# Patient Record
Sex: Female | Born: 1960 | Race: Black or African American | Hispanic: No | Marital: Single | Smoking: Never smoker
Health system: Southern US, Community
[De-identification: ages and names within clinical notes are randomized; demographics above are authoritative.]

## PROBLEM LIST (undated history)

## (undated) DIAGNOSIS — E785 Hyperlipidemia, unspecified: Secondary | ICD-10-CM

## (undated) DIAGNOSIS — G4733 Obstructive sleep apnea (adult) (pediatric): Secondary | ICD-10-CM

## (undated) DIAGNOSIS — I350 Nonrheumatic aortic (valve) stenosis: Secondary | ICD-10-CM

## (undated) DIAGNOSIS — N189 Chronic kidney disease, unspecified: Secondary | ICD-10-CM

## (undated) DIAGNOSIS — I639 Cerebral infarction, unspecified: Secondary | ICD-10-CM

## (undated) DIAGNOSIS — K819 Cholecystitis, unspecified: Secondary | ICD-10-CM

## (undated) DIAGNOSIS — I10 Essential (primary) hypertension: Secondary | ICD-10-CM

## (undated) DIAGNOSIS — E119 Type 2 diabetes mellitus without complications: Secondary | ICD-10-CM

## (undated) DIAGNOSIS — I429 Cardiomyopathy, unspecified: Secondary | ICD-10-CM

## (undated) DIAGNOSIS — M109 Gout, unspecified: Secondary | ICD-10-CM

## (undated) DIAGNOSIS — E669 Obesity, unspecified: Secondary | ICD-10-CM

## (undated) HISTORY — PX: ABDOMINAL HYSTERECTOMY: SHX81

## (undated) HISTORY — PX: TOTAL ABDOMINAL HYSTERECTOMY: SHX209

---

## 2001-05-19 ENCOUNTER — Emergency Department (HOSPITAL_COMMUNITY): Admission: EM | Admit: 2001-05-19 | Discharge: 2001-05-20 | Payer: Self-pay | Admitting: Emergency Medicine

## 2003-03-27 ENCOUNTER — Emergency Department (HOSPITAL_COMMUNITY): Admission: EM | Admit: 2003-03-27 | Discharge: 2003-03-27 | Payer: Self-pay

## 2003-11-02 ENCOUNTER — Emergency Department (HOSPITAL_COMMUNITY): Admission: EM | Admit: 2003-11-02 | Discharge: 2003-11-02 | Payer: Self-pay | Admitting: Emergency Medicine

## 2004-03-14 ENCOUNTER — Emergency Department (HOSPITAL_COMMUNITY): Admission: EM | Admit: 2004-03-14 | Discharge: 2004-03-15 | Payer: Self-pay | Admitting: *Deleted

## 2004-12-07 ENCOUNTER — Emergency Department (HOSPITAL_COMMUNITY): Admission: EM | Admit: 2004-12-07 | Discharge: 2004-12-07 | Payer: Self-pay | Admitting: Emergency Medicine

## 2005-01-03 ENCOUNTER — Ambulatory Visit: Payer: Self-pay | Admitting: Family Medicine

## 2005-02-15 ENCOUNTER — Ambulatory Visit: Payer: Self-pay | Admitting: Family Medicine

## 2005-05-08 ENCOUNTER — Ambulatory Visit: Payer: Self-pay | Admitting: Family Medicine

## 2005-05-13 ENCOUNTER — Ambulatory Visit (HOSPITAL_COMMUNITY): Admission: RE | Admit: 2005-05-13 | Discharge: 2005-05-13 | Payer: Self-pay | Admitting: Obstetrics & Gynecology

## 2005-06-12 ENCOUNTER — Ambulatory Visit (HOSPITAL_COMMUNITY): Admission: RE | Admit: 2005-06-12 | Discharge: 2005-06-12 | Payer: Self-pay | Admitting: Family Medicine

## 2005-07-11 ENCOUNTER — Emergency Department (HOSPITAL_COMMUNITY): Admission: EM | Admit: 2005-07-11 | Discharge: 2005-07-12 | Payer: Self-pay | Admitting: *Deleted

## 2005-07-12 ENCOUNTER — Inpatient Hospital Stay (HOSPITAL_COMMUNITY): Admission: EM | Admit: 2005-07-12 | Discharge: 2005-07-14 | Payer: Self-pay | Admitting: Emergency Medicine

## 2005-07-12 ENCOUNTER — Ambulatory Visit (HOSPITAL_COMMUNITY): Admission: RE | Admit: 2005-07-12 | Discharge: 2005-07-12 | Payer: Self-pay | Admitting: *Deleted

## 2005-07-12 ENCOUNTER — Encounter (INDEPENDENT_AMBULATORY_CARE_PROVIDER_SITE_OTHER): Payer: Self-pay | Admitting: General Surgery

## 2005-09-09 ENCOUNTER — Ambulatory Visit: Payer: Self-pay | Admitting: Family Medicine

## 2005-10-28 ENCOUNTER — Emergency Department (HOSPITAL_COMMUNITY): Admission: EM | Admit: 2005-10-28 | Discharge: 2005-10-28 | Payer: Self-pay | Admitting: Emergency Medicine

## 2005-12-19 ENCOUNTER — Ambulatory Visit: Payer: Self-pay | Admitting: Family Medicine

## 2006-01-16 ENCOUNTER — Ambulatory Visit: Payer: Self-pay | Admitting: Family Medicine

## 2006-03-31 ENCOUNTER — Ambulatory Visit: Payer: Self-pay | Admitting: Family Medicine

## 2006-04-10 ENCOUNTER — Emergency Department (HOSPITAL_COMMUNITY): Admission: EM | Admit: 2006-04-10 | Discharge: 2006-04-10 | Payer: Self-pay | Admitting: Emergency Medicine

## 2006-06-09 ENCOUNTER — Ambulatory Visit: Payer: Self-pay | Admitting: Family Medicine

## 2006-06-13 ENCOUNTER — Ambulatory Visit (HOSPITAL_COMMUNITY): Admission: RE | Admit: 2006-06-13 | Discharge: 2006-06-13 | Payer: Self-pay | Admitting: Family Medicine

## 2006-07-01 ENCOUNTER — Emergency Department (HOSPITAL_COMMUNITY): Admission: EM | Admit: 2006-07-01 | Discharge: 2006-07-01 | Payer: Self-pay | Admitting: Emergency Medicine

## 2006-07-31 ENCOUNTER — Ambulatory Visit: Payer: Self-pay | Admitting: Family Medicine

## 2006-07-31 ENCOUNTER — Other Ambulatory Visit: Admission: RE | Admit: 2006-07-31 | Discharge: 2006-07-31 | Payer: Self-pay | Admitting: Family Medicine

## 2006-07-31 ENCOUNTER — Encounter: Payer: Self-pay | Admitting: Family Medicine

## 2006-07-31 LAB — CONVERTED CEMR LAB: Pap Smear: NORMAL

## 2006-10-01 ENCOUNTER — Ambulatory Visit: Payer: Self-pay | Admitting: Family Medicine

## 2007-07-04 ENCOUNTER — Emergency Department (HOSPITAL_COMMUNITY): Admission: EM | Admit: 2007-07-04 | Discharge: 2007-07-04 | Payer: Self-pay | Admitting: Emergency Medicine

## 2007-08-20 ENCOUNTER — Encounter: Payer: Self-pay | Admitting: Family Medicine

## 2007-11-25 ENCOUNTER — Ambulatory Visit: Payer: Self-pay | Admitting: Family Medicine

## 2007-11-30 ENCOUNTER — Encounter: Payer: Self-pay | Admitting: Family Medicine

## 2007-11-30 DIAGNOSIS — E119 Type 2 diabetes mellitus without complications: Secondary | ICD-10-CM | POA: Insufficient documentation

## 2007-11-30 DIAGNOSIS — E785 Hyperlipidemia, unspecified: Secondary | ICD-10-CM | POA: Insufficient documentation

## 2007-11-30 DIAGNOSIS — E669 Obesity, unspecified: Secondary | ICD-10-CM | POA: Insufficient documentation

## 2007-11-30 DIAGNOSIS — I10 Essential (primary) hypertension: Secondary | ICD-10-CM

## 2009-11-16 ENCOUNTER — Telehealth: Payer: Self-pay | Admitting: Family Medicine

## 2010-04-19 ENCOUNTER — Ambulatory Visit: Payer: Self-pay | Admitting: Cardiovascular Disease

## 2010-04-19 ENCOUNTER — Ambulatory Visit (HOSPITAL_COMMUNITY): Admission: RE | Admit: 2010-04-19 | Discharge: 2010-04-19 | Payer: Self-pay | Admitting: Gastroenterology

## 2010-09-08 ENCOUNTER — Encounter: Payer: Self-pay | Admitting: Family Medicine

## 2010-09-09 ENCOUNTER — Encounter: Payer: Self-pay | Admitting: Family Medicine

## 2010-09-18 NOTE — Letter (Signed)
Summary: RPC chart  RPC chart   Imported By: Curtis Sites 03/27/2010 11:31:48  _____________________________________________________________________  External Attachment:    Type:   Image     Comment:   External Document

## 2010-09-18 NOTE — Progress Notes (Signed)
Summary: refill  Phone Note Call from Patient   Summary of Call: would like to get refills on all her meds. [facility] Initial call taken by: Rudene Anda,  November 16, 2009 9:16 AM  Follow-up for Phone Call        patient's number has been disconnected, cannot refill any meds until patient is seen in office, it has been over a year since she was seen last Follow-up by: Adella Hare LPN,  November 16, 2009 10:02 AM

## 2011-01-04 NOTE — Discharge Summary (Signed)
NAME:  Crystal Snow, RIVIERE NO.:  [id number]   MEDICAL RECORD NO.:  [id number]          PATIENT TYPE:  INP   LOCATION:  A327                          FACILITY:  [facility]   PHYSICIAN:  Annia Friendly. Loleta Chance, MD     DATE OF BIRTH:  1961/05/26   DATE OF ADMISSION:  07/12/2005  DATE OF DISCHARGE:  11/26/2006LH                                 DISCHARGE SUMMARY   HISTORY OF PRESENT ILLNESS:  The patient was a 50 year old single, gravida  0, employee of [employer] black female from [place], [place].  She  is complaining of mid stomach pain.  History was positive for stomach pain  x24 hours confined to upper stomach.  The patient was described as sharp,  and it did not radiate through back or to shoulders.  The patient also  denied radiation of pain to chest.  The pain started around 0800 while in  bed on July 11, 2005.  The pain increased in severity around 1400 on  July 11, 2005.  The patient incurred vomiting at 1400 (vomiting was  described as orange:  The patient had eaten cheese puffs earlier).  History  was also positive for diaphoresis with vomiting and general malaise.  The  patient came to the emergency room for evaluation around 8 p.m. on July 11, 2005.  The patient was treated in the emergency room with IM medication  and IV fluids.  She was scheduled for ultrasound of her abdomen on the  morning of July 12, 2005.  Ultrasound was read as abnormal gallbladder  wall thickness, 5.5 mm stone and a Murphy's sign consistent with acute  cholecystitis, fatty liver and no biliary dilatation.   PAST MEDICAL HISTORY:  Hypertension.   ALLERGIES:  No known drug allergies.   HABITS:  Negative for tobacco, ethanol and street drugs.   PAST SURGICAL HISTORY:  Hysterectomy approximately 10 years ago secondary to  fibroids.   FAMILY HISTORY:  Mother living at age 27 with history of diabetes mellitus  and hypertension.  Father deceased at age 33 secondary to  complications of  brain disorder.  Three sisters living at age 36 good health, age 24 history  of hypertension, age 54 with history of hypertension.  Four brothers living  at age 75 good healthy, 22 good health, age 3 good health, age 42 good  health.   PROBLEMS:  Acute abdominal pain secondary to acute cholecystitis and  cholelithiasis.   PHYSICAL EXAMINATION:  GENERAL APPEARANCE:  A middle aged, overweight,  medium height, black female in no apparent respiratory distress.  VITAL SIGNS:  Temperature 98.1, pulse 78, respirations 20, blood pressure  126/73.  HEENT:  Sclerae were white.  LUNGS:  Clear.  HEART:  Audible S1, S2 without murmur.  Rhythm was regular in rate within  normal limits.  ABDOMEN:  Obese with hypoactive bowel sounds.  Old healed mid hypogastric  surgical scar.  Soft with positive mid epigastric tenderness and right upper  quadrant tenderness on palpation.  No palpable mass or organomegaly.   LABORATORY DATA:  On admission, white count 12,400, hemoglobin 12.9,  hematocrit 37.5, platelets 372,000.  Sodium 136, potassium 2.9, chloride  102, CO2 27, glucose 112, BUN 6, creatinine 1.0, calcium 8.8, serum HCG  negative.   PHYSICIAN:  The patient was evaluated by Dr. Sherryl Barters on the day of  admission.   PROBLEMS:  #1 - CHOLECYSTITIS AND CHOLELITHIASIS.  The patient had surgery  performed on July 12, 2005, by Dr. Maggie Schwalbe (laparoscopic  cholecystectomy) without complications.  The patient's postoperative course  was benign.  She was tolerating a regular diet at the time of discharge.  Her wound site demonstrated no active drainage.  She was alert and oriented  to person, place and time at time of discharge.  She was discharged to her  home on July 14, 2005.   #2 - HYPOKALEMIA.  Serum potassium on admission was 2.9.  Other electrolytes  on admission were as follows:  Sodium 136, chloride 102, CO2 27.  BUN 6,  creatinine 1.0.  The patient was treated with  IV potassium initially on  admission.  Repeat serum potassium was 3.6 on July 13, 2005.   #3 - HYPERTENSION.  Blood pressure on admission was 126/73.  Heart exam  revealed audible S1, S2 without murmur.  Rhythm was regular and rate within  normal limits.  The patient was treated with low sodium diet.  Antihypertensive medication was not required during this hospitalization.  However, the patient's antihypertensive medication will be restarted at the  discretion of her family doctor.  Last blood pressure reading on July 14, 2005, was 117/77.  Heart exam was within normal limits.  Lungs were  clear.  Extremities demonstrated no edema.  The patient had no complaining  of chest pain or shortness of breath.   #4 - ACUTE URINARY TRACT INFECTION SECONDARY TO E. COLI.  The patient was on  antibiotics (Levaquin) during this hospitalization.  The organisms were  sensitive to Levaquin.  The patient was discharged home on antibiotics based  on sensitivities.   DISCHARGE INSTRUCTIONS:  1.  Diet:  No added salt.  2.  Activity:  No driving x1 week.  No lifting x2 weeks.   MEDICATIONS:  1.  Ampicillin 500 mg one tablet q.i.d.  2.  Metoclopramide 10 mg one tablet 30 minutes before meals and bedtime.  3.  Acetaminophen/codeine #3 two tablets q.4-6 h p.r.n. pain.  4.  Potassium chloride  20 mEq one tablet p.o. daily.   FOLLOWUP:  Follow up in the office in two weeks.   FINAL PRIMARY DIAGNOSES:  1.  Acute abdominal pain secondary to cholecystitis and cholelithiasis.  2.  Hypokalemia.  3.  Acute urinary tract infection.   SECONDARY DIAGNOSIS:  Hypertension.      Annia Friendly. Loleta Chance, MD  Electronically Signed     GKH/MEDQ  D:  07/15/2005  T:  07/15/2005  Job:  [id number]

## 2011-01-04 NOTE — Op Note (Signed)
NAME:  Crystal Snow, PITZER         ACCOUNT NO.:  [id number]   MEDICAL RECORD NO.:  [id number]          PATIENT TYPE:  INP   LOCATION:  A327                          FACILITY:  [facility]   PHYSICIAN:  Dirk Dress. Katrinka Blazing, M.D.   DATE OF BIRTH:  [birth year]   DATE OF PROCEDURE:  DATE OF DISCHARGE:                                 OPERATIVE REPORT   PREOPERATIVE DIAGNOSIS:  Acute cholecystitis/cholelithiasis.   POSTOPERATIVE DIAGNOSIS:  Acute cholecystitis/cholelithiasis.   PROCEDURE:  Laparoscopic cholecystectomy.   SURGEON:  Dirk Dress. Katrinka Blazing, M.D.   DESCRIPTION:  Under general anesthesia, the patient's abdomen was prepped  and draped in a sterile field.  Examination of the abdomen revealed that a  midline incision extended above the umbilicus.  A Varies needle was  inserted, but there was some concern there was viscera beneath the fascia.  The fascia was therefore opened in the midline, and a blunt trocar was  placed.  The abdomen was insufflated with 4 liters of CO2.  Laparoscope was  placed.  An acutely inflamed, thickened gallbladder was noted.  The patient  was placed in reverse Trendelenburg position.  Under videoscopic guidance, a  10 mm port and two 5 mm ports were placed in the right subcostal region.  The gallbladder was grasped in position.  The cystic duct and cystic  arteries were dissected.  There was a small anterior cystic artery that was  dissected, clipped close to the gallbladder and divided.  The cystic duct  was thickened, and it was followed directly to the gallbladder, where in the  infundibulum of the gallbladder, there was a large stone obstructing the  cystic duct.  The dissection was carried up to this point.  The duct was  clipped with five clips and divided.  There was a large cystic artery branch  posteriorly.  It was followed to the posterior wall of the gallbladder.  There was no smaller branch that went to the gallbladder.  It was clipped  close to the  gallbladder with four clips and divided.  The gallbladder was  then placed on the stretch.  The wall of the gallbladder was extremely  edematous.  Using electrocautery, the gallbladder was then separated from  the intrahepatic space with some difficulty.  Because of drainage, it was  felt that the area would need to be drained.  There was some bleeding in the  bed that was cauterized, then clamped.  Irrigation was carried out.  The  gallbladder was placed in an EndoCatch device and retrieved.  Once this was  done, the right upper quadrant medial port was closed with an EndoClose  device using 0  Vicryl.  The supraumbilical port was closed with interrupted 0 Vicryl.  Skin  was closed with staples.  Dressings were placed.  Patient tolerated the  procedure well.  She was awakened from anesthesia and transferred to a bed  and taken to the post anesthesia care unit for further monitoring.      Dirk Dress. Katrinka Blazing, M.D.  Electronically Signed     LCS/MEDQ  D:  07/12/2005  T:  07/12/2005  Job:  [id number]  cc:   Milus Mallick. Lodema Hong, M.D.  Fax: [phone]

## 2011-01-04 NOTE — H&P (Signed)
NAME:  Crystal Snow, Crystal Snow NO.:  [id number]   MEDICAL RECORD NO.:  [id number]          PATIENT TYPE:  INP   LOCATION:  A327                          FACILITY:  [facility]   PHYSICIAN:  Annia Friendly. Loleta Chance, MD     DATE OF BIRTH:  1961/08/18   DATE OF ADMISSION:  07/12/2005  DATE OF DISCHARGE:  LH                                HISTORY & PHYSICAL   IDENTIFYING DATA:  The patient is a 50 year old, single, gravida 0, employee  of [employer], black female from [place], [place].   CHIEF COMPLAINT:  Mid upper stomach pain x24 hours.   HISTORY OF PRESENT ILLNESS:  The pain is confined to the upper stomach.  The  pain is described as sharp and it does not radiate through back to shoulders  or to chest.  The pain started around 0800 while in bed on July 11, 2005.  The pain increased in severity around 1400 on July 11, 2005.  The  patient incurred vomiting also at 1400 (vomitus is described as orange, the  patient ate cheese puff earlier) greater than five.  History is also  positive for diaphoresis with vomiting and general malaise.  The patient  came to the emergency room for evaluation around 8 p.m. on July 11, 2005.  The patient was treated in the emergency room with IM medication and  IV fluids.  She was scheduled for an ultrasound of her abdomen on the  morning of July 12, 2005.  Ultrasound abdomen was read as abnormal  gallbladder wall thickness, a 5.5-mm stones and a Murphy sign consistent  with acute cholecystitis, fatty liver, no biliary dilatation.   MEDICAL HISTORY:  Positive for hypertension.  Medical history is negative  for diabetes, tuberculosis, cancer, sickle cell, asthma, seizure disorder.   PRESCRIBED MEDICATIONS:  On admission, Triamterene/HCTZ 75/50, one tablet  p.o. every day.   The patient is not allergic to any known medications.   HABITS:  Negative for tobacco, ethanol, and street drugs.   PAST MEDICAL HISTORY:  Positive  hospitalization for hysterectomy  approximately ten years ago secondary to fibroids.   FAMILY HISTORY:  Revealed mother living, age 80, with a history of diabetes  mellitus and hypertension.  Father deceased, age 7, secondary to  complications of brain disorder.  Three sisters living, age 61 - good  health, age 70 with a history of hypertension, and age 71 with a history of  hypertension.  Four brothers living, age 63 - good health, age 85 - good  health, age 30 - good health, and age 48 - good health.   REVIEW OF SYSTEMS:  Positive for chronic left ankle pain and increased  frequency of urination less than 24 hours without pain.  Review of systems  is negative for headache, epistaxis, chest pain, shortness of breath,  dysphagia, chronic cough, hemoptysis, edema of the legs, melena,  hematochezia, weight loss, dysphagia, palpitations, vaginal discharge,  vaginal itching, and vaginal bleeding.   PHYSICAL EXAMINATION:  GENERAL APPEARANCE:  Revealed a middle-aged,  overweight, medium height, black female in no apparent respiratory distress.  VITAL SIGNS:  Temperature is  98.1, pulse 78, respirations 20, blood pressure  126/73.  HEENT:  Head:  Normocephalic.  Ears:  Normal auricles.  External canals  patent.  Tympanic membranes pearly gray.  Eyes:  Lids negative for ptosis.  Sclerae are white.  Pupils round, equal, and reactive to light.  Extraocular  movement intact.  Nose:  Negative for discharge.  Mouth:  Positive missing  teeth.  Remaining dentition fair with cavity.  No bleeding gums.  No oral  lesions.  Posterior pharynx benign.  NECK:  Negative for lymphadenopathy or thyromegaly.  Supraclavicular space:  No palpable nodes.  LUNGS:  Clear.  HEART:  Audible S1 S2 without murmur.  Regular rate and rhythm.  BREASTS:  Large.  No skin changes.  No nodules on palpation.  Nipple erect  without discharge.  Inferior aspect of breast positive for mild maceration  bilaterally without  erythema.  ABDOMEN:  Obese, hypoactive bowel sounds.  Positive old healed mid  hypogastric surgical scar.  Soft.  Positive for mid epigastric and right  upper quadrant tenderness.  No palpable mass or organomegaly.  PELVIC:  Deferred.  RECTAL:  Deferred.  EXTREMITIES:  No edema.  No joint swelling.  No joint redness.  No joint  hotness.  No tibial edema.  Palpable dorsalis pedis bilaterally.  NEUROLOGIC:  Alert and oriented  to person, place and time.  Cranial nerves  II-XII appeared intact.   LABORATORY:  White count 12,400, hemoglobin 12.9, hematocrit 37.5, platelets  372,000.  Sodium 136, potassium 2.9, chloride 102, CO2 27, glucose 112, BUN  6, creatinine 1.0, calcium 8.8.  Serum HCG negative.   IMPRESSION:  1.  Primary abdominal pain secondary to cholelithiasis and cholecystitis.  2.  Hypokalemia.   SECONDARY DIAGNOSES:  1.  Hypertension.  2.  Surgical menopause.   PLAN:  1.  IV fluids.  2.  NPO.  3.  Potassium supplement IV.  4.  Analgesia for pain.  5.  Removal of gallbladder by Dr. Maggie Schwalbe, after evaluation by him.  6.  Urinalysis.  7.  Other supportive measures.  8.  Antihypertensive medication as needed.      Annia Friendly. Loleta Chance, MD  Electronically Signed    GKH/MEDQ  D:  07/12/2005  T:  07/12/2005  Job:  [id number]

## 2012-04-01 ENCOUNTER — Other Ambulatory Visit (HOSPITAL_COMMUNITY): Payer: Self-pay | Admitting: Internal Medicine

## 2012-04-01 DIAGNOSIS — Z139 Encounter for screening, unspecified: Secondary | ICD-10-CM

## 2012-04-06 ENCOUNTER — Ambulatory Visit (HOSPITAL_COMMUNITY): Payer: Self-pay

## 2012-07-06 ENCOUNTER — Ambulatory Visit (HOSPITAL_COMMUNITY)
Admission: RE | Admit: 2012-07-06 | Discharge: 2012-07-06 | Disposition: A | Discharge status: Home / Self Care | Payer: BC Managed Care – PPO | Source: Ambulatory Visit | Attending: Internal Medicine | Admitting: Internal Medicine

## 2012-07-06 DIAGNOSIS — Z1231 Encounter for screening mammogram for malignant neoplasm of breast: Secondary | ICD-10-CM | POA: Insufficient documentation

## 2012-07-06 DIAGNOSIS — Z139 Encounter for screening, unspecified: Secondary | ICD-10-CM

## 2013-09-21 ENCOUNTER — Encounter: Payer: Self-pay | Admitting: *Deleted

## 2013-09-21 ENCOUNTER — Other Ambulatory Visit: Payer: Self-pay | Admitting: Obstetrics & Gynecology

## 2014-03-18 ENCOUNTER — Emergency Department (HOSPITAL_COMMUNITY)
Admission: EM | Admit: 2014-03-18 | Discharge: 2014-03-18 | Disposition: A | Discharge status: Home / Self Care | Payer: BC Managed Care – PPO | Attending: Emergency Medicine | Admitting: Emergency Medicine

## 2014-03-18 ENCOUNTER — Encounter (HOSPITAL_COMMUNITY): Payer: Self-pay | Admitting: Emergency Medicine

## 2014-03-18 ENCOUNTER — Emergency Department (HOSPITAL_COMMUNITY): Payer: BC Managed Care – PPO

## 2014-03-18 DIAGNOSIS — E669 Obesity, unspecified: Secondary | ICD-10-CM | POA: Insufficient documentation

## 2014-03-18 DIAGNOSIS — R209 Unspecified disturbances of skin sensation: Secondary | ICD-10-CM | POA: Insufficient documentation

## 2014-03-18 DIAGNOSIS — E876 Hypokalemia: Secondary | ICD-10-CM | POA: Insufficient documentation

## 2014-03-18 DIAGNOSIS — Z8673 Personal history of transient ischemic attack (TIA), and cerebral infarction without residual deficits: Secondary | ICD-10-CM | POA: Insufficient documentation

## 2014-03-18 DIAGNOSIS — E119 Type 2 diabetes mellitus without complications: Secondary | ICD-10-CM | POA: Insufficient documentation

## 2014-03-18 DIAGNOSIS — I10 Essential (primary) hypertension: Secondary | ICD-10-CM | POA: Insufficient documentation

## 2014-03-18 HISTORY — DX: Type 2 diabetes mellitus without complications: E11.9

## 2014-03-18 HISTORY — DX: Essential (primary) hypertension: I10

## 2014-03-18 LAB — CBC WITH DIFFERENTIAL/PLATELET
BASOS PCT: 1 % (ref 0–1)
Basophils Absolute: 0 10*3/uL (ref 0.0–0.1)
Eosinophils Absolute: 0.1 10*3/uL (ref 0.0–0.7)
Eosinophils Relative: 2 % (ref 0–5)
HCT: 37.9 % (ref 36.0–46.0)
Hemoglobin: 12.6 g/dL (ref 12.0–15.0)
Lymphocytes Relative: 48 % — ABNORMAL HIGH (ref 12–46)
Lymphs Abs: 3.5 10*3/uL (ref 0.7–4.0)
MCH: 26.8 pg (ref 26.0–34.0)
MCHC: 33.2 g/dL (ref 30.0–36.0)
MCV: 80.6 fL (ref 78.0–100.0)
MONO ABS: 0.3 10*3/uL (ref 0.1–1.0)
Monocytes Relative: 5 % (ref 3–12)
NEUTROS PCT: 44 % (ref 43–77)
Neutro Abs: 3.2 10*3/uL (ref 1.7–7.7)
PLATELETS: 292 10*3/uL (ref 150–400)
RBC: 4.7 MIL/uL (ref 3.87–5.11)
RDW: 14.3 % (ref 11.5–15.5)
WBC: 7.2 10*3/uL (ref 4.0–10.5)

## 2014-03-18 LAB — COMPREHENSIVE METABOLIC PANEL
ALBUMIN: 3.6 g/dL (ref 3.5–5.2)
ALK PHOS: 66 U/L (ref 39–117)
ALT: 16 U/L (ref 0–35)
AST: 17 U/L (ref 0–37)
Anion gap: 15 (ref 5–15)
BUN: 10 mg/dL (ref 6–23)
CALCIUM: 9 mg/dL (ref 8.4–10.5)
CO2: 27 mEq/L (ref 19–32)
Chloride: 101 mEq/L (ref 96–112)
Creatinine, Ser: 0.73 mg/dL (ref 0.50–1.10)
GFR calc Af Amer: >90 mL/min (ref >90)
GFR calc non Af Amer: >90 mL/min (ref >90)
GLUCOSE: 141 mg/dL — AB (ref 70–99)
POTASSIUM: 2.9 meq/L — AB (ref 3.7–5.3)
SODIUM: 143 meq/L (ref 137–147)
TOTAL PROTEIN: 7.5 g/dL (ref 6.0–8.3)
Total Bilirubin: 0.2 mg/dL — ABNORMAL LOW (ref 0.3–1.2)

## 2014-03-18 MED ORDER — POTASSIUM CHLORIDE CRYS ER 20 MEQ PO TBCR
40.0000 meq | EXTENDED_RELEASE_TABLET | Freq: Once | ORAL | Status: AC
  Administered 2014-03-18: 40 meq via ORAL
  Filled 2014-03-18: qty 2

## 2014-03-18 MED ORDER — POTASSIUM CHLORIDE CRYS ER 20 MEQ PO TBCR: 20.0000 meq | EXTENDED_RELEASE_TABLET | Freq: Every day | ORAL | Status: DC

## 2014-03-18 NOTE — ED Notes (Signed)
CRITICAL VALUE ALERT  Critical value received:  Potassium  Date of notification: 03/18/14  Time of notification: 1447 Critical value read back:Yes.    Nurse who received alert: Delila Pereyra MD notified (1st page):  Zammitt        Responding MD: Zammitt

## 2014-03-18 NOTE — ED Provider Notes (Signed)
CSN: [id number]     Arrival date & time 03/18/14  1233 History  This chart was scribed for Benny LennertJoseph L Irven Ingalsbe, MD by Milly JakobJohn Lee Graves, ED Scribe. The patient was seen in room APA08/APA08. Patient's care was started at 1:28 PM.     Chief Complaint  Patient presents with  . Numbness  . Hypertension   Patient is a 53 y.o. female presenting with hypertension. The history is provided by the patient. No language interpreter was used.  Hypertension This is a recurrent (Numbness: 5 days 1x per day) problem. The current episode started 3 to 5 hours ago (Numbness:). The problem occurs daily (Numbness:). The problem has not changed (Numbness:) since onset.Pertinent negatives include no chest pain, no abdominal pain, no headaches and no shortness of breath. Nothing relieves the symptoms. She has tried nothing for the symptoms.   HPI Comments: Crystal Snow is a 53 y.o. female with a history of HTN who presents to the Emergency Department complaining of numbness down the left side of her body from her head to her feet that lasted about 15 minutes, onset earlier today. She has been having these symptoms intermittently, in the morning, at work for 5 days. She reports associated weakness in her left arm and leg intermittently for 5 days.   Past Medical History  Diagnosis Date  . Hypertension   . Diabetes mellitus without complication    Past Surgical History  Procedure Laterality Date  . Abdominal hysterectomy     History reviewed. No pertinent family history. History  Substance Use Topics  . Smoking status: Never Smoker   . Smokeless tobacco: Not on file  . Alcohol Use: No   OB History   Grav Para Term Preterm Abortions TAB SAB Ect Mult Living                 Review of Systems  Constitutional: Negative for appetite change and fatigue.  HENT: Negative for congestion, ear discharge and sinus pressure.   Eyes: Negative for discharge.  Respiratory: Negative for cough and shortness of breath.    Cardiovascular: Negative for chest pain.  Gastrointestinal: Negative for abdominal pain and diarrhea.  Genitourinary: Negative for frequency and hematuria.  Musculoskeletal: Negative for back pain.  Skin: Negative for rash.  Neurological: Positive for numbness. Negative for seizures and headaches.  Psychiatric/Behavioral: Negative for hallucinations.      Allergies  Review of patient's allergies indicates no known allergies.  Home Medications   Prior to Admission medications   Not on File   Triage Vitals: BP 188/97  Pulse 106  Temp(Src) 98.9 F (37.2 C) (Oral)  Resp 18  Ht 5\' 2"  (1.575 m)  Wt 210 lb (95.255 kg)  BMI 38.40 kg/m2  SpO2 100% Physical Exam  Constitutional: She is oriented to person, place, and time. She appears well-developed.  Obesity.  HENT:  Head: Normocephalic.  Eyes: Conjunctivae and EOM are normal. No scleral icterus.  Neck: Neck supple. No thyromegaly present.  Cardiovascular: Normal rate and regular rhythm.  Exam reveals no gallop and no friction rub.   No murmur heard. Pulmonary/Chest: No stridor. She has no wheezes. She has no rales. She exhibits no tenderness.  Abdominal: She exhibits no distension. There is no tenderness. There is no rebound.  Musculoskeletal: Normal range of motion. She exhibits no edema.  Lymphadenopathy:    She has no cervical adenopathy.  Neurological: She is oriented to person, place, and time. She exhibits normal muscle tone. Coordination normal.  Skin: No  rash noted. No erythema.  Psychiatric: She has a normal mood and affect. Her behavior is normal.    ED Course  Procedures (including critical care time) DIAGNOSTIC STUDIES: Oxygen Saturation is 100% on room air, normal by my interpretation.    COORDINATION OF CARE: 1:33 PM-Discussed treatment plan with pt at bedside and pt agreed to plan.   Labs Review Labs Reviewed - No data to display  Imaging Review No results found.   EKG  Interpretation   Date/Time:  Friday March 18 2014 12:48:47 EDT Ventricular Rate:  93 PR Interval:  199 QRS Duration: 103 QT Interval:  389 QTC Calculation: 484 R Axis:   52 Text Interpretation:  Sinus rhythm Borderline prolonged PR interval  Probable left atrial enlargement Borderline repolarization abnormality No  old tracing to compare Confirmed by Ethelda Chick  MD, SAM [phone]) on  03/18/2014 1:06:14 PM      MDM   Final diagnoses:  None    Hypokalemia,  Old stroke,   tx with potassium.  Asa and follow up The chart was scribed for me under my direct supervision.  I personally performed the history, physical, and medical decision making and all procedures in the evaluation of this patient.Benny Lennert, MD 03/18/14 [phone]

## 2014-03-18 NOTE — ED Notes (Signed)
Pt reports she has been having numbness/tingling on the left side "it comes and goes" x1 week. Last episode was at work today and lasted approximately 5 min per pt.

## 2014-03-18 NOTE — Discharge Instructions (Signed)
Take one aspirin a day and follow up with your md next week

## 2014-03-24 ENCOUNTER — Inpatient Hospital Stay (HOSPITAL_COMMUNITY): Payer: BC Managed Care – PPO

## 2014-03-24 ENCOUNTER — Encounter (HOSPITAL_COMMUNITY): Payer: Self-pay | Admitting: Emergency Medicine

## 2014-03-24 ENCOUNTER — Observation Stay (HOSPITAL_COMMUNITY): Payer: BC Managed Care – PPO

## 2014-03-24 ENCOUNTER — Ambulatory Visit (HOSPITAL_COMMUNITY)
Admission: RE | Admit: 2014-03-24 | Discharge: 2014-03-24 | Disposition: A | Discharge status: Home / Self Care | Payer: BC Managed Care – PPO | Source: Ambulatory Visit | Attending: Family Medicine | Admitting: Family Medicine

## 2014-03-24 ENCOUNTER — Other Ambulatory Visit: Payer: Self-pay

## 2014-03-24 ENCOUNTER — Encounter (HOSPITAL_COMMUNITY): Payer: Self-pay

## 2014-03-24 ENCOUNTER — Other Ambulatory Visit (HOSPITAL_COMMUNITY): Payer: Self-pay | Admitting: Family Medicine

## 2014-03-24 ENCOUNTER — Observation Stay (HOSPITAL_COMMUNITY)
Admission: EM | Admit: 2014-03-24 | Discharge: 2014-03-25 | Disposition: A | Discharge status: Home / Self Care | Payer: BC Managed Care – PPO | Attending: Family Medicine | Admitting: Family Medicine

## 2014-03-24 DIAGNOSIS — R29898 Other symptoms and signs involving the musculoskeletal system: Secondary | ICD-10-CM

## 2014-03-24 DIAGNOSIS — Z79899 Other long term (current) drug therapy: Secondary | ICD-10-CM | POA: Insufficient documentation

## 2014-03-24 DIAGNOSIS — I6350 Cerebral infarction due to unspecified occlusion or stenosis of unspecified cerebral artery: Secondary | ICD-10-CM | POA: Diagnosis not present

## 2014-03-24 DIAGNOSIS — Z8673 Personal history of transient ischemic attack (TIA), and cerebral infarction without residual deficits: Secondary | ICD-10-CM | POA: Diagnosis present

## 2014-03-24 DIAGNOSIS — E669 Obesity, unspecified: Secondary | ICD-10-CM | POA: Diagnosis present

## 2014-03-24 DIAGNOSIS — R209 Unspecified disturbances of skin sensation: Secondary | ICD-10-CM | POA: Insufficient documentation

## 2014-03-24 DIAGNOSIS — R200 Anesthesia of skin: Secondary | ICD-10-CM

## 2014-03-24 DIAGNOSIS — E876 Hypokalemia: Secondary | ICD-10-CM | POA: Diagnosis present

## 2014-03-24 DIAGNOSIS — E785 Hyperlipidemia, unspecified: Secondary | ICD-10-CM | POA: Diagnosis not present

## 2014-03-24 DIAGNOSIS — E66813 Obesity, class 3: Secondary | ICD-10-CM | POA: Diagnosis present

## 2014-03-24 DIAGNOSIS — E119 Type 2 diabetes mellitus without complications: Secondary | ICD-10-CM | POA: Diagnosis present

## 2014-03-24 DIAGNOSIS — R197 Diarrhea, unspecified: Secondary | ICD-10-CM | POA: Insufficient documentation

## 2014-03-24 DIAGNOSIS — I639 Cerebral infarction, unspecified: Secondary | ICD-10-CM | POA: Diagnosis present

## 2014-03-24 DIAGNOSIS — I10 Essential (primary) hypertension: Secondary | ICD-10-CM | POA: Diagnosis present

## 2014-03-24 HISTORY — DX: Cerebral infarction, unspecified: I63.9

## 2014-03-24 HISTORY — DX: Cholecystitis, unspecified: K81.9

## 2014-03-24 LAB — DIFFERENTIAL
BASOS ABS: 0 10*3/uL (ref 0.0–0.1)
Basophils Relative: 1 % (ref 0–1)
EOS ABS: 0.1 10*3/uL (ref 0.0–0.7)
Eosinophils Relative: 2 % (ref 0–5)
Lymphocytes Relative: 51 % — ABNORMAL HIGH (ref 12–46)
Lymphs Abs: 3.6 10*3/uL (ref 0.7–4.0)
Monocytes Absolute: 0.4 10*3/uL (ref 0.1–1.0)
Monocytes Relative: 5 % (ref 3–12)
NEUTROS PCT: 41 % — AB (ref 43–77)
Neutro Abs: 2.8 10*3/uL (ref 1.7–7.7)

## 2014-03-24 LAB — GLUCOSE, CAPILLARY
Glucose-Capillary: 109 mg/dL — ABNORMAL HIGH (ref 70–99)
Glucose-Capillary: 129 mg/dL — ABNORMAL HIGH (ref 70–99)

## 2014-03-24 LAB — COMPREHENSIVE METABOLIC PANEL
ALBUMIN: 3.5 g/dL (ref 3.5–5.2)
ALT: 17 U/L (ref 0–35)
AST: 20 U/L (ref 0–37)
Alkaline Phosphatase: 62 U/L (ref 39–117)
Anion gap: 12 (ref 5–15)
BUN: 12 mg/dL (ref 6–23)
CO2: 29 mEq/L (ref 19–32)
Calcium: 9.3 mg/dL (ref 8.4–10.5)
Chloride: 99 mEq/L (ref 96–112)
Creatinine, Ser: 0.75 mg/dL (ref 0.50–1.10)
GFR calc Af Amer: >90 mL/min (ref >90)
GFR calc non Af Amer: >90 mL/min (ref >90)
Glucose, Bld: 170 mg/dL — ABNORMAL HIGH (ref 70–99)
POTASSIUM: 2.9 meq/L — AB (ref 3.7–5.3)
SODIUM: 140 meq/L (ref 137–147)
TOTAL PROTEIN: 7.5 g/dL (ref 6.0–8.3)
Total Bilirubin: 0.2 mg/dL — ABNORMAL LOW (ref 0.3–1.2)

## 2014-03-24 LAB — LIPID PANEL
CHOL/HDL RATIO: 3.4 ratio
CHOLESTEROL: 196 mg/dL (ref 0–200)
HDL: 58 mg/dL (ref >39)
LDL Cholesterol: 100 mg/dL — ABNORMAL HIGH (ref 0–99)
TRIGLYCERIDES: 189 mg/dL — AB (ref <150)
VLDL: 38 mg/dL (ref 0–40)

## 2014-03-24 LAB — APTT: aPTT: 29 seconds (ref 24–37)

## 2014-03-24 LAB — URINALYSIS, ROUTINE W REFLEX MICROSCOPIC
Bilirubin Urine: NEGATIVE
GLUCOSE, UA: NEGATIVE mg/dL
HGB URINE DIPSTICK: NEGATIVE
Ketones, ur: NEGATIVE mg/dL
Leukocytes, UA: NEGATIVE
Nitrite: NEGATIVE
PH: 5.5 (ref 5.0–8.0)
Protein, ur: 30 mg/dL — AB
Specific Gravity, Urine: 1.025 (ref 1.005–1.030)
UROBILINOGEN UA: 0.2 mg/dL (ref 0.0–1.0)

## 2014-03-24 LAB — URINE MICROSCOPIC-ADD ON

## 2014-03-24 LAB — CBC
HCT: 38.1 % (ref 36.0–46.0)
Hemoglobin: 12.7 g/dL (ref 12.0–15.0)
MCH: 26.7 pg (ref 26.0–34.0)
MCHC: 33.3 g/dL (ref 30.0–36.0)
MCV: 80 fL (ref 78.0–100.0)
PLATELETS: 319 10*3/uL (ref 150–400)
RBC: 4.76 MIL/uL (ref 3.87–5.11)
RDW: 14.3 % (ref 11.5–15.5)
WBC: 6.9 10*3/uL (ref 4.0–10.5)

## 2014-03-24 LAB — PROTIME-INR
INR: 0.97 (ref 0.00–1.49)
Prothrombin Time: 12.9 seconds (ref 11.6–15.2)

## 2014-03-24 LAB — TROPONIN I: Troponin I: <0.3 ng/mL (ref <0.30)

## 2014-03-24 MED ORDER — POTASSIUM CHLORIDE 10 MEQ/100ML IV SOLN
10.0000 meq | INTRAVENOUS | Status: AC
  Administered 2014-03-24 (×2): 10 meq via INTRAVENOUS
  Filled 2014-03-24 (×2): qty 100

## 2014-03-24 MED ORDER — INSULIN ASPART 100 UNIT/ML ~~LOC~~ SOLN: 0.0000 [IU] | Freq: Every day | SUBCUTANEOUS | Status: DC

## 2014-03-24 MED ORDER — INSULIN ASPART 100 UNIT/ML ~~LOC~~ SOLN
0.0000 [IU] | Freq: Three times a day (TID) | SUBCUTANEOUS | Status: DC
  Administered 2014-03-25: 3 [IU] via SUBCUTANEOUS

## 2014-03-24 MED ORDER — ACETAMINOPHEN 325 MG PO TABS: 650.0000 mg | ORAL_TABLET | ORAL | Status: DC | PRN

## 2014-03-24 MED ORDER — ASPIRIN 325 MG PO TABS
325.0000 mg | ORAL_TABLET | Freq: Every day | ORAL | Status: DC
  Administered 2014-03-24 – 2014-03-25 (×2): 325 mg via ORAL
  Filled 2014-03-24 (×2): qty 1

## 2014-03-24 MED ORDER — ASPIRIN 300 MG RE SUPP
300.0000 mg | Freq: Every day | RECTAL | Status: DC
  Filled 2014-03-24 (×5): qty 1

## 2014-03-24 MED ORDER — STROKE: EARLY STAGES OF RECOVERY BOOK
Freq: Once | Status: DC
  Filled 2014-03-24: qty 1

## 2014-03-24 MED ORDER — SIMVASTATIN 10 MG PO TABS
5.0000 mg | ORAL_TABLET | Freq: Every day | ORAL | Status: DC
Start: 2014-03-24 — End: 2014-03-25
  Filled 2014-03-24: qty 1

## 2014-03-24 MED ORDER — POTASSIUM CHLORIDE CRYS ER 20 MEQ PO TBCR
40.0000 meq | EXTENDED_RELEASE_TABLET | Freq: Once | ORAL | Status: AC
  Administered 2014-03-24: 40 meq via ORAL
  Filled 2014-03-24: qty 2

## 2014-03-24 MED ORDER — SENNOSIDES-DOCUSATE SODIUM 8.6-50 MG PO TABS: 1.0000 | ORAL_TABLET | Freq: Every evening | ORAL | Status: DC | PRN

## 2014-03-24 MED ORDER — ACETAMINOPHEN 650 MG RE SUPP: 650.0000 mg | RECTAL | Status: DC | PRN

## 2014-03-24 MED ORDER — ENOXAPARIN SODIUM 40 MG/0.4ML ~~LOC~~ SOLN: 40.0000 mg | SUBCUTANEOUS | Status: DC

## 2014-03-24 NOTE — H&P (Signed)
Patient seen, independently examined and chart reviewed. I agree with exam, assessment and plan discussed with Toya Smothers, NP.  Very pleasant 53 year old woman with history of hypertension and diabetes who was seen in the emergency Department 7/31 for left-sided numbness. She start on aspirin for suspected TIA and discharged home. She did not comply with aspirin. Day before admission at work she had again some recurrent tingling of her head and left side of the body. Some difficulty walking at that time. She has no neurologic deficits today. She did see her primary care physician today who recommended MRI based on history which revealed stroke.  Currently she feels well and has no complaints. No neurologic symptoms.  PMH Hypertension Diabetes mellitus  Objective: afebrile, VSS, no hypoxia  Gen. Appears calm and comfortable.  Psych. Alert. Speech fluent and clear.  Eyes. Pupils equal, round, react to light. Normal lids, irises.  ENT. Lips and tongue appear unremarkable.  Neck. Appears grossly unremarkable  Cardiovascular. Regular rate and rhythm. 2/6 holosystolic murmur. No murmur gallop. No lower extremity edema.  Respiratory. Clear to auscultation bilaterally. No wheezes, rales or rhonchi. Normal respiratory effort.  Skin. Appears grossly unremarkable  Musculoskeletal. Tone and strength all extremities grossly normal. Strength 5/5.  Neurologic. Cranial nerves 2-12 appear grossly intact. Face is symmetric. No pronator drift. No upper extremity dysdiadochokinesis.   Chemistry: Potassium 2.9. Complete metabolic panel there is unremarkable.  Heme: CBC unremarkable.  Other: EKG sinus rhythm, no acute changes  Imaging:  Acute/subacute right PCA infarct. No mass effect. Right PCA occlusion.  Subacute stroke. Plan admission for further evaluation of stroke including MRI of the brain, 2-D echocardiogram and carotid ultrasound. Therapy evaluations. Currently no focal neurologic  deficits. Likely discharge date/7 after workup complete.  Brendia Sacks, MD [facility] [phone]

## 2014-03-24 NOTE — ED Notes (Signed)
i stat troponin changed to lab troponin

## 2014-03-24 NOTE — ED Notes (Signed)
Pt. Reports "tingling" on left side of body since July 27th, seen her on July 31, reports being told she had "mini" stroke and was discharged, reports still feeling "tingling" went to see PCP this morning,  MRI was ordered, and pt. Was brought here from after MRI due to new/acute findings

## 2014-03-24 NOTE — ED Provider Notes (Addendum)
This chart was scribed for Crystal Snow Josephmichael Lisenbee, DO by Jarvis Morgan, ED Scribe. This patient was seen in room APA02/APA02    TIME SEEN: 10:52 AM   CHIEF COMPLAINT:   Chief Complaint  Patient presents with  . Cerebrovascular Accident    HPI:  Crystal Snow is a 53 y.o. female with a h/o HTN, hyperlipidemia, and DM w/o complication who presents to the Emergency Department due to a CVA. Patient states she has experienced "tingling" on left side of body since July 27th. She presented to the ED on July 31st and was told she had an old stroke seen on head CT and was discharged home on aspirin. Patient states she went to see PCP, Dr. Hassan Rowan, this morning and MRI was ordered. Pt was brought here after MRI due to acute findings of right PCA infarct with occlusion of the PCA. Patient reports she is still feeling intermittent, moderate, left sided numbness from her "face to her feet" but none currently. She states she'll also have blurry vision with these episodes. She states that the numbness comes in episodes and lasts for 5-10 minutes at a time. She states she also has had some diarrhea but states it has been occurring for a while and feels its unrelated to her current symptoms. She states she has not been taking aspirin as instructed. She is a non smoker. She denies and chest pain, HA, SOB, fever,chills, dizziness, lightheadedness, focal weakness, blurred vision or slurred speech.   ROS: See HPI Constitutional: no fever or chills. Eyes: no drainage, no blurred vision ENT: no runny nose   Cardiovascular:  no chest pain Resp: no SOB  GI: no vomiting. Positive for diarrhea.  GU: no dysuria Integumentary: no rash  Allergy: no hives  Musculoskeletal: no leg swelling  Neurological: no slurred speech, headache, dizziness, lightheadedness, or weakness. Positive for left sided numbness. ROS otherwise negative  PAST MEDICAL HISTORY/PAST SURGICAL HISTORY:  Past Medical History  Diagnosis Date   . Hypertension   . Diabetes mellitus without complication     MEDICATIONS:  Prior to Admission medications   Medication Sig Start Date End Date Taking? Authorizing Provider  lisinopril-hydrochlorothiazide (PRINZIDE,ZESTORETIC) 20-12.5 MG per tablet Take 1 tablet by mouth daily. 03/04/14   Historical Provider, MD  lovastatin (MEVACOR) 20 MG tablet Take 2 tablets by mouth daily. 03/04/14   Historical Provider, MD  metFORMIN (GLUCOPHAGE) 1000 MG tablet Take 1,000 mg by mouth 2 (two) times daily with a meal.    Historical Provider, MD  potassium chloride SA (K-DUR,KLOR-CON) 20 MEQ tablet Take 1 tablet (20 mEq total) by mouth daily. 03/18/14   Benny Lennert, MD    ALLERGIES:  No Known Allergies  SOCIAL HISTORY:  History  Substance Use Topics  . Smoking status: Never Smoker   . Smokeless tobacco: Not on file  . Alcohol Use: No    FAMILY HISTORY: History reviewed. No pertinent family history.  EXAM: BP 144/97  Pulse 85  Temp(Src) 97.8 F (36.6 C) (Oral)  Resp 22  Ht 5\' 2"  (1.575 m)  Wt 205 lb (92.987 kg)  BMI 37.49 kg/m2  SpO2 99% CONSTITUTIONAL: Alert and oriented and responds appropriately to questions. Well-appearing; well-nourished; GCS 15 HEAD: Normocephalic; atraumatic EYES: Conjunctivae clear, PERRL, EOMI ENT: normal nose; no rhinorrhea; moist mucous membranes; pharynx without lesions noted; no dental injury; no hemotypanum; no septal hematoma NECK: Supple, no meningismus, no LAD; no midline spinal tenderness, step-off or deformity CARD: RRR; S1 and S2 appreciated; no murmurs, no  clicks, no rubs, no gallops RESP: Normal chest excursion without splinting or tachypnea; breath sounds clear and equal bilaterally; no wheezes, no rhonchi, no rales; chest wall stable, nontender to palpation ABD/GI: Normal bowel sounds; non-distended; soft, non-tender, no rebound, no guarding PELVIS:  stable, nontender to palpation BACK:  The back appears normal and is non-tender to palpation,  there is no CVA tenderness; no midline spinal tenderness, step-off or deformity EXT: Normal ROM in all joints; non-tender to palpation; no edema; normal capillary refill; no cyanosis    SKIN: Normal color for age and race; warm NEURO: Moves all extremities equally, sensation to light touch intact diffusely, cranial nerves II through XII intact, strength 5/5 in all 4 extremity, normal visual fields, no dysmetria to finger to nose testing PSYCH: The patient's mood and manner are appropriate. Grooming and personal hygiene are appropriate.  MEDICAL DECISION MAKING: Patient here with acute/subacute stroke seen on MRI today. Discussed with Dr. Amada JupiterKirkpatrick given the occlusion seen of her right PCA. He states that there be no intervention done at this time he recommended admission to medicine for stroke workup. He does not feel [facility] and [facility].  We'll obtain labs, urine and EKG.  ED PROGRESS: Patient's labs are unremarkable. EKG shows no arrhythmia. She is still hemodynamically stable and neurologically intact. Discussed with Dr. Irene LimboGoodrich with hospitalist service for admission to inpt, tele bed.  I personally performed the services described in this documentation, which was scribed in my presence. The recorded information has been reviewed and is accurate.     Crystal MawKristen N Alieyah Spader, DO 03/24/14 1409    EKG Interpretation  Date/Time:  Thursday March 24 2014 11:31:35 EDT Ventricular Rate:  81 PR Interval:  203 QRS Duration: 104 QT Interval:  375 QTC Calculation: 435 R Axis:   50 Text Interpretation:  Sinus rhythm Borderline prolonged PR interval Borderline T abnormalities, diffuse leads Confirmed by Joscelynn Brutus,  DO, Aliveah Gallant [phone]) on 03/24/2014 2:18:56 PM        Crystal MawKristen N Charell Faulk, DO 03/24/14 1419

## 2014-03-24 NOTE — ED Notes (Signed)
Pt. C/o burning at right wrist due to potassium drip, potassium slowed down.

## 2014-03-24 NOTE — H&P (Signed)
[facility] History and Physical  Crystal Snow WJX:[id number] DOB: 11/29/1960 DOA: 03/24/2014  Referring physician:  PCP: Lake Bells, PA-C   Chief Complaint: left sided tingling  HPI: Crystal Snow is a very pleasant 53 y.o. female with a past medical history of diabetes, hypertension presents to the emergency department with the chief complaint of left sided tingling. Initial workup in the emergency department reveals acute/subacute stroke as well as hypokalemia.  Patient reports that 6 days ago she was seen in the emergency department do to "tingling" on the left side of her body. Work up included CT of the head without contrast revealing changes consistent with a right occipital infarct and no acute changes. Her symptoms had subsided she was instructed to take aspirin daily and discharged. He states he was doing fine until yesterday while at work she developed recurrent tingling that started on the left side of her head and face and spread down her entire left side of her body. Associated symptoms include mild headache blurred vision and unsteady gait. She denies any weakness in her extremities difficulty swallowing difficulty speaking. She reports the episode lasted about 10 minutes and then completely resolved. She decided to go to her primary care provider today and share her experience. She was advised to come to the emergency department for evaluation. Of note she reports that she has not been taking the aspirin that was recommended earlier in the week.   In the emergency department she has an MRI revealing acute/subacute right PCA infarct. Mild petechial hemorrhage and  edema. No significant mass effect. Evidence of right PCA occlusion, congruent with the above. Complete metabolic panel significant for a potassium level of 2.9 otherwise unremarkable. Complete blood count is unremarkable, initial troponin is negative, EKG shows sinus rhythm with a borderline PR  interval. Her vital signs are stable she is afebrile and not hypoxic.  Review of Systems:  10 point review of systems completed and all systems are negative except as indicated in the history of present illness   Past Medical History  Diagnosis Date  . Hypertension   . Diabetes mellitus without complication   . CVA (cerebral infarction)     03/2014  . Cholecystitis     lap chole 2006   Past Surgical History  Procedure Laterality Date  . Abdominal hysterectomy     Social History:  reports that she has never smoked. She does not have any smokeless tobacco history on file. She reports that she does not drink alcohol or use illicit drugs. Lives alone she does not smoke she does not drink denies drug use. 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 He is independent with ADLs No Known Allergies  History reviewed. No pertinent family history. her mother is alive medical history includes diabetes hypertension stroke, father deceased of unknown causes patient has 9 siblings their collective medical history is positive for diabetes, strokes, brain tumor, cancer specifically unknown  Prior to Admission medications   Medication Sig Start Date End Date Taking? Authorizing Provider  lisinopril-hydrochlorothiazide (PRINZIDE,ZESTORETIC) 20-12.5 MG per tablet Take 1 tablet by mouth daily. 03/04/14  Yes Historical Provider, MD  lovastatin (MEVACOR) 20 MG tablet Take 2 tablets by mouth daily. 03/04/14  Yes Historical Provider, MD  metFORMIN (GLUCOPHAGE) 1000 MG tablet Take 1,000 mg by mouth 2 (two) times daily with a meal.   Yes Historical Provider, MD   Physical Exam: Filed Vitals:   03/24/14 1045 03/24/14 1349  BP: 133/97 144/97  Pulse: 84 85  Temp: 97.8 F (36.6 C)   TempSrc: Oral   Resp:  22  Height: 5\' 2"  (1.575 m)   Weight: 92.987 kg (205 lb)   SpO2: 99% 99%    Wt Readings from Last 3 Encounters:  03/24/14 92.987 kg (205 lb)  03/18/14 95.255 kg (210 lb)    General:   Appears calm and comfortable Eyes: PERRL, normal lids, irises & conjunctiva ENT: grossly normal hearing, lips & tongue Neck: no LAD, masses or thyromegaly Cardiovascular: RRR, no m/r/g. Trace LE edema Telemetry: SR, no arrhythmias  Respiratory: CTA bilaterally, no w/r/r. Normal respiratory effort. Abdomen: soft, obese, non-distended non tender to palpation. +BS Skin: no rash or induration seen on limited exam Musculoskeletal: grossly normal tone BUE/BLE Psychiatric: grossly normal mood and affect, speech fluent and appropriate Neurologic: Speech clear facial symmetry cranial nerves II through XII grossly intact bilateral grip 5 over 5 bilateral lower extremity strength 5 over 5 sensation intact bilaterally           Labs on Admission:  Basic Metabolic Panel:  Recent Labs Lab 03/18/14 1344 03/24/14 1118  NA 143 140  K 2.9* 2.9*  CL 101 99  CO2 27 29  GLUCOSE 141* 170*  BUN 10 12  CREATININE 0.73 0.75  CALCIUM 9.0 9.3   Liver Function Tests:  Recent Labs Lab 03/18/14 1344 03/24/14 1118  AST 17 20  ALT 16 17  ALKPHOS 66 62  BILITOT 0.2* 0.2*  PROT 7.5 7.5  ALBUMIN 3.6 3.5   No results found for this basename: LIPASE, AMYLASE,  in the last 168 hours No results found for this basename: AMMONIA,  in the last 168 hours CBC:  Recent Labs Lab 03/18/14 1344 03/24/14 1118  WBC 7.2 6.9  NEUTROABS 3.2 2.8  HGB 12.6 12.7  HCT 37.9 38.1  MCV 80.6 80.0  PLT 292 319   Cardiac Enzymes: No results found for this basename: CKTOTAL, CKMB, CKMBINDEX, TROPONINI,  in the last 168 hours  BNP (last 3 results) No results found for this basename: PROBNP,  in the last 8760 hours CBG: No results found for this basename: GLUCAP,  in the last 168 hours  Radiological Exams on Admission: Mr Brain Wo Contrast  03/24/2014   CLINICAL DATA:  53 year old female with left side weakness and numbness since 03/14/2014. No known injury. Initial encounter. History of hypertension, diabetes,  hyperlipidemia.  EXAM: MRI HEAD WITHOUT CONTRAST  TECHNIQUE: Multiplanar, multiecho pulse sequences of the brain and surrounding structures were obtained without intravenous contrast.  COMPARISON:  Head CT without contrast 03/18/2014.  FINDINGS: Confluent restricted diffusion in the right occipital pole, corresponding to the hypodense area on the recent CT. Patchy cortical and white matter involvement (series 100, image 12). There is also a tiny medial cortical focus of restricted diffusion in the inferior right parietal lobe (series 100, image 20).  No contralateral or posterior fossa restricted diffusion.  The above diffusion changes are associated with heterogeneous T2 and FLAIR hyperintensity, mild regional mass effect, and also intrinsic T1 signal and subtle T2 * heterogeneity (series 8, image 46).  Major intracranial vascular flow voids are preserved, except for the right PCA.  No ventriculomegaly. No other intracranial hemorrhage. No midline shift. Basilar cisterns are patent. Negative pituitary, cervicomedullary junction and visualized cervical spine. Scattered supratentorial nonspecific T2 and FLAIR hyperintensity (series 7, image 19), moderate for age. No cortical encephalomalacia identified.  Visualized orbit soft tissues are within normal limits. Visualized paranasal sinuses and mastoids are  clear. Visible internal auditory structures appear normal. Visualized scalp soft tissues are within normal limits. Visualized bone marrow signal is within normal limits.  IMPRESSION: 1. Acute/subacute right PCA infarct. Mild petechial hemorrhage and edema. No significant mass effect. 2. Evidence of right PCA occlusion, congruent with the above. 3. Moderate for age nonspecific cerebral white matter signal changes, may reflect chronic small vessel disease in this setting. Study discussed by telephone with Dr. Theodis Shove on 03/24/2014 at 10:28 .   Electronically Signed   By: Augusto Gamble M.D.   On: 03/24/2014 10:31     EKG: Independently reviewed. EKG with sinus rhythm borderline prolonged PR interval  Assessment/Plan Principal Problem:   CVA (cerebral infarction); per MRI as above. Risk factors include hypertension diabetes family history. Will admit to telemetry for observation. Will obtain MRA of the brain, 2-D echo, carotid Dopplers. Will check hemoglobin A1c and a lipid panel. Will provide aspirin and statin. We'll request PT OT evaluation. She passed bedside swallow eval in the emergency will provide heart healthy car modified diet. Will request neurology consult  Active Problems:  Hypokalemia: Likely related to HCTZ that she takes at home. She received 10 mEq intravenously in the emergency department. I will give her one dose of K-Dur by mouth. Will hold her HCTZ.. will recheck in the more  HYPERTENSION; controlled in the emergency department. I medications include lisinopril, HCTZ. I will hold these for now to allow for permissive hypertension. Will monitor close. Resume when indicated    DIABETES MELLITUS, CONTROLLED: Patient does not check her blood sugar or follow her car modified diet. Serum glucose 170 on admission. Home medications include metformin. I will hold this for now. Will use sliding scale insulin for optimal glycemic control. Will request diabetes coordinator for educational purposes. Will check hemoglobin A1c. She will need close followup with her primary care    HYPERLIPIDEMIA: Home medications include lovastatin. I will continue this. Obtain a lipid panel    OBESITY: Nutritional consult     Neurology Code Status: full DVT Prophylaxis: Family Communication: none present Disposition Plan: home hopefully tomorrow  Time spent: 65 minutes  [facility]   **Disclaimer: This note may have been dictated with voice recognition software. Similar sounding words can inadvertently be transcribed and this note may contain transcription errors which may not have  been corrected upon publication of note.**

## 2014-03-24 NOTE — ED Notes (Signed)
Pt sent from MRI for CVA. Symptoms started Monday per pt. Pt ambulated without difficulty from radiology.

## 2014-03-24 NOTE — ED Notes (Signed)
CRITICAL VALUE ALERT  Critical value received:  Potassium 2.9 Date of notification:  03/24/2014  Time of notification: 11:50  Critical value read back:Yes.    Nurse who received alert: Clint Bolder  MD notified (1st page):  Hope Neese  Time of first page:  11:50  MD notified (2nd page):  Time of second page:  Responding MD:  Kerrie Buffalo  Time MD responded:

## 2014-03-25 DIAGNOSIS — I359 Nonrheumatic aortic valve disorder, unspecified: Secondary | ICD-10-CM

## 2014-03-25 LAB — HEMOGLOBIN A1C
Hgb A1c MFr Bld: 7.4 % — ABNORMAL HIGH (ref <5.7)
Mean Plasma Glucose: 166 mg/dL — ABNORMAL HIGH (ref <117)

## 2014-03-25 LAB — BASIC METABOLIC PANEL
Anion gap: 11 (ref 5–15)
BUN: 12 mg/dL (ref 6–23)
CALCIUM: 8.8 mg/dL (ref 8.4–10.5)
CHLORIDE: 101 meq/L (ref 96–112)
CO2: 29 meq/L (ref 19–32)
Creatinine, Ser: 0.84 mg/dL (ref 0.50–1.10)
GFR calc Af Amer: 89 mL/min — ABNORMAL LOW (ref >90)
GFR calc non Af Amer: 77 mL/min — ABNORMAL LOW (ref >90)
Glucose, Bld: 112 mg/dL — ABNORMAL HIGH (ref 70–99)
Potassium: 3.4 mEq/L — ABNORMAL LOW (ref 3.7–5.3)
SODIUM: 141 meq/L (ref 137–147)

## 2014-03-25 LAB — GLUCOSE, CAPILLARY
GLUCOSE-CAPILLARY: 108 mg/dL — AB (ref 70–99)
Glucose-Capillary: 166 mg/dL — ABNORMAL HIGH (ref 70–99)
Glucose-Capillary: 89 mg/dL (ref 70–99)

## 2014-03-25 LAB — TROPONIN I: Troponin I: <0.3 ng/mL (ref <0.30)

## 2014-03-25 MED ORDER — POTASSIUM CHLORIDE ER 10 MEQ PO TBCR
10.0000 meq | EXTENDED_RELEASE_TABLET | Freq: Every day | ORAL | Status: DC
Start: 2014-03-25 — End: 2017-01-23

## 2014-03-25 MED ORDER — CARVEDILOL 3.125 MG PO TABS: 3.1250 mg | ORAL_TABLET | Freq: Two times a day (BID) | ORAL | Status: DC

## 2014-03-25 MED ORDER — LIVING WELL WITH DIABETES BOOK
Freq: Once | Status: AC
  Administered 2014-03-25: 12:00:00
  Filled 2014-03-25: qty 1

## 2014-03-25 MED ORDER — POTASSIUM CHLORIDE CRYS ER 20 MEQ PO TBCR
40.0000 meq | EXTENDED_RELEASE_TABLET | Freq: Once | ORAL | Status: AC
  Administered 2014-03-25: 40 meq via ORAL
  Filled 2014-03-25: qty 2

## 2014-03-25 MED ORDER — ASPIRIN 325 MG PO TABS: 325.0000 mg | ORAL_TABLET | Freq: Every day | ORAL | Status: DC

## 2014-03-25 NOTE — Evaluation (Signed)
Physical Therapy Evaluation Patient Details Name: Crystal Snow MRN: [id number] DOB: 1960-11-27 Today's Date: 03/25/2014   History of Present Illness  Crystal Skeetersnnette M Bellomo is a very pleasant 53 y.o. female with a past medical history of diabetes, hypertension presents to the emergency department with the chief complaint of left sided tingling. Initial workup in the emergency department reveals acute/subacute stroke as well as hypokalemia  Clinical Impression  Patient displays independence with all bed mobility, transfers, gait, and stairs, and displays no strength discrepancy between Lt and Rt LEs. Patient to be discharged home, with no skilled physical therapy recommended as patient displays no physical therapy needs and states "I don't want to do any therapy."    Follow Up Recommendations No PT follow up    Equipment Recommendations  None recommended by PT       Precautions / Restrictions Precautions Precautions: None      Mobility  Bed Mobility Overal bed mobility: Independent                Transfers Overall transfer level: Independent Equipment used: None                Ambulation/Gait Ambulation/Gait assistance: Independent Ambulation Distance (Feet): 200 Feet Assistive device: None Gait Pattern/deviations: WFL(Within Functional Limits)   Gait velocity interpretation: at or above normal speed for age/gender    Stairs Stairs: Yes Stairs assistance: Independent Stair Management: One rail Right;Step to pattern Number of Stairs: 14 General stair comments: patient only has 2 stairs to get into home stating "I don't like stairs but i can do them just fine."     Balance Overall balance assessment: Independent                                           Pertinent Vitals/Pain Pain Assessment: No/denies pain    Home Living Family/patient expects to be discharged to:: Private residence Living Arrangements: Alone   Type of  Home: House Home Access: Stairs to enter Entrance Stairs-Rails: Can reach both;Right;Left Entrance Stairs-Number of Steps: 2 steps Home Layout: One level Home Equipment: None      Prior Function Level of Independence: Independent         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      Hand Dominance   Dominant Hand: Right    Extremity/Trunk Assessment   Upper Extremity Assessment: Defer to OT evaluation           Lower Extremity Assessment: Overall WFL for tasks assessed      Cervical / Trunk Assessment: Normal  Communication   Communication: No difficulties  Cognition Arousal/Alertness: Awake/alert Behavior During Therapy: WFL for tasks assessed/performed Overall Cognitive Status: Within Functional Limits for tasks assessed                               Assessment/Plan    PT Assessment Patent does not need any further PT services                      Co-evaluation   Reason for Co-Treatment: For patient/therapist safety PT goals addressed during session: Mobility/safety with mobility;Balance;Strengthening/ROM;Proper use of DME OT goals addressed during session: ADL's and self-care;Proper use of Adaptive equipment and DME;Strengthening/ROM       End of Session   Activity Tolerance: Patient tolerated treatment well Patient  left: in chair;with call bell/phone within reach      Functional Assessment Tool Used: Clinical judgement Functional Limitation: Mobility: Walking and moving around Mobility: Walking and Moving Around Current Status [phone]): 0 percent impaired, limited or restricted Mobility: Walking and Moving Around Goal Status [phone]): 0 percent impaired, limited or restricted Mobility: Walking and Moving Around Discharge Status [phone]): 0 percent impaired, limited or restricted    Time: 0822-0834 PT Time Calculation (min): 12 min   Charges:   PT Evaluation $Initial PT Evaluation Tier I: 1 Procedure PT Treatments $Gait  Training: 8-22 mins   PT G Codes:   Functional Assessment Tool Used: Clinical judgement Functional Limitation: Mobility: Walking and moving around         0% impaired  Alton Bouknight R 03/25/2014, 9:01 AM

## 2014-03-25 NOTE — Evaluation (Signed)
Occupational Therapy Evaluation Patient Details Name: Crystal Snow MRN: [id number] DOB: 1961-03-06 Today's Date: 03/25/2014    History of Present Illness   Crystal Snow is a very pleasant 53 y.o. female with a past medical history of diabetes, hypertension presents to the emergency department with the chief complaint of left sided tingling. Initial workup in the emergency department reveals acute/subacute stroke as well as hypokalemia.    Clinical Impression   OT eval completed. Patient presents with full AROM of UE. 5/5 strength in both extremities. Blurry vision has resolved. No deficits noted. Recommend patient return home. No follow up OT services required.              Precautions / Restrictions Precautions Precautions: None      Mobility Bed Mobility Overal bed mobility: Independent                Transfers Overall transfer level: Independent Equipment used: None                       ADL Overall ADL's : Independent                                                   Praxis Praxis Praxis tested?: Within functional limits    Pertinent Vitals/Pain Pain Assessment: No/denies pain     Hand Dominance Right   Extremity/Trunk Assessment Upper Extremity Assessment Upper Extremity Assessment: Overall WFL for tasks assessed   Lower Extremity Assessment Lower Extremity Assessment: Defer to PT evaluation   Cervical / Trunk Assessment Cervical / Trunk Assessment: Normal   Communication Communication Communication: No difficulties   Cognition Arousal/Alertness: Awake/alert Behavior During Therapy: WFL for tasks assessed/performed Overall Cognitive Status: Within Functional Limits for tasks assessed                                Home Living Family/patient expects to be discharged to:: Private residence Living Arrangements: Alone   Type of Home: House Home Access: Stairs to enter [employer] of Steps: 2 steps Entrance Stairs-Rails: Can reach both;Right;Left Home Layout: One level     Bathroom Shower/Tub: Tub/shower unit;Curtain Shower/tub characteristics: Engineer, building services: Standard     Home Equipment: None          Prior Functioning/Environment Level of Independence: Independent        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                    Barriers to D/C:  None          Co-evaluation PT/OT/SLP Co-Evaluation/Treatment: Yes Reason for Co-Treatment: For patient/therapist safety PT goals addressed during session: Mobility/safety with mobility;Balance;Strengthening/ROM OT goals addressed during session: ADL's and self-care; Strengthening/ROM      End of Session Equipment Utilized During Treatment: Gait belt  Activity Tolerance: Patient tolerated treatment well Patient left: in chair;with call bell/phone within reach   Time: 0822-0833 OT Time Calculation (min): 11 min Charges:  OT General Charges $OT Visit: 1 Procedure OT Evaluation $Initial OT Evaluation Tier I: 1 Procedure G-Codes: OT G-codes **NOT FOR INPATIENT CLASS** Functional Assessment Tool Used: observation Functional Limitation: Self care Self Care Current Status (J8563): 0 percent impaired, limited or restricted Self Care Goal Status (J4970): 0 percent impaired, limited  or restricted Self Care Discharge Status (G8989): 0 [phone] impaired, limited or restricted  Limmie PatriciaLaura Brighten Orndoff, OTR/L,CBIS   03/25/2014, 8:49 AM

## 2014-03-25 NOTE — Plan of Care (Signed)
Problem: Food- and Nutrition-Related Knowledge Deficit (NB-1.1) Goal: Nutrition education Formal process to instruct or train a patient/client in a skill or to impart knowledge to help patients/clients voluntarily manage or modify food choices and eating behavior to maintain or improve health. Outcome: Adequate for Discharge  RD consulted for nutrition education regarding diabetes. Pt home diet is regular and says she has not been trying to follow a carbohydrate modified diet at all.     Lab Results  Component Value Date    HGBA1C 7.4* 03/24/2014    RD provided "Carbohydrate Counting" handout and booklet Nutrition in [employer]. Discussed different food groups and their effects on blood sugar, emphasizing carbohydrate-containing foods. Provided list of carbohydrates and recommended serving sizes of common foods.  Discussed importance of controlled and consistent carbohydrate intake throughout the day. Provided examples of ways to balance meals/snacks and encouraged intake of high-fiber, whole grain complex carbohydrates. Teach back method used.  Expect good compliance.  Body mass index is 43.48 kg/(m^2). Pt meets criteria for class 3 based on current BMI.  Current diet order is CHO modified, patient is consuming approximately 75-100% of meals at this time. Labs and medications reviewed. No further nutrition interventions warranted at this time.   Royann Shivers MS,RD,CSG,LDN Office: [phone] Pager: [phone]

## 2014-03-25 NOTE — Care Management Note (Signed)
    Page 1 of 1   03/25/2014     1:05:49 PM CARE MANAGEMENT NOTE 03/25/2014  Patient:  Crystal Snow, Crystal Snow   Account Number:  [id number]  Date Initiated:  03/25/2014  Documentation initiated by:  Kathyrn Sheriff  Subjective/Objective Assessment:   Patient from home with self care, inpendent with ADL's.     Action/Plan:   Patient plans to return home with self care at discharge. Patient plans to discharge home today. No CM needs noted at this time.   Anticipated DC Date:  03/25/2014   Anticipated DC Plan:  HOME/SELF CARE      DC Planning Services  CM consult      Choice offered to / List presented to:             Status of service:  Completed, signed off Medicare Important Message given?   (If response is "NO", the following Medicare IM given date fields will be blank) Date Medicare IM given:   Medicare IM given by:   Date Additional Medicare IM given:   Additional Medicare IM given by:    Discharge Disposition:  HOME/SELF CARE  Per UR Regulation:    If discussed at Long Length of Stay Meetings, dates discussed:    Comments:  03/25/2014 1300 Kathyrn Sheriff, RN, MSN, [facility]

## 2014-03-25 NOTE — Progress Notes (Signed)
Spoke with patient about diabetes and home regimen for diabetes control. Patient reports that she has had diabetes "for a long time" estimated to be over 5 years and is followed by her PCP for diabetes management. She states that she takes Metformin 1000 mg BID as an outpatient for diabetes control.  Inquired about knowledge about A1C and patient reports that she does not know what an A1C is. Discussed A1C results (7.4% on 03/24/14) and explained what an A1C is, basic pathophysiology of DM Type 2, basic home care, importance of checking CBGs and maintaining good CBG control to prevent long-term and short-term complications. Discussed impact of nutrition, exercise, stress, sickness, and medications on diabetes control.  Patient states that she has a glucometer at home that was recently given to her by her PCP office. However, she does not have any test strips for the glucometer but she does have a prescription for the test strips she just needs to get the prescription filled. Reviewed technique for testing glucose and patient states that she feels comfortable with monitoring glucose. Patient states that she has already met with RD for diet education and she reports that she feels that she can make diet changes and she now understands carbohydrate modified diet better. Informed patient about free outpatient diabetes education class and encouraged patient to attend one of the classes to reinforce diet education. Patient verbalized understanding of information discussed and she states that she has no further questions at this time related to diabetes. Ordered Living Well with Diabetes booklet and diabetes education videos. Talked with Val, LPN and informed about patient conversation and requested patient watch videos and review booklet.  Thanks, Barnie Alderman, RN, MSN, CCRN Diabetes Coordinator Inpatient Diabetes Program [phone] (Team Pager) [phone] (AP office) [phone] [facility] office)

## 2014-03-25 NOTE — Progress Notes (Signed)
*  PRELIMINARY RESULTS* Echocardiogram 2D Echocardiogram has been performed.  Crystal Snow 03/25/2014, 12:18 PM

## 2014-03-25 NOTE — Discharge Summary (Signed)
Physician Discharge Summary  Crystal Snow ZOX:[id number] DOB: [birth year] DOA: 03/24/2014  PCP: Lake Bells, PA-C  Admit date: 03/24/2014 Discharge date: 03/25/2014  Time spent: 40 minutes  Recommendations for Outpatient Follow-up:  1. Follow up with Dr Gerilyn Pilgrim in 2 months for evaluation of symptoms and treatment plan for managing risk factors 2. Dr Sharene Skeans office to contact patient to schedule OP follow up to echo results. Of note coreg not started at discharge as BP low end of normal.  3. Follow up with PCP 1-2 weeks for evaluation DM control. Also recommend bmet to track potassium level.   Discharge Diagnoses:  Principal Problem:   CVA (cerebral infarction) Active Problems:   DIABETES MELLITUS, CONTROLLED   HYPERLIPIDEMIA   OBESITY   HYPERTENSION   Hypokalemia   Obesity   Discharge Condition: stable  Diet recommendation: carb modified  Filed Weights   03/24/14 1045 03/24/14 1538  Weight: 92.987 kg (205 lb) 107.865 kg (237 lb 12.8 oz)    History of present illness:  Crystal Snow is a very pleasant 53 y.o. female with a past medical history of diabetes, hypertension presented to the emergency department with the chief complaint of left sided tingling. Initial workup in the emergency department reveals acute/subacute stroke as well as hypokalemia.   Patient reported that 6 days prior she was seen in the emergency department due to "tingling" on the left side of her body. Work up included CT of the head without contrast revealing changes consistent with a right occipital infarct and no acute changes. Her symptoms had subsided she was instructed to take aspirin daily and discharged. He stated he was doing fine until day prior to presentation while at work she developed recurrent tingling that started on the left side of her head and face and spread down her entire left side of her body. Associated symptoms included mild headache blurred vision and unsteady gait.  She denied any weakness in her extremities difficulty swallowing difficulty speaking. She reported the episode lasted about 10 minutes and then completely resolved. She decided to go to her primary care provider  and shared her experience. She was advised to come to the emergency department for evaluation. Of note she reported that she had not been taking the aspirin that was recommended earlier in the week.   In the emergency department she had an MRI revealing acute/subacute right PCA infarct. Mild petechial hemorrhage and edema. No significant mass effect. Evidence of right PCA occlusion, congruent with the above. Complete metabolic panel significant for a potassium level of 2.9 otherwise unremarkable. Complete blood count unremarkable, initial troponin was negative, EKG showed sinus rhythm with a borderline PR interval. Her vital signs were stable she was afebrile and not hypoxic.  Hospital Course:  CVA (cerebral infarction); per MRI as below. MRA of the brain with slow flow versus versus occluded proximal right P2 segment with minimal enhancement of the distal P2 and P3 segments suggesting collateralization. 2-D echo with EF 40-45% with global kinesis and grade 1 diastolic dysfunctin , carotid Doppler with  minor carotid atherosclerosis. No hemodynamically significant ICA stenosis detected by ultrasound. A1c 7.4 and a lipid panel with triglycerides 189 and LDL 100. She had no recurrence episodes. Symptoms totally resolved.  Will discharge on full dose aspirin and resume home statin. Case discussed with cards who recommended low dose coreg. At discharge BP low end of normal so coreg not started at discharge. Dr Sharene Skeans office to contact patient of follow up to echo results and  evaluate need for coreg.  Active Problems:  Hypokalemia: Likely related to HCTZ that she takes at home. Resolved at discharge. Will discharge with low dose supplement. Recommend BMET in 1-2 weeks to track.  HYPERTENSION;  controlled. Continue home med   DIABETES MELLITUS, CONTROLLED: Patient does not check her blood sugar or follow her car modified diet. Serum glucose 170 on admission. Home medications include metformin. Counseled by diabetes coordinator regarding A1c, diet, exercise. Instructed to journal CBG reading and to check daily for evaluation by PCP   HYPERLIPIDEMIA: continue statin. Lipid panel as above  OBESITY: Nutritional consult BMI 43   Procedures: echoSystolic function was mildly to moderately reduced. The estimated ejection fraction was in the range of 40% to 45%. Global hypokinesis is seen. Doppler parameters are consistent with abnormal left ventricular relaxation (grade 1 diastolic dysfunction). - Aortic valve: Mildly calcified annulus. Mildly thickened leaflets. There was mild regurgitation.  Consultations:  none  Discharge Exam: Filed Vitals:   03/25/14 1400  BP: 131/81  Pulse: 79  Temp: 98.3 F (36.8 C)  Resp: 18    General: obese NAD Cardiovascular: RRR No MGR No LE edema Respiratory: normal effort BS clear bilaterally Neuro: cranial nerve II-XII intact. Speech clear facial symetry  Discharge Instructions You were cared for by a hospitalist during your hospital stay. If you have any questions about your discharge medications or the care you received while you were in the hospital after you are discharged, you can call the unit and asked to speak with the hospitalist on call if the hospitalist that took care of you is not available. Once you are discharged, your primary care physician will handle any further medical issues. Please note that NO REFILLS for any discharge medications will be authorized once you are discharged, as it is imperative that you return to your primary care physician (or establish a relationship with a primary care physician if you do not have one) for your aftercare needs so that they can reassess your need for medications and monitor your lab  values.      Discharge Instructions   Diet - low sodium heart healthy    Complete by:  As directed      Discharge instructions    Complete by:  As directed   Take medication as directed Follow carb modified diet Follow up with cardiology as scheduled Follow up with PCP for tracking of A1c. Check CBG twice daily and document Take CBG documentation to PCP for evaluation     Increase activity slowly    Complete by:  As directed             Medication List         aspirin 325 MG tablet  Take 1 tablet (325 mg total) by mouth daily.     lisinopril-hydrochlorothiazide 20-12.5 MG per tablet  Commonly known as:  PRINZIDE,ZESTORETIC  Take 1 tablet by mouth daily.     lovastatin 20 MG tablet  Commonly known as:  MEVACOR  Take 2 tablets by mouth daily.     metFORMIN 1000 MG tablet  Commonly known as:  GLUCOPHAGE  Take 1,000 mg by mouth 2 (two) times daily with a meal.     potassium chloride 10 MEQ tablet  Commonly known as:  K-DUR  Take 1 tablet (10 mEq total) by mouth daily.       No Known Allergies Follow-up Information   Follow up with Laqueta LindenKONESWARAN, SURESH A, MD. (office will call for follow up appointment)  Specialty:  Cardiology   Contact information:   [address] [place] [address] [phone]       Follow up with Lake Bells, PA-C. Schedule an appointment as soon as possible for a visit in 2 weeks. (for evaluation of symptoms. recommend bmet to track potassium and evaluate glycemic control)    Specialty:  Internal Medicine   Contact information:   [address] [place] [address] [phone]       Follow up with [facility], KOFI, MD. Schedule an appointment as soon as possible for a visit in 2 months. (follow up on CVA evaluate treatment pland and adjust as indicated)    Specialty:  Neurology   Contact information:   [address] [place] [address] [phone]        The results of significant diagnostics from this  hospitalization (including imaging, microbiology, ancillary and laboratory) are listed below for reference.    Significant Diagnostic Studies: Ct Head Wo Contrast  03/24/2014   CLINICAL DATA:  History of stroke, left-sided tingling  EXAM: CT HEAD WITHOUT CONTRAST  TECHNIQUE: Contiguous axial images were obtained from the base of the skull through the vertex without intravenous contrast.  COMPARISON:  03/24/2014 MRI and 03/18/2014 CT head  FINDINGS: Calvarium is intact. Low attenuation in the right occipital lobe reflects unchanged infarct when compared to MRI performed earlier today. There is no hemorrhage or extra-axial fluid. There is no hydrocephalus or mass. Stable chronic involutional change.  IMPRESSION: Stable right occipital infarct when compared to MRI performed earlier today.   Electronically Signed   By: Esperanza Heir M.D.   On: 03/24/2014 15:24   Ct Head Wo Contrast  03/18/2014   CLINICAL DATA:  Left-sided numbness and tingling  EXAM: CT HEAD WITHOUT CONTRAST  TECHNIQUE: Contiguous axial images were obtained from the base of the skull through the vertex without intravenous contrast.  COMPARISON:  None.  FINDINGS: The bony calvarium is intact. No findings to suggest acute hemorrhage or acute infarction are seen. There are encephalomalacia changes noted in the right occipital lobe which may be related to prior infarct. No acute infarct is identified.  IMPRESSION: Changes most consistent with a prior right occipital infarct. No acute abnormality is seen.   Electronically Signed   By: Alcide Clever M.D.   On: 03/18/2014 14:38   Mr Maxine Glenn Head Wo Contrast  03/24/2014   CLINICAL DATA:  Follow-up abnormal MRI. Acute/subacute right posterior cerebral artery territory infarct.  EXAM: MRA HEAD WITHOUT CONTRAST  TECHNIQUE: Angiographic images of the Circle of Willis were obtained using MRA technique without intravenous contrast.  COMPARISON:  MRI of the head March 24, 2014  FINDINGS: Trace T1 shortening  within right occipital lobe corresponding to known infarct and petechial hemorrhage.  Anterior circulation: Normal flow related enhancement of the and included cervical, petrous, cavernous and supra clinoid internal carotid arteries. Normal flow related enhancement anterior and middle cerebral arteries with patent anterior communicating artery.  Posterior circulation: Nearly codominant vertebral arteries, with normal flow early enhancement of the vertebral arteries, basilar artery main branch vessels. Patent bilateral P1 segments, complete loss the proximal P2 flow related enhancement. Tiny more distal right posterior cerebral artery vessels suggests collateralization. Posterior communicating arteries are not definitely present.  No aneurysm or suspicious luminal irregularity.  IMPRESSION: Slow flow versus versus occluded proximal right P2 segment with minimal enhancement of the distal P2 and P3 segments suggesting collateralization.  Otherwise unremarkable MRA of the intracranial vessels.  Loretta subacute petechial  hemorrhage right occipital lobe.   Electronically Signed   By: Awilda Metro   On: 03/24/2014 23:35   Mr Brain Wo Contrast  03/24/2014   CLINICAL DATA:  54 year old female with left side weakness and numbness since 03/14/2014. No known injury. Initial encounter. History of hypertension, diabetes, hyperlipidemia.  EXAM: MRI HEAD WITHOUT CONTRAST  TECHNIQUE: Multiplanar, multiecho pulse sequences of the brain and surrounding structures were obtained without intravenous contrast.  COMPARISON:  Head CT without contrast 03/18/2014.  FINDINGS: Confluent restricted diffusion in the right occipital pole, corresponding to the hypodense area on the recent CT. Patchy cortical and white matter involvement (series 100, image 12). There is also a tiny medial cortical focus of restricted diffusion in the inferior right parietal lobe (series 100, image 20).  No contralateral or posterior fossa restricted  diffusion.  The above diffusion changes are associated with heterogeneous T2 and FLAIR hyperintensity, mild regional mass effect, and also intrinsic T1 signal and subtle T2 * heterogeneity (series 8, image 46).  Major intracranial vascular flow voids are preserved, except for the right PCA.  No ventriculomegaly. No other intracranial hemorrhage. No midline shift. Basilar cisterns are patent. Negative pituitary, cervicomedullary junction and visualized cervical spine. Scattered supratentorial nonspecific T2 and FLAIR hyperintensity (series 7, image 19), moderate for age. No cortical encephalomalacia identified.  Visualized orbit soft tissues are within normal limits. Visualized paranasal sinuses and mastoids are clear. Visible internal auditory structures appear normal. Visualized scalp soft tissues are within normal limits. Visualized bone marrow signal is within normal limits.  IMPRESSION: 1. Acute/subacute right PCA infarct. Mild petechial hemorrhage and edema. No significant mass effect. 2. Evidence of right PCA occlusion, congruent with the above. 3. Moderate for age nonspecific cerebral white matter signal changes, may reflect chronic small vessel disease in this setting. Study discussed by telephone with Dr. Theodis Shove on 03/24/2014 at 10:28 .   Electronically Signed   By: Augusto Gamble M.D.   On: 03/24/2014 10:31   US Carotid Bilateral  03/24/2014   CLINICAL DATA:  TIA symptoms, visual disturbance, hyperlipidemia, diabetes, acute right PCA occipital infarcts  EXAM: BILATERAL CAROTID DUPLEX ULTRASOUND  TECHNIQUE: Wallace Cullens scale imaging, color Doppler and duplex ultrasound were performed of bilateral carotid and vertebral arteries in the neck.  COMPARISON:  None.  FINDINGS: Criteria: Quantification of carotid stenosis is based on velocity parameters that correlate the residual internal carotid diameter with NASCET-based stenosis levels, using the diameter of the distal internal carotid lumen as the denominator  for stenosis measurement.  The following velocity measurements were obtained:  RIGHT  ICA:  69/23 cm/sec  CCA:  64/13 cm/sec  SYSTOLIC ICA/CCA RATIO:  1.06  DIASTOLIC ICA/CCA RATIO:  1.76  ECA:  101 cm/sec  LEFT  ICA:  75/21 cm/sec  CCA:  76/17 cm/sec  SYSTOLIC ICA/CCA RATIO:  0.99  DIASTOLIC ICA/CCA RATIO:  1.23  ECA:  87 cm/sec  RIGHT CAROTID ARTERY: Minor echogenic shadowing plaque formation. No hemodynamically significant right ICA stenosis, velocity elevation, or turbulent flow. Degree of narrowing less than 50%.  RIGHT VERTEBRAL ARTERY:  Antegrade  LEFT CAROTID ARTERY: Similar scattered minor echogenic plaque formation. No hemodynamically significant left ICA stenosis, velocity elevation, or turbulent flow.  LEFT VERTEBRAL ARTERY:  Antegrade  IMPRESSION: Minor carotid atherosclerosis. No hemodynamically significant ICA stenosis detected by ultrasound.   Electronically Signed   By: Ruel Favors M.D.   On: 03/24/2014 18:53    Microbiology: No results found for this or any previous visit (from the past  240 hour(s)).   Labs: Basic Metabolic Panel:  Recent Labs Lab 03/24/14 1118 03/25/14 0531  NA 140 141  K 2.9* 3.4*  CL 99 101  CO2 29 29  GLUCOSE 170* 112*  BUN 12 12  CREATININE 0.75 0.84  CALCIUM 9.3 8.8   Liver Function Tests:  Recent Labs Lab 03/24/14 1118  AST 20  ALT 17  ALKPHOS 62  BILITOT 0.2*  PROT 7.5  ALBUMIN 3.5   No results found for this basename: LIPASE, AMYLASE,  in the last 168 hours No results found for this basename: AMMONIA,  in the last 168 hours CBC:  Recent Labs Lab 03/24/14 1118  WBC 6.9  NEUTROABS 2.8  HGB 12.7  HCT 38.1  MCV 80.0  PLT 319   Cardiac Enzymes:  Recent Labs Lab 03/24/14 1833 03/25/14 0531  TROPONINI <0.30 <0.30   BNP: BNP (last 3 results) No results found for this basename: PROBNP,  in the last 8760 hours CBG:  Recent Labs Lab 03/24/14 1656 03/24/14 2033 03/25/14 0724 03/25/14 1150  GLUCAP 109* 129* 108* 166*        Signed:  Dontrelle Mazon M  [facility] 03/25/2014, 2:45 PM

## 2014-03-25 NOTE — Discharge Summary (Signed)
Patient seen, independently examined and chart reviewed. I agree with exam, assessment and plan discussed with Toya Smothers, NP.  Subjective: Feels well. She has no paresthesias, no weakness, no difficulty speaking or swallowing.  Objective: Afebrile, vital signs stable.  Gen. Appears calm and comfortable.  Psych. Alert. Speech fluent and clear. Grossly normal mentation.  Eyes. Pupils equal, round, reactive to light.  Cardiovascular. Regular rate and rhythm. No murmur, or gallop. No lower extremity edema.  Respiratory. Clear to auscultation bilaterally. No wheezes, rales or rhonchi. Normal respiratory effort.  Musculoskeletal. Grossly normal tone and strength all extremities. No upper chemistry dysdiadochokinesis. No pronator drift.  Neurologic. Cranial nerves grossly normal. No focal neurologic deficits seen.   Chemistry: potassium 3.4, troponin negative, LDL 100  Imaging: Chronic ultrasound unremarkable. MRI with slow flow versus occlusion of the segment of right P2, collateral suggested.  Doing very well today. No focal neurologic deficits. Evaluated by physical and occupational therapy, has no needs. She is swallowing without difficulty and has normal speech and apparently normal language function. I do not think there is any role for speech therapy at this point. Case was discussed with Dr. Amada Jupiter neurology who has reviewed the imaging studies and suggest this is most likely thrombotic disease, small vessel disease and recommends full-strength aspirin and outpatient followup in 2 months.  Echocardiogram results noted and discussed with Dr. Beulah Gandy who will arrange outpatient followup with cardiology for further recommendations and suggest low-dose beta blocker if blood pressure can tolerate.   Discharge home  Full-strength aspirin  Continue statin  Add low-dose Coreg 3.125 mg BID if BP can tolerate  Followup with neurology Dr. Gerilyn Pilgrim in 2 months  Note patient is  not a candidate for TPA secondary to delay in presentation and no objective deficits.  Brendia Sacks, MD [facility] [phone]

## 2014-03-26 NOTE — Progress Notes (Signed)
Ms.Giangregorio,received Diabetic,and Nutritional education today,verbalized understanding of information given.Patient also watched the diabetic videos as instructed by,Diabetes Coordinator,Living well with diabetes booklet given to patient .Discharge instructions given on medications,and follow up visits given.No c/o pain or discomfort noted.

## 2014-04-13 ENCOUNTER — Ambulatory Visit (INDEPENDENT_AMBULATORY_CARE_PROVIDER_SITE_OTHER): Payer: BC Managed Care – PPO | Admitting: Cardiovascular Disease

## 2014-04-13 ENCOUNTER — Encounter: Payer: BC Managed Care – PPO | Admitting: Cardiology

## 2014-04-13 ENCOUNTER — Encounter: Payer: Self-pay | Admitting: Cardiovascular Disease

## 2014-04-13 VITALS — BP 142/90 | HR 76 | Ht 62.0 in | Wt 235.0 lb

## 2014-04-13 DIAGNOSIS — I6350 Cerebral infarction due to unspecified occlusion or stenosis of unspecified cerebral artery: Secondary | ICD-10-CM

## 2014-04-13 DIAGNOSIS — I429 Cardiomyopathy, unspecified: Secondary | ICD-10-CM

## 2014-04-13 DIAGNOSIS — E785 Hyperlipidemia, unspecified: Secondary | ICD-10-CM

## 2014-04-13 DIAGNOSIS — I639 Cerebral infarction, unspecified: Secondary | ICD-10-CM

## 2014-04-13 DIAGNOSIS — R0602 Shortness of breath: Secondary | ICD-10-CM

## 2014-04-13 DIAGNOSIS — R5383 Other fatigue: Secondary | ICD-10-CM

## 2014-04-13 DIAGNOSIS — I428 Other cardiomyopathies: Secondary | ICD-10-CM

## 2014-04-13 DIAGNOSIS — I10 Essential (primary) hypertension: Secondary | ICD-10-CM

## 2014-04-13 DIAGNOSIS — R5381 Other malaise: Secondary | ICD-10-CM

## 2014-04-13 MED ORDER — CARVEDILOL 3.125 MG PO TABS: 3.1250 mg | ORAL_TABLET | Freq: Two times a day (BID) | ORAL | Status: DC

## 2014-04-13 NOTE — Progress Notes (Signed)
Patient ID: Crystal Snow, female   DOB: 12-31-1960, 53 y.o.   MRN: [id number]       CARDIOLOGY CONSULT NOTE  Patient ID: Crystal Snow MRN: [id number] DOB/AGE: November 26, 1960 53 y.o.  Admit date: (Not on file) Primary Physician Lake Bells, PA-C  Reason for Consultation: cardiomyopathy  HPI: The patient is a 53 year old woman with a history of essential hypertension, hyperlipidemia and diabetes mellitus was hospitalized for an acute right PCA infarct in early August. An echocardiogram demonstrated mild to moderately reduced left ventricular systolic function, EF 40-45%, global hypokinesis, mild LVH with grade 1 diastolic dysfunction, and mild aortic regurgitation. As her blood pressure was low normal at the time of discharge, carvedilol was not initiated. She is on lisinopril. She saw neurology and she was deemed to have an ischemic-thrombotic stroke and it was recommended that she undergo cardiac event monitoring, presumably to detect atrial fibrillation. ECG on 03/24/2014 demonstrated normal sinus rhythm with a diffuse nonspecific T wave abnormality. She was placed on ASA 325 mg daily. Carotid Dopplers were unremarkable. She denies any prior history of chest pain. She said she becomes dyspneic with exertion but this has been going on for years. She denies orthopnea and paroxysmal nocturnal dyspnea. She experiences a sensation of "pounding in the chest" primarily with exertion but denies palpitations at rest. She had some lightheadedness and dizziness at the time of her stroke but none since. She occasionally has ankle swelling. She denies a history of myocardial infarction.  Soc: Nonsmoker.   No Known Allergies  Current Outpatient Prescriptions  Medication Sig Dispense Refill  . aspirin 325 MG tablet Take 1 tablet (325 mg total) by mouth daily.      Marland Kitchen lisinopril-hydrochlorothiazide (PRINZIDE,ZESTORETIC) 20-12.5 MG per tablet Take 1 tablet by mouth daily.      Marland Kitchen lovastatin  (MEVACOR) 20 MG tablet Take 2 tablets by mouth daily.      . metFORMIN (GLUCOPHAGE) 1000 MG tablet Take 1,000 mg by mouth 2 (two) times daily with a meal.      . potassium chloride (K-DUR) 10 MEQ tablet Take 1 tablet (10 mEq total) by mouth daily.  30 tablet  1   No current facility-administered medications for this visit.    Past Medical History  Diagnosis Date  . Hypertension   . Diabetes mellitus without complication   . CVA (cerebral infarction)     03/2014  . Cholecystitis     lap chole 2006    Past Surgical History  Procedure Laterality Date  . Abdominal hysterectomy      History   Social History  . Marital Status: Single    Spouse Name: N/A    Number of Children: N/A  . Years of Education: N/A   Occupational History  . Not on file.   Social History Main Topics  . Smoking status: Never Smoker   . Smokeless tobacco: Not on file  . Alcohol Use: No  . Drug Use: No  . Sexual Activity: Not on file   Other Topics Concern  . Not on file   Social History Narrative  . No narrative on file     No family history of premature CAD in 1st degree relatives.  Prior to Admission medications   Medication Sig Start Date End Date Taking? Authorizing Provider  aspirin 325 MG tablet Take 1 tablet (325 mg total) by mouth daily. 03/25/14  Yes Lesle Chris Black, NP  lisinopril-hydrochlorothiazide (PRINZIDE,ZESTORETIC) 20-12.5 MG per tablet Take 1 tablet by mouth daily.  03/04/14  Yes Historical Provider, MD  lovastatin (MEVACOR) 20 MG tablet Take 2 tablets by mouth daily. 03/04/14  Yes Historical Provider, MD  metFORMIN (GLUCOPHAGE) 1000 MG tablet Take 1,000 mg by mouth 2 (two) times daily with a meal.   Yes Historical Provider, MD  potassium chloride (K-DUR) 10 MEQ tablet Take 1 tablet (10 mEq total) by mouth daily. 03/25/14  Yes Gwenyth Bender, NP     Review of systems complete and found to be negative unless listed above in HPI     Physical exam Blood pressure 142/90, pulse 76,  height 5\' 2"  (1.575 m), weight 235 lb (106.595 kg), SpO2 99.00%. General: NAD Neck: No JVD, no thyromegaly or thyroid nodule.  Lungs: Clear to auscultation bilaterally with normal respiratory effort. CV: Nondisplaced PMI. Regular rate and rhythm, normal S1/S2, no S3/S4, no murmur.  No peripheral edema.  No carotid bruit.  Normal pedal pulses.  Abdomen: Soft, nontender, no hepatosplenomegaly, no distention.  Skin: Intact without lesions or rashes.  Neurologic: Alert and oriented x 3.  Psych: Normal affect. Extremities: No clubbing or cyanosis.  HEENT: Normal.   ECG: Most recent ECG reviewed.  Labs:   Lab Results  Component Value Date   WBC 6.9 03/24/2014   HGB 12.7 03/24/2014   HCT 38.1 03/24/2014   MCV 80.0 03/24/2014   PLT 319 03/24/2014   No results found for this basename: NA, K, CL, CO2, BUN, CREATININE, CALCIUM, LABALBU, PROT, BILITOT, ALKPHOS, ALT, AST, GLUCOSE,  in the last 168 hours Lab Results  Component Value Date   TROPONINI <0.30 03/25/2014    Lab Results  Component Value Date   CHOL 196 03/24/2014   Lab Results  Component Value Date   HDL 58 03/24/2014   Lab Results  Component Value Date   LDLCALC 100* 03/24/2014   Lab Results  Component Value Date   TRIG 189* 03/24/2014   Lab Results  Component Value Date   CHOLHDL 3.4 03/24/2014   No results found for this basename: LDLDIRECT         Studies: No results found.  ASSESSMENT AND PLAN:  1. Cardiomyopathy: There is no known history of myocardial infarction. She does complain of exertional dyspnea and fatigue. She is already on lisinopril. I will start carvedilol 3.125 mg twice daily. I will also proceed with a Lexiscan Cardiolite stress test to evaluate for an ischemic etiology. She will need a reassessment of cardiac function in several months.  2. Acute right PCA deemed ischemic-thrombotic in etiology: As per neurology's recommendation, I will proceed with a 30 day event monitor to evaluate for atrial fibrillation.  Her risk factors include obesity, essential hypertension and diabetes mellitus.  3. Essential HTN: Mildly elevated today. Given her history of CVA and diabetes mellitus with newly diagnosed cardiomyopathy, she will need aggressive control. I will start Coreg 3.125 mg bid.  4. Hyperlipidemia: Continue statin therapy. If she is deemed to have CAD, I would switch lovastatin to moderate statin therapy with Lipitor. LDL 100 on 8/6.  Dispo: f/u 6-8 weeks.   Signed: Prentice Docker, M.D., F.A.C.C.  04/13/2014, 1:44 PM

## 2014-04-13 NOTE — Patient Instructions (Signed)
Your physician recommends that you schedule a follow-up appointment in: 6-8 weeks with Dr. Purvis Sheffield  Your physician has recommended you make the following change in your medication:   START COREG 3.125 MG TWICE DAILY  Your physician has recommended that you wear an event monitor for 30 days. Event monitors are medical devices that record the heart's electrical activity. Doctors most often [place] these monitors to diagnose arrhythmias. Arrhythmias are problems with the speed or rhythm of the heartbeat. The monitor is a small, portable device. You can wear one while you do your normal daily activities. This is usually used to diagnose what is causing palpitations/syncope (passing out).  Your physician has requested that you have a lexiscan myoview. For further information please visit [email or web address] Please follow instruction sheet, as given.  Thank you for choosing [facility]!!

## 2014-04-20 ENCOUNTER — Encounter (HOSPITAL_COMMUNITY): Admission: RE | Admit: 2014-04-20 | Payer: BC Managed Care – PPO | Source: Ambulatory Visit

## 2014-04-20 ENCOUNTER — Ambulatory Visit (HOSPITAL_COMMUNITY): Admission: RE | Admit: 2014-04-20 | Payer: BC Managed Care – PPO | Source: Ambulatory Visit

## 2014-04-20 ENCOUNTER — Encounter (HOSPITAL_COMMUNITY)
Admission: RE | Admit: 2014-04-20 | Discharge: 2014-04-20 | Disposition: A | Discharge status: Home / Self Care | Payer: BC Managed Care – PPO | Source: Ambulatory Visit | Attending: Cardiovascular Disease | Admitting: Cardiovascular Disease

## 2014-04-20 DIAGNOSIS — I429 Cardiomyopathy, unspecified: Secondary | ICD-10-CM

## 2014-04-21 ENCOUNTER — Encounter (HOSPITAL_COMMUNITY)
Admission: RE | Admit: 2014-04-21 | Discharge: 2014-04-21 | Disposition: A | Discharge status: Home / Self Care | Payer: BC Managed Care – PPO | Source: Ambulatory Visit | Attending: Cardiovascular Disease | Admitting: Cardiovascular Disease

## 2014-04-21 ENCOUNTER — Ambulatory Visit (HOSPITAL_COMMUNITY)
Admission: RE | Admit: 2014-04-21 | Discharge: 2014-04-21 | Disposition: A | Discharge status: Home / Self Care | Payer: BC Managed Care – PPO | Source: Ambulatory Visit | Attending: Cardiovascular Disease | Admitting: Cardiovascular Disease

## 2014-04-21 ENCOUNTER — Encounter (HOSPITAL_COMMUNITY)
Admission: RE | Admit: 2014-04-21 | Discharge: 2014-04-21 | Disposition: A | Discharge status: Home / Self Care | Payer: BC Managed Care – PPO | Source: Ambulatory Visit | Attending: Cardiology | Admitting: Cardiology

## 2014-04-21 ENCOUNTER — Encounter (HOSPITAL_COMMUNITY): Payer: Self-pay

## 2014-04-21 DIAGNOSIS — I5020 Unspecified systolic (congestive) heart failure: Secondary | ICD-10-CM | POA: Insufficient documentation

## 2014-04-21 DIAGNOSIS — I428 Other cardiomyopathies: Secondary | ICD-10-CM

## 2014-04-21 DIAGNOSIS — R9439 Abnormal result of other cardiovascular function study: Secondary | ICD-10-CM | POA: Diagnosis not present

## 2014-04-21 MED ORDER — TECHNETIUM TC 99M SESTAMIBI - CARDIOLITE
10.0000 | Freq: Once | INTRAVENOUS | Status: AC | PRN
  Administered 2014-04-21: 08:00:00 10 via INTRAVENOUS

## 2014-04-21 MED ORDER — REGADENOSON 0.4 MG/5ML IV SOLN
INTRAVENOUS | Status: AC
  Administered 2014-04-21: 0.4 mg via INTRAVENOUS
  Filled 2014-04-21: qty 5

## 2014-04-21 MED ORDER — REGADENOSON 0.4 MG/5ML IV SOLN
0.4000 mg | Freq: Once | INTRAVENOUS | Status: AC | PRN
  Administered 2014-04-21: 0.4 mg via INTRAVENOUS

## 2014-04-21 MED ORDER — SODIUM CHLORIDE 0.9 % IJ SOLN
10.0000 mL | INTRAMUSCULAR | Status: DC | PRN
  Administered 2014-04-21: 10 mL via INTRAVENOUS

## 2014-04-21 MED ORDER — TECHNETIUM TC 99M SESTAMIBI GENERIC - CARDIOLITE
30.0000 | Freq: Once | INTRAVENOUS | Status: AC | PRN
  Administered 2014-04-21: 30 via INTRAVENOUS

## 2014-04-21 MED ORDER — SODIUM CHLORIDE 0.9 % IJ SOLN
INTRAMUSCULAR | Status: AC
  Administered 2014-04-21: 10 mL via INTRAVENOUS
  Filled 2014-04-21: qty 10

## 2014-04-21 NOTE — Progress Notes (Signed)
Stress Lab Nurses Notes - [facility]  MELEAH STUMPH 04/21/2014 Reason for doing test: Cardiomyopathy, exertional dyspnea & fatigue Type of test: Marlane Hatcher Nurse performing test: Parke Poisson, RN Nuclear Medicine Tech: Lyndel Pleasure Echo Tech: Not Applicable MD performing test: Branch/K.Lyman Bishop NP Family MD: Anabel Bene PA- C Test explained and consent signed: Yes.   IV started: Saline lock flushed, No redness or edema and Saline lock started in radiology Symptoms: "Felt Funny in the head" Treatment/Intervention: None Reason test stopped: protocol completed After recovery IV was: Discontinued via X-ray tech and No redness or edema Patient to return to Nuc. Med at : 9:45 Patient discharged: Home Patient's Condition upon discharge was: stable Comments: During test BP 164/83 & HR 100. Recovery BP 160/86 & HR 89.  Symptoms resolved in recovery.  Erskine Speed T

## 2014-04-27 ENCOUNTER — Encounter (HOSPITAL_COMMUNITY): Payer: Self-pay | Admitting: Emergency Medicine

## 2014-04-27 ENCOUNTER — Emergency Department (HOSPITAL_COMMUNITY)
Admission: EM | Admit: 2014-04-27 | Discharge: 2014-04-27 | Disposition: A | Discharge status: Home / Self Care | Payer: BC Managed Care – PPO | Attending: Emergency Medicine | Admitting: Emergency Medicine

## 2014-04-27 ENCOUNTER — Emergency Department (HOSPITAL_COMMUNITY): Payer: BC Managed Care – PPO

## 2014-04-27 DIAGNOSIS — E119 Type 2 diabetes mellitus without complications: Secondary | ICD-10-CM | POA: Diagnosis not present

## 2014-04-27 DIAGNOSIS — I10 Essential (primary) hypertension: Secondary | ICD-10-CM | POA: Diagnosis not present

## 2014-04-27 DIAGNOSIS — R209 Unspecified disturbances of skin sensation: Secondary | ICD-10-CM | POA: Diagnosis not present

## 2014-04-27 DIAGNOSIS — Z7982 Long term (current) use of aspirin: Secondary | ICD-10-CM | POA: Insufficient documentation

## 2014-04-27 DIAGNOSIS — Z8673 Personal history of transient ischemic attack (TIA), and cerebral infarction without residual deficits: Secondary | ICD-10-CM | POA: Diagnosis not present

## 2014-04-27 DIAGNOSIS — Z8719 Personal history of other diseases of the digestive system: Secondary | ICD-10-CM | POA: Insufficient documentation

## 2014-04-27 DIAGNOSIS — R200 Anesthesia of skin: Secondary | ICD-10-CM

## 2014-04-27 DIAGNOSIS — Z79899 Other long term (current) drug therapy: Secondary | ICD-10-CM | POA: Insufficient documentation

## 2014-04-27 LAB — CBC
HCT: 41.5 % (ref 36.0–46.0)
Hemoglobin: 14.1 g/dL (ref 12.0–15.0)
MCH: 27.4 pg (ref 26.0–34.0)
MCHC: 34 g/dL (ref 30.0–36.0)
MCV: 80.6 fL (ref 78.0–100.0)
Platelets: 360 10*3/uL (ref 150–400)
RBC: 5.15 MIL/uL — AB (ref 3.87–5.11)
RDW: 14.8 % (ref 11.5–15.5)
WBC: 8.5 10*3/uL (ref 4.0–10.5)

## 2014-04-27 LAB — COMPREHENSIVE METABOLIC PANEL
ALT: 12 U/L (ref 0–35)
AST: 15 U/L (ref 0–37)
Albumin: 3.6 g/dL (ref 3.5–5.2)
Alkaline Phosphatase: 70 U/L (ref 39–117)
Anion gap: 14 (ref 5–15)
BUN: 18 mg/dL (ref 6–23)
CO2: 25 meq/L (ref 19–32)
Calcium: 9.1 mg/dL (ref 8.4–10.5)
Chloride: 100 mEq/L (ref 96–112)
Creatinine, Ser: 1.2 mg/dL — ABNORMAL HIGH (ref 0.50–1.10)
GFR, EST AFRICAN AMERICAN: 58 mL/min — AB (ref >90)
GFR, EST NON AFRICAN AMERICAN: 50 mL/min — AB (ref >90)
GLUCOSE: 206 mg/dL — AB (ref 70–99)
POTASSIUM: 3.9 meq/L (ref 3.7–5.3)
SODIUM: 139 meq/L (ref 137–147)
Total Bilirubin: 0.2 mg/dL — ABNORMAL LOW (ref 0.3–1.2)
Total Protein: 7.6 g/dL (ref 6.0–8.3)

## 2014-04-27 LAB — DIFFERENTIAL
Basophils Absolute: 0 10*3/uL (ref 0.0–0.1)
Basophils Relative: 0 % (ref 0–1)
Eosinophils Absolute: 0.1 10*3/uL (ref 0.0–0.7)
Eosinophils Relative: 1 % (ref 0–5)
LYMPHS ABS: 3 10*3/uL (ref 0.7–4.0)
LYMPHS PCT: 36 % (ref 12–46)
Monocytes Absolute: 0.5 10*3/uL (ref 0.1–1.0)
Monocytes Relative: 6 % (ref 3–12)
NEUTROS ABS: 4.9 10*3/uL (ref 1.7–7.7)
Neutrophils Relative %: 57 % (ref 43–77)

## 2014-04-27 LAB — I-STAT CHEM 8, ED
BUN: 18 mg/dL (ref 6–23)
CALCIUM ION: 1.14 mmol/L (ref 1.12–1.23)
CHLORIDE: 104 meq/L (ref 96–112)
Creatinine, Ser: 1.2 mg/dL — ABNORMAL HIGH (ref 0.50–1.10)
Glucose, Bld: 214 mg/dL — ABNORMAL HIGH (ref 70–99)
HCT: 45 % (ref 36.0–46.0)
Hemoglobin: 15.3 g/dL — ABNORMAL HIGH (ref 12.0–15.0)
POTASSIUM: 3.3 meq/L — AB (ref 3.7–5.3)
SODIUM: 138 meq/L (ref 137–147)
TCO2: 24 mmol/L (ref 0–100)

## 2014-04-27 LAB — PROTIME-INR
INR: 1.03 (ref 0.00–1.49)
PROTHROMBIN TIME: 13.5 s (ref 11.6–15.2)

## 2014-04-27 LAB — APTT: APTT: 29 s (ref 24–37)

## 2014-04-27 LAB — ETHANOL

## 2014-04-27 MED ORDER — CLOPIDOGREL BISULFATE 75 MG PO TABS: 75.0000 mg | ORAL_TABLET | Freq: Every day | ORAL | Status: DC

## 2014-04-27 MED ORDER — CLOPIDOGREL BISULFATE 75 MG PO TABS
75.0000 mg | ORAL_TABLET | Freq: Once | ORAL | Status: AC
  Administered 2014-04-27: 75 mg via ORAL
  Filled 2014-04-27: qty 1

## 2014-04-27 NOTE — ED Notes (Signed)
CT aware scan order placed for pt. Pt in waiting room behind triage. nad noted.

## 2014-04-27 NOTE — ED Notes (Signed)
As pt being taken back to room. Pt reports "the tingling is coming back." nad noted at this time.

## 2014-04-27 NOTE — ED Notes (Addendum)
Pt c/o of left sided numbness head to toe x 2 weeks.  Pt reports it comes and goes, episodes last 5-10 minutes.  Denies hx of clots or cardiac problems.  Pt reports some dizziness and stinging in left leg, blurry vision in left eye with episodes.  Pt unable to identify any possible triggers.  NAD, respirations equal and unlabored, skin warm and dry.

## 2014-04-27 NOTE — Discharge Instructions (Signed)
Please follow the directions provided.  Start the Plavix along with your aspirin. Be sure to call Dr. Ronal Fear office tomorrow to make a follow-up appointment.     SEEK IMMEDIATE MEDICAL CARE IF:   You feel weak.  You have trouble walking or moving.  You have problems with speech or vision.  You feel confused.  You cannot control your bladder or bowel movements.  You feel numbness after an injury.  You faint.  Your burning or prickling feeling gets worse when walking.  You have pain, cramps, or dizziness.

## 2014-04-27 NOTE — ED Provider Notes (Signed)
CSN: [id number]     Arrival date & time 04/27/14  1504 History   First MD Initiated Contact with Patient 04/27/14 1652     Chief Complaint  Patient presents with  . Numbness   (Consider location/radiation/quality/duration/timing/severity/associated sxs/prior Treatment) HPI Crystal Snow is a 53 yo female with PMH: HTN, DM, CVA, presenting to the ED with report of left sided numbness.  She states appr 3 hours prior to arrival, she had numbness/tingling from the top of her head,extending through her left arm and down her left leg to her toes.  She was watching TV when it started and lasted about 5-10 min.  It resolved on its own and has not returned.  She states she had some blurry vision during the episode but is normal now.  The last time she had similar sensation was last month when she was diagnosed with a right occipital stroke.  She was discharged on 03/25/14 without any residual effects. She has been on a daily ASA which she has taken without missing any doses.  She denies pain at any time, nausea, vomiting, weakness or altered mental status.  Past Medical History  Diagnosis Date  . Hypertension   . Diabetes mellitus without complication   . CVA (cerebral infarction)     03/2014  . Cholecystitis     lap chole 2006   Past Surgical History  Procedure Laterality Date  . Abdominal hysterectomy     History reviewed. No pertinent family history. History  Substance Use Topics  . Smoking status: Never Smoker   . Smokeless tobacco: Not on file  . Alcohol Use: No   OB History   Grav Para Term Preterm Abortions TAB SAB Ect Mult Living                 Review of Systems  Constitutional: Negative for fever and chills.  HENT: Negative for sore throat.   Eyes: Negative for visual disturbance.  Respiratory: Negative for cough and shortness of breath.   Cardiovascular: Negative for chest pain and leg swelling.  Gastrointestinal: Negative for nausea, vomiting and diarrhea.   Genitourinary: Negative for dysuria.  Musculoskeletal: Negative for myalgias.  Skin: Negative for rash.  Neurological: Positive for numbness. Negative for weakness and headaches.    Allergies  Review of patient's allergies indicates no known allergies.  Home Medications   Prior to Admission medications   Medication Sig Start Date End Date Taking? Authorizing Provider  aspirin 325 MG tablet Take 1 tablet (325 mg total) by mouth daily. 03/25/14   Gwenyth Bender, NP  carvedilol (COREG) 3.125 MG tablet Take 1 tablet (3.125 mg total) by mouth 2 (two) times daily. 04/13/14   Laqueta Linden, MD  lisinopril-hydrochlorothiazide (PRINZIDE,ZESTORETIC) 20-12.5 MG per tablet Take 1 tablet by mouth daily. 03/04/14   Historical Provider, MD  lovastatin (MEVACOR) 20 MG tablet Take 2 tablets by mouth daily. 03/04/14   Historical Provider, MD  metFORMIN (GLUCOPHAGE) 1000 MG tablet Take 1,000 mg by mouth 2 (two) times daily with a meal.    Historical Provider, MD  potassium chloride (K-DUR) 10 MEQ tablet Take 1 tablet (10 mEq total) by mouth daily. 03/25/14   Gwenyth Bender, NP   BP 121/78  Pulse 88  Temp(Src) 98.7 F (37.1 C) (Oral)  Resp 18  Ht  (1.575 m)  Wt 205 lb (92.987 kg)  BMI 37.49 kg/m2  SpO2 98% Physical Exam  Nursing note and vitals reviewed. Constitutional: She is oriented to person,  place, and time. She appears well-developed and well-nourished. No distress.  HENT:  Head: Normocephalic and atraumatic.  Mouth/Throat: Oropharynx is clear and moist. No oropharyngeal exudate.  Eyes: Conjunctivae and EOM are normal. Pupils are equal, round, and reactive to light. No scleral icterus.  Neck: Neck supple. No thyromegaly present.  Cardiovascular: Normal rate, regular rhythm and intact distal pulses.   Pulmonary/Chest: Effort normal and breath sounds normal. No respiratory distress. She has no wheezes. She has no rales. She exhibits no tenderness.  Abdominal: Soft. There is no tenderness.   Musculoskeletal: She exhibits no tenderness.  Lymphadenopathy:    She has no cervical adenopathy.  Neurological: She is alert and oriented to person, place, and time. She has normal strength. No cranial nerve deficit or sensory deficit. Coordination and gait normal. GCS eye subscore is 4. GCS verbal subscore is 5. GCS motor subscore is 6.  Reflex Scores:      Patellar reflexes are 2+ on the right side and 2+ on the left side. Skin: Skin is warm and dry. No rash noted. She is not diaphoretic.  Psychiatric: She has a normal mood and affect.    ED Course  Procedures (including critical care time) Labs Review Labs Reviewed  CBC - Abnormal; Notable for the following:    RBC 5.15 (*)    All other components within normal limits  COMPREHENSIVE METABOLIC PANEL - Abnormal; Notable for the following:    Glucose, Bld 206 (*)    Creatinine, Ser 1.20 (*)    Total Bilirubin 0.2 (*)    GFR calc non Af Amer 50 (*)    GFR calc Af Amer 58 (*)    All other components within normal limits  I-STAT CHEM 8, ED - Abnormal; Notable for the following:    Potassium 3.3 (*)    Creatinine, Ser 1.20 (*)    Glucose, Bld 214 (*)    Hemoglobin 15.3 (*)    All other components within normal limits  ETHANOL  PROTIME-INR  APTT  DIFFERENTIAL  URINE RAPID DRUG SCREEN (HOSP PERFORMED)  URINALYSIS, ROUTINE W REFLEX MICROSCOPIC    Imaging Review Ct Head Wo Contrast  04/27/2014   CLINICAL DATA:  Intermittent left-sided numbness.  EXAM: CT HEAD WITHOUT CONTRAST  TECHNIQUE: Contiguous axial images were obtained from the base of the skull through the vertex without intravenous contrast.  COMPARISON:  Head CT 03/24/2014 and MRI brain, same date.  FINDINGS: Stable CT appearance of the right occipital infarct. No hemorrhage is identified. No new/acute intracranial findings such as hemispheric infarction an or intracranial hemorrhage. No mass lesions. The ventricles are normal and stable. The brainstem and cerebellum are  unremarkable and unchanged.  The bony structures are intact. The paranasal sinuses and mastoid air cells are clear.  IMPRESSION: Stable CT appearance of the right occipital infarct. No new/acute intracranial findings.   Electronically Signed   By: Loralie Champagne M.D.   On: 04/27/2014 15:35     EKG Interpretation   Date/Time:  Wednesday April 27 2014 15:51:56 EDT Ventricular Rate:  90 PR Interval:  196 QRS Duration: 98 QT Interval:  380 QTC Calculation: 464 R Axis:   3 Text Interpretation:  Normal sinus rhythm Moderate voltage criteria for  LVH, may be normal variant Nonspecific T wave abnormality Abnormal ECG No  significant change since last tracing Confirmed by Bebe Shaggy  MD, DONALD  [phone]) on 04/27/2014 4:23:19 PM      MDM   Final diagnoses:  Left sided numbness  Resolved left sided numbness similar to previous stroke 1 month ago.  Pt discharged from hospital after CT, MRI, TTE and carotid dopplar study on 03/25/2014.  CBC, CMP, ETOH, PT-INR and head CT ordered in triage.  Head CT shows previous infarct but no new or acute findings.  Labs unremarkable other than elevated glucose in the presence of established DM and mildly elevated creatinine.  Consulted Neuro who recommended starting Plavix in addition to daily ASA and follow up in neuro office.  Pt continues to be symptom free throughout ED visit with no focal neuro deficits.  Doubt new infarct or ICH.  Discharge instructions include prescription for Plavix, instructions to take in addition to ASA and referral info for neuro follow-up with instructions to call in am for follow-up appointment.  Pt verbalized understanding and is in agreement with plan.  Return precautions provided.    Filed Vitals:   04/27/14 1608 04/27/14 1700 04/27/14 1730 04/27/14 1800  BP: 121/78  140/78 146/87  Pulse: 88 89 88 87  Temp: 98.7 F (37.1 C)     TempSrc:      Resp: 18     Height:      Weight:      SpO2: 98% 98% 98% 96%    Meds given in  ED:  Medications  clopidogrel (PLAVIX) tablet 75 mg (75 mg Oral Given 04/27/14 1756)    Discharge Medication List as of 04/27/2014  6:08 PM    START taking these medications   Details  clopidogrel (PLAVIX) 75 MG tablet Take 1 tablet (75 mg total) by mouth daily., Starting 04/27/2014, Until Discontinued, Print         Harle Battiest, NP 04/29/14 2012

## 2014-04-27 NOTE — ED Notes (Signed)
Pt taken to bathroom. Pt not able to urinate at this time. Pt did have BM. nad noted.

## 2014-04-27 NOTE — ED Notes (Addendum)
Pt reports left sided numbness intermittently for last several weeks. Pt reports today's episode started at 1300. Pt reports has seen pcp for same with no relief with a diagnosis "slight stroke". nad noted. Speech clear. No facial asymmetry. Pt reports blurred vision x2 weeks ago. Airway patent. No drift noted in any extremity. Neuro exam wnl except for left sided numbness.

## 2014-04-28 ENCOUNTER — Other Ambulatory Visit (HOSPITAL_COMMUNITY): Payer: Self-pay | Admitting: Respiratory Therapy

## 2014-04-28 DIAGNOSIS — G4730 Sleep apnea, unspecified: Secondary | ICD-10-CM

## 2014-05-02 NOTE — ED Provider Notes (Signed)
Medical screening examination/treatment/procedure(s) were conducted as a shared visit with non-physician practitioner(s) and myself.  I personally evaluated the patient during the encounter.   Patient with prior stroke, recent complete workup, now presents with recurrent symptoms which are mild, short lived and involve numbness and tingling in the left side of her body. On arrival the patient is completely asymptomatic and has neurologic exam showing no signs of weakness numbness ataxia difficulty speaking or visual abnormalities. Discussed with neurology who recommends increasing anticoagulation, patient in agreement.  Clinical Impression:   Final diagnoses:  Left sided numbness     Vida Roller, MD 05/02/14 1534

## 2014-05-13 ENCOUNTER — Ambulatory Visit: Payer: BC Managed Care – PPO | Attending: Neurology | Admitting: Sleep Medicine

## 2014-05-13 ENCOUNTER — Telehealth: Payer: Self-pay

## 2014-05-13 VITALS — Ht 62.0 in | Wt 215.0 lb

## 2014-05-13 DIAGNOSIS — R0989 Other specified symptoms and signs involving the circulatory and respiratory systems: Secondary | ICD-10-CM | POA: Diagnosis present

## 2014-05-13 DIAGNOSIS — G4733 Obstructive sleep apnea (adult) (pediatric): Secondary | ICD-10-CM | POA: Diagnosis not present

## 2014-05-13 DIAGNOSIS — R5383 Other fatigue: Secondary | ICD-10-CM | POA: Diagnosis present

## 2014-05-13 DIAGNOSIS — R0609 Other forms of dyspnea: Secondary | ICD-10-CM | POA: Diagnosis present

## 2014-05-13 DIAGNOSIS — G4730 Sleep apnea, unspecified: Secondary | ICD-10-CM

## 2014-05-13 DIAGNOSIS — R5381 Other malaise: Secondary | ICD-10-CM | POA: Diagnosis present

## 2014-05-13 DIAGNOSIS — G4710 Hypersomnia, unspecified: Secondary | ICD-10-CM | POA: Diagnosis present

## 2014-05-13 NOTE — Telephone Encounter (Signed)
EOS report placed on Dr.Koneswaran's desk for review 

## 2014-05-14 NOTE — Sleep Study (Signed)
  [facility] Crystal Wann A. Gerilyn Pilgrim, MD     [email or web address]        NOCTURNAL POLYSOMNOGRAM    LOCATION: SLEEP LAB FACILITY: [place]   PHYSICIAN: Cambell Stanek A. Gerilyn Snow, M.D.   DATE OF STUDY:  05/13/2014.   REFERRING PHYSICIAN: Dabney Schanz.   INDICATIONS: The patient is a 53 year old female who complains of hypersomnia, fatigue and snoring.  MEDICATIONS:  Prior to Admission medications   Medication Sig Start Date End Date Taking? Authorizing Provider  aspirin 325 MG tablet Take 1 tablet (325 mg total) by mouth daily. 03/25/14   Gwenyth Bender, NP  carvedilol (COREG) 3.125 MG tablet Take 1 tablet (3.125 mg total) by mouth 2 (two) times daily. 04/13/14   Laqueta Linden, MD  clopidogrel (PLAVIX) 75 MG tablet Take 1 tablet (75 mg total) by mouth daily. 04/27/14   Harle Battiest, NP  lisinopril-hydrochlorothiazide (PRINZIDE,ZESTORETIC) 20-12.5 MG per tablet Take 1 tablet by mouth daily. 03/04/14   Historical Provider, MD  lovastatin (MEVACOR) 20 MG tablet Take 2 tablets by mouth daily. 03/04/14   Historical Provider, MD  metFORMIN (GLUCOPHAGE) 1000 MG tablet Take 1,000 mg by mouth 2 (two) times daily with a meal.    Historical Provider, MD  potassium chloride (K-DUR) 10 MEQ tablet Take 1 tablet (10 mEq total) by mouth daily. 03/25/14   Gwenyth Bender, NP      EPWORTH SLEEPINESS SCALE: 12.   BMI: 39.   ARCHITECTURAL SUMMARY: Total recording time was 398 minutes. Sleep efficiency 68 %. Sleep latency 18 minutes. REM latency 96 minutes. Stage NI 3 %, N2 54 % and N3 16 % and REM sleep 26 %.    RESPIRATORY DATA:  Baseline oxygen saturation is 97 %. The lowest saturation is 78 %. The diagnostic AHI is 30. The RDI is 32. The REM AHI is 77.  LIMB MOVEMENT SUMMARY: PLM index 7.   ELECTROCARDIOGRAM SUMMARY: Average heart rate is 73 with no significant dysrhythmias observed.   IMPRESSION:  1. 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 A formal CPAP titration  recording is suggestive. 2. Mild periodic limb movement disorder.  Thanks for this referral.  Crystal Snow, M.D. Diplomat, Biomedical engineer of Sleep Medicine.

## 2014-06-01 ENCOUNTER — Ambulatory Visit (INDEPENDENT_AMBULATORY_CARE_PROVIDER_SITE_OTHER): Payer: BC Managed Care – PPO | Admitting: Cardiovascular Disease

## 2014-06-01 ENCOUNTER — Encounter: Payer: Self-pay | Admitting: Cardiovascular Disease

## 2014-06-01 VITALS — BP 152/100 | HR 80 | Ht 62.0 in | Wt 235.0 lb

## 2014-06-01 DIAGNOSIS — I639 Cerebral infarction, unspecified: Secondary | ICD-10-CM

## 2014-06-01 DIAGNOSIS — I10 Essential (primary) hypertension: Secondary | ICD-10-CM

## 2014-06-01 DIAGNOSIS — E785 Hyperlipidemia, unspecified: Secondary | ICD-10-CM

## 2014-06-01 DIAGNOSIS — I429 Cardiomyopathy, unspecified: Secondary | ICD-10-CM

## 2014-06-01 MED ORDER — CARVEDILOL 6.25 MG PO TABS: ORAL_TABLET | ORAL | Status: DC

## 2014-06-01 MED ORDER — CARVEDILOL 6.25 MG PO TABS: 6.2500 mg | ORAL_TABLET | Freq: Two times a day (BID) | ORAL | Status: DC

## 2014-06-01 NOTE — Progress Notes (Signed)
Patient ID: Crystal Snow, female   DOB: [birth year], 53 y.o.   MRN: [id number]      SUBJECTIVE: The patient returns for followup on cardiovascular testing. Lexiscan Cardiolite stress test did not demonstrate any evidence of myocardial ischemia or scar. It monitoring demonstrated normal sinus rhythm with no evidence of atrial fibrillation. She denies chest pain and palpitations as well as leg swelling. She was evaluated in the emergency room in mid September for left-sided tingling and numbness of the entire side of her body. Head CT showed old occipital infarct with no new ischemic events.  Review of Systems: As per "subjective", otherwise negative.  No Known Allergies  Current Outpatient Prescriptions  Medication Sig Dispense Refill  . aspirin 325 MG tablet Take 1 tablet (325 mg total) by mouth daily.      . clopidogrel (PLAVIX) 75 MG tablet Take 1 tablet (75 mg total) by mouth daily.  30 tablet  1  . lisinopril-hydrochlorothiazide (PRINZIDE,ZESTORETIC) 20-12.5 MG per tablet Take 1 tablet by mouth daily.      Marland Kitchen. lovastatin (MEVACOR) 20 MG tablet Take 2 tablets by mouth daily.      . metFORMIN (GLUCOPHAGE) 1000 MG tablet Take 1,000 mg by mouth 2 (two) times daily with a meal.      . ONETOUCH DELICA LANCETS 33G MISC       . ONETOUCH VERIO test strip       . potassium chloride (K-DUR) 10 MEQ tablet Take 1 tablet (10 mEq total) by mouth daily.  30 tablet  1  . carvedilol (COREG) 6.25 MG tablet Take 1 tablet (6.25 mg total) by mouth 2 (two) times daily with a meal.  60 tablet  6   No current facility-administered medications for this visit.    Past Medical History  Diagnosis Date  . Hypertension   . Diabetes mellitus without complication   . CVA (cerebral infarction)     03/2014  . Cholecystitis     lap chole 2006    Past Surgical History  Procedure Laterality Date  . Abdominal hysterectomy      History   Social History  . Marital Status: Single    Spouse Name: N/A   Number of Children: N/A  . Years of Education: N/A   Occupational History  . Not on file.   Social History Main Topics  . Smoking status: Never Smoker   . Smokeless tobacco: Never Used  . Alcohol Use: No  . Drug Use: No  . Sexual Activity: Not on file   Other Topics Concern  . Not on file   Social History Narrative  . No narrative on file     Filed Vitals:   06/01/14 0800  BP: 152/100  Pulse: 80  Height: 5\' 2"  (1.575 m)  Weight: 235 lb (106.595 kg)    PHYSICAL EXAM General: NAD, obese. HEENT: Normal. Neck: No JVD, no thyromegaly. Lungs: Clear to auscultation bilaterally with normal respiratory effort. CV: Nondisplaced PMI.  Regular rate and rhythm, normal S1/S2, no S3/S4, II/VI holosystolic murmur over RUSB/LUSB. No pretibial or periankle edema.  No carotid bruit.  Normal pedal pulses.  Abdomen: Soft, nontender, no hepatosplenomegaly, no distention.  Neurologic: Alert and oriented x 3.  Psych: Normal affect. Skin: Normal. Musculoskeletal: Normal range of motion, no gross deformities. Extremities: No clubbing or cyanosis.   ECG: Most recent ECG reviewed.      ASSESSMENT AND PLAN: 1. Cardiomyopathy: There is no known history of myocardial infarction. Lexiscan Cardiolite showed no evidence  of ischemia or scar. She is on lisinopril and carvedilol, which I will increase to 6.25 mg twice daily. She will need a reassessment of cardiac function in several months.  2. 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 Her risk factors include obesity, essential hypertension and diabetes mellitus.  3. Essential HTN: Elevated today. Given her history of CVA and diabetes mellitus with relatively newly diagnosed cardiomyopathy, she will need aggressive control. I will increase Coreg to 6.25 mg bid.  4. Hyperlipidemia: Continue statin therapy. LDL 100 on 8/6.   Dispo: f/u 4 months.   Prentice Docker, M.D., F.A.C.C.

## 2014-06-01 NOTE — Patient Instructions (Signed)
Your physician recommends that you schedule a follow-up appointment in: 4 months with Dr. Purvis Sheffield  Your physician has recommended you make the following change in your medication:   INCREASE COREG TO 6.25 TWICE DAILY  Thank you for choosing [facility]!!

## 2014-06-02 ENCOUNTER — Telehealth: Payer: Self-pay | Admitting: *Deleted

## 2014-06-02 ENCOUNTER — Other Ambulatory Visit: Payer: Self-pay | Admitting: *Deleted

## 2014-06-02 ENCOUNTER — Encounter: Payer: Self-pay | Admitting: *Deleted

## 2014-06-02 DIAGNOSIS — I429 Cardiomyopathy, unspecified: Secondary | ICD-10-CM

## 2014-06-02 NOTE — Telephone Encounter (Signed)
Message copied by Vernon Prey on Thu Jun 02, 2014  4:33 PM ------      Message from: Prentice Docker A      Created: Thu Jun 02, 2014  4:08 PM       Normal. No arrhythmias. ------

## 2014-06-02 NOTE — Telephone Encounter (Signed)
Pt has no working phone #. Mailed letter, forwarded to Dr. Nelva Bush

## 2014-06-08 ENCOUNTER — Telehealth: Payer: Self-pay | Admitting: *Deleted

## 2014-06-08 NOTE — Telephone Encounter (Signed)
Pt called for results made aware and pt received letter

## 2015-01-23 ENCOUNTER — Encounter (HOSPITAL_COMMUNITY): Payer: Self-pay | Admitting: Emergency Medicine

## 2015-01-23 ENCOUNTER — Observation Stay (HOSPITAL_COMMUNITY)
Admission: EM | Admit: 2015-01-23 | Discharge: 2015-01-25 | Disposition: A | Discharge status: Home / Self Care | Payer: BLUE CROSS/BLUE SHIELD | Attending: Family Medicine | Admitting: Family Medicine

## 2015-01-23 ENCOUNTER — Emergency Department (HOSPITAL_COMMUNITY): Payer: BLUE CROSS/BLUE SHIELD

## 2015-01-23 ENCOUNTER — Observation Stay (HOSPITAL_COMMUNITY): Payer: BLUE CROSS/BLUE SHIELD

## 2015-01-23 DIAGNOSIS — K819 Cholecystitis, unspecified: Secondary | ICD-10-CM | POA: Insufficient documentation

## 2015-01-23 DIAGNOSIS — E669 Obesity, unspecified: Secondary | ICD-10-CM | POA: Diagnosis present

## 2015-01-23 DIAGNOSIS — E876 Hypokalemia: Secondary | ICD-10-CM | POA: Diagnosis present

## 2015-01-23 DIAGNOSIS — I10 Essential (primary) hypertension: Secondary | ICD-10-CM | POA: Diagnosis present

## 2015-01-23 DIAGNOSIS — Z792 Long term (current) use of antibiotics: Secondary | ICD-10-CM | POA: Insufficient documentation

## 2015-01-23 DIAGNOSIS — R531 Weakness: Secondary | ICD-10-CM | POA: Diagnosis present

## 2015-01-23 DIAGNOSIS — R011 Cardiac murmur, unspecified: Secondary | ICD-10-CM | POA: Diagnosis not present

## 2015-01-23 DIAGNOSIS — Z79899 Other long term (current) drug therapy: Secondary | ICD-10-CM | POA: Insufficient documentation

## 2015-01-23 DIAGNOSIS — R200 Anesthesia of skin: Secondary | ICD-10-CM | POA: Diagnosis present

## 2015-01-23 DIAGNOSIS — E119 Type 2 diabetes mellitus without complications: Secondary | ICD-10-CM

## 2015-01-23 DIAGNOSIS — E785 Hyperlipidemia, unspecified: Secondary | ICD-10-CM | POA: Diagnosis present

## 2015-01-23 DIAGNOSIS — Z7982 Long term (current) use of aspirin: Secondary | ICD-10-CM | POA: Insufficient documentation

## 2015-01-23 DIAGNOSIS — E66813 Obesity, class 3: Secondary | ICD-10-CM | POA: Diagnosis present

## 2015-01-23 DIAGNOSIS — Z7902 Long term (current) use of antithrombotics/antiplatelets: Secondary | ICD-10-CM | POA: Diagnosis not present

## 2015-01-23 DIAGNOSIS — G459 Transient cerebral ischemic attack, unspecified: Principal | ICD-10-CM | POA: Diagnosis present

## 2015-01-23 DIAGNOSIS — I429 Cardiomyopathy, unspecified: Secondary | ICD-10-CM

## 2015-01-23 HISTORY — DX: Obstructive sleep apnea (adult) (pediatric): G47.33

## 2015-01-23 HISTORY — DX: Obesity, unspecified: E66.9

## 2015-01-23 HISTORY — DX: Hyperlipidemia, unspecified: E78.5

## 2015-01-23 LAB — I-STAT CHEM 8, ED
BUN: 16 mg/dL (ref 6–20)
CALCIUM ION: 1.17 mmol/L (ref 1.12–1.23)
CREATININE: 1 mg/dL (ref 0.44–1.00)
Chloride: 104 mmol/L (ref 101–111)
Glucose, Bld: 218 mg/dL — ABNORMAL HIGH (ref 65–99)
HCT: 40 % (ref 36.0–46.0)
HEMOGLOBIN: 13.6 g/dL (ref 12.0–15.0)
Potassium: 2.8 mmol/L — ABNORMAL LOW (ref 3.5–5.1)
SODIUM: 143 mmol/L (ref 135–145)
TCO2: 23 mmol/L (ref 0–100)

## 2015-01-23 LAB — PROTIME-INR
INR: 1.05 (ref 0.00–1.49)
Prothrombin Time: 13.9 seconds (ref 11.6–15.2)

## 2015-01-23 LAB — CBC
HCT: 38.2 % (ref 36.0–46.0)
HEMOGLOBIN: 12.5 g/dL (ref 12.0–15.0)
MCH: 26.5 pg (ref 26.0–34.0)
MCHC: 32.7 g/dL (ref 30.0–36.0)
MCV: 80.9 fL (ref 78.0–100.0)
Platelets: 272 10*3/uL (ref 150–400)
RBC: 4.72 MIL/uL (ref 3.87–5.11)
RDW: 15.1 % (ref 11.5–15.5)
WBC: 8.6 10*3/uL (ref 4.0–10.5)

## 2015-01-23 LAB — GLUCOSE, CAPILLARY: Glucose-Capillary: 165 mg/dL — ABNORMAL HIGH (ref 65–99)

## 2015-01-23 LAB — DIFFERENTIAL
Basophils Absolute: 0.1 10*3/uL (ref 0.0–0.1)
Basophils Relative: 1 % (ref 0–1)
EOS ABS: 0.2 10*3/uL (ref 0.0–0.7)
Eosinophils Relative: 3 % (ref 0–5)
LYMPHS PCT: 44 % (ref 12–46)
Lymphs Abs: 3.8 10*3/uL (ref 0.7–4.0)
MONO ABS: 0.5 10*3/uL (ref 0.1–1.0)
Monocytes Relative: 6 % (ref 3–12)
Neutro Abs: 4 10*3/uL (ref 1.7–7.7)
Neutrophils Relative %: 46 % (ref 43–77)

## 2015-01-23 LAB — URINALYSIS, ROUTINE W REFLEX MICROSCOPIC
BILIRUBIN URINE: NEGATIVE
Glucose, UA: NEGATIVE mg/dL
Leukocytes, UA: NEGATIVE
Nitrite: NEGATIVE
Protein, ur: 100 mg/dL — AB
Specific Gravity, Urine: >1.03 — ABNORMAL HIGH (ref 1.005–1.030)
Urobilinogen, UA: 0.2 mg/dL (ref 0.0–1.0)
pH: 6 (ref 5.0–8.0)

## 2015-01-23 LAB — COMPREHENSIVE METABOLIC PANEL
ALK PHOS: 68 U/L (ref 38–126)
ALT: 19 U/L (ref 14–54)
ANION GAP: 11 (ref 5–15)
AST: 24 U/L (ref 15–41)
Albumin: 3.6 g/dL (ref 3.5–5.0)
BUN: 16 mg/dL (ref 6–20)
CO2: 26 mmol/L (ref 22–32)
Calcium: 8.8 mg/dL — ABNORMAL LOW (ref 8.9–10.3)
Chloride: 104 mmol/L (ref 101–111)
Creatinine, Ser: 0.94 mg/dL (ref 0.44–1.00)
GFR calc Af Amer: >60 mL/min (ref >60)
GLUCOSE: 215 mg/dL — AB (ref 65–99)
Potassium: 2.8 mmol/L — ABNORMAL LOW (ref 3.5–5.1)
Sodium: 141 mmol/L (ref 135–145)
Total Bilirubin: 0.3 mg/dL (ref 0.3–1.2)
Total Protein: 7.5 g/dL (ref 6.5–8.1)

## 2015-01-23 LAB — URINE MICROSCOPIC-ADD ON

## 2015-01-23 LAB — RAPID URINE DRUG SCREEN, HOSP PERFORMED
Amphetamines: NOT DETECTED
BARBITURATES: NOT DETECTED
Benzodiazepines: NOT DETECTED
COCAINE: NOT DETECTED
Opiates: NOT DETECTED
TETRAHYDROCANNABINOL: NOT DETECTED

## 2015-01-23 LAB — I-STAT TROPONIN, ED: TROPONIN I, POC: 0 ng/mL (ref 0.00–0.08)

## 2015-01-23 LAB — ETHANOL: Alcohol, Ethyl (B): <5 mg/dL (ref <5)

## 2015-01-23 LAB — APTT: aPTT: 27 seconds (ref 24–37)

## 2015-01-23 MED ORDER — POTASSIUM CHLORIDE 10 MEQ/100ML IV SOLN
10.0000 meq | Freq: Once | INTRAVENOUS | Status: AC
  Administered 2015-01-23: 10 meq via INTRAVENOUS
  Filled 2015-01-23: qty 100

## 2015-01-23 MED ORDER — HYDROCHLOROTHIAZIDE 12.5 MG PO CAPS
12.5000 mg | ORAL_CAPSULE | Freq: Every day | ORAL | Status: DC
  Administered 2015-01-24 – 2015-01-25 (×2): 12.5 mg via ORAL
  Filled 2015-01-23 (×2): qty 1

## 2015-01-23 MED ORDER — INSULIN ASPART 100 UNIT/ML ~~LOC~~ SOLN
0.0000 [IU] | Freq: Three times a day (TID) | SUBCUTANEOUS | Status: DC
  Administered 2015-01-24: 3 [IU] via SUBCUTANEOUS

## 2015-01-23 MED ORDER — LISINOPRIL-HYDROCHLOROTHIAZIDE 20-12.5 MG PO TABS: 1.0000 | ORAL_TABLET | Freq: Every day | ORAL | Status: DC

## 2015-01-23 MED ORDER — CLOPIDOGREL BISULFATE 75 MG PO TABS
75.0000 mg | ORAL_TABLET | Freq: Every day | ORAL | Status: DC
  Administered 2015-01-23 – 2015-01-25 (×3): 75 mg via ORAL
  Filled 2015-01-23 (×3): qty 1

## 2015-01-23 MED ORDER — CARVEDILOL 3.125 MG PO TABS
6.2500 mg | ORAL_TABLET | Freq: Two times a day (BID) | ORAL | Status: DC
  Administered 2015-01-23 – 2015-01-25 (×5): 6.25 mg via ORAL
  Filled 2015-01-23: qty 1
  Filled 2015-01-23 (×4): qty 2

## 2015-01-23 MED ORDER — LISINOPRIL 10 MG PO TABS
20.0000 mg | ORAL_TABLET | Freq: Every day | ORAL | Status: DC
  Administered 2015-01-24 – 2015-01-25 (×2): 20 mg via ORAL
  Filled 2015-01-23 (×2): qty 2

## 2015-01-23 MED ORDER — INSULIN ASPART 100 UNIT/ML ~~LOC~~ SOLN: 0.0000 [IU] | Freq: Every day | SUBCUTANEOUS | Status: DC

## 2015-01-23 MED ORDER — METFORMIN HCL 500 MG PO TABS
1000.0000 mg | ORAL_TABLET | Freq: Two times a day (BID) | ORAL | Status: DC
  Administered 2015-01-23 – 2015-01-25 (×5): 1000 mg via ORAL
  Filled 2015-01-23 (×5): qty 2

## 2015-01-23 MED ORDER — PRAVASTATIN SODIUM 10 MG PO TABS
20.0000 mg | ORAL_TABLET | Freq: Every day | ORAL | Status: DC
  Administered 2015-01-23 – 2015-01-25 (×3): 20 mg via ORAL
  Filled 2015-01-23 (×2): qty 1
  Filled 2015-01-23 (×3): qty 2

## 2015-01-23 MED ORDER — ONDANSETRON HCL 4 MG/2ML IJ SOLN: 4.0000 mg | Freq: Three times a day (TID) | INTRAMUSCULAR | Status: AC | PRN

## 2015-01-23 MED ORDER — HEPARIN SODIUM (PORCINE) 5000 UNIT/ML IJ SOLN
5000.0000 [IU] | Freq: Three times a day (TID) | INTRAMUSCULAR | Status: DC
  Administered 2015-01-23 – 2015-01-25 (×6): 5000 [IU] via SUBCUTANEOUS
  Filled 2015-01-23 (×6): qty 1

## 2015-01-23 MED ORDER — STROKE: EARLY STAGES OF RECOVERY BOOK
Freq: Once | Status: AC
  Administered 2015-01-24: 12:00:00
  Filled 2015-01-23: qty 1

## 2015-01-23 MED ORDER — CARVEDILOL 12.5 MG PO TABS: 6.2500 mg | ORAL_TABLET | Freq: Two times a day (BID) | ORAL | Status: DC

## 2015-01-23 MED ORDER — LISINOPRIL 10 MG PO TABS
20.0000 mg | ORAL_TABLET | ORAL | Status: AC
  Administered 2015-01-23: 20 mg via ORAL
  Filled 2015-01-23: qty 2

## 2015-01-23 MED ORDER — METFORMIN HCL 500 MG PO TABS: 1000.0000 mg | ORAL_TABLET | Freq: Two times a day (BID) | ORAL | Status: DC

## 2015-01-23 MED ORDER — ASPIRIN 325 MG PO TABS
325.0000 mg | ORAL_TABLET | Freq: Every day | ORAL | Status: DC
  Administered 2015-01-23 – 2015-01-24 (×2): 325 mg via ORAL
  Filled 2015-01-23 (×2): qty 1

## 2015-01-23 MED ORDER — POTASSIUM CHLORIDE CRYS ER 10 MEQ PO TBCR
10.0000 meq | EXTENDED_RELEASE_TABLET | Freq: Every day | ORAL | Status: DC
  Administered 2015-01-23 – 2015-01-25 (×3): 10 meq via ORAL
  Filled 2015-01-23 (×6): qty 1

## 2015-01-23 NOTE — ED Notes (Signed)
Having numbness to left hand since 0100 this am.  Sent to PCP today, was told to come to ER.

## 2015-01-23 NOTE — H&P (Signed)
[facility] History and Physical  Crystal Snow EAV:[id number] DOB: Jan 11, 1961 DOA: 01/23/2015  Referring physician: ER PCP: Lake Bells, PA-C   Chief Complaint: Left hand numbness  HPI: Crystal Snow is a 54 y.o. female  This is a 54 year old lady with 12-18 hour history of left hand numbness with weakness of the left hand.No LOC,or other limb weakness/numbness.She had a CVA in August 2015.She is hypertensive and diabetic.   Review of Systems:  Apart from above symptoms,all systems negative.  Past Medical History  Diagnosis Date  . Hypertension   . Diabetes mellitus without complication   . CVA (cerebral infarction)     03/2014  . Cholecystitis     lap chole 2006   Past Surgical History  Procedure Laterality Date  . Abdominal hysterectomy     Social History:  reports that she has never smoked. She has never used smokeless tobacco. She reports that she does not drink alcohol or use illicit drugs.  No Known Allergies   Family history-mother has had a CVA.   Prior to Admission medications   Medication Sig Start Date End Date Taking? Authorizing Provider  carvedilol (COREG) 6.25 MG tablet Take 1 tablet (6.25 mg total) by mouth 2 (two) times daily with a meal. 06/01/14  Yes Laqueta Linden, MD  clindamycin (CLEOCIN) 300 MG capsule Take 1 capsule by mouth 4 (four) times daily. 10 day course filled 01/12/2015 01/12/15  Yes Historical Provider, MD  clopidogrel (PLAVIX) 75 MG tablet Take 1 tablet (75 mg total) by mouth daily. 04/27/14  Yes Harle Battiest, NP  doxycycline (VIBRA-TABS) 100 MG tablet Take 100 mg by mouth 2 (two) times daily. 10 day course starting 01/12/2015 01/12/15  Yes Historical Provider, MD  lisinopril-hydrochlorothiazide (PRINZIDE,ZESTORETIC) 20-12.5 MG per tablet Take 1 tablet by mouth daily. 03/04/14  Yes Historical Provider, MD  lovastatin (MEVACOR) 20 MG tablet Take 2 tablets by mouth daily. 03/04/14  Yes Historical Provider, MD    metFORMIN (GLUCOPHAGE) 1000 MG tablet Take 1,000 mg by mouth 2 (two) times daily with a meal.   Yes Historical Provider, MD  mupirocin ointment (BACTROBAN) 2 % Apply 1 application topically 3 (three) times daily as needed (for inect bite).  01/13/15  Yes Historical Provider, MD  potassium chloride (K-DUR) 10 MEQ tablet Take 1 tablet (10 mEq total) by mouth daily. 03/25/14  Yes Gwenyth Bender, NP  aspirin 325 MG tablet Take 1 tablet (325 mg total) by mouth daily. Patient not taking: Reported on 01/23/2015 03/25/14   Gwenyth Bender, NP   Physical Exam: Filed Vitals:   01/23/15 1730 01/23/15 1745 01/23/15 1747 01/23/15 1800  BP:   164/103 178/96  Pulse: 85 85 85 81  Temp:      TempSrc:      Resp: Height:      Weight:      SpO2: 96% 96% 96% 95%    Wt Readings from Last 3 Encounters:  01/23/15 95.255 kg (210 lb)  06/01/14 106.595 kg (235 lb)  05/13/14 97.523 kg (215 lb)    General:  Appears calm and comfortable Eyes: PERRL, normal lids, irises & conjunctiva ENT: grossly normal hearing, lips & tongue Neck: no LAD, masses or thyromegaly Cardiovascular: RRR, no m/r/g. No LE edema. Telemetry: SR, no arrhythmias  Respiratory: CTA bilaterally, no w/r/r. Normal respiratory effort. Abdomen: soft, ntnd Skin: no rash or induration seen on limited exam Musculoskeletal: grossly normal tone BUE/BLE Psychiatric: grossly normal mood and  affect, speech fluent and appropriate Neurologic: grossly non-focal.Mild weakness left hand.          Labs on Admission:  Basic Metabolic Panel:  Recent Labs Lab 01/23/15 1520 01/23/15 1530  NA 141 143  K 2.8* 2.8*  CL 104 104  CO2 26  --   GLUCOSE 215* 218*  BUN 16 16  CREATININE 0.94 1.00  CALCIUM 8.8*  --    Liver Function Tests:  Recent Labs Lab 01/23/15 1520  AST 24  ALT 19  ALKPHOS 68  BILITOT 0.3  PROT 7.5  ALBUMIN 3.6   No results for input(s): LIPASE, AMYLASE in the last 168 hours. No results for input(s): AMMONIA in the  last 168 hours. CBC:  Recent Labs Lab 01/23/15 1520 01/23/15 1530  WBC 8.6  --   NEUTROABS 4.0  --   HGB 12.5 13.6  HCT 38.2 40.0  MCV 80.9  --   PLT 272  --    Cardiac Enzymes: No results for input(s): CKTOTAL, CKMB, CKMBINDEX, TROPONINI in the last 168 hours.  BNP (last 3 results) No results for input(s): BNP in the last 8760 hours.  ProBNP (last 3 results) No results for input(s): PROBNP in the last 8760 hours.  CBG: No results for input(s): GLUCAP in the last 168 hours.  Radiological Exams on Admission: Ct Head Wo Contrast  01/23/2015   CLINICAL DATA:  Left arm and hand tingling. History of cerebral infarction.  EXAM: CT HEAD WITHOUT CONTRAST  TECHNIQUE: Contiguous axial images were obtained from the base of the skull through the vertex without intravenous contrast.  COMPARISON:  03/24/2014  FINDINGS: Stable old right occipital infarct. The brain demonstrates no evidence of hemorrhage, acute infarction, edema, mass effect, extra-axial fluid collection, hydrocephalus or mass lesion. The skull is unremarkable.  IMPRESSION: No acute findings.  Stable right occipital infarction.   Electronically Signed   By: Irish Lack M.D.   On: 01/23/2015 16:46   Mr Brain Wo Contrast  01/23/2015   CLINICAL DATA:  Weakness and left arm numbness, 1 day duration.  EXAM: MRI HEAD WITHOUT CONTRAST  TECHNIQUE: Multiplanar, multiecho pulse sequences of the brain and surrounding structures were obtained without intravenous contrast.  COMPARISON:  Head CT same day.  MRI 03/24/2014.  FINDINGS: Diffusion imaging does not show any acute or subacute infarction. The brainstem and cerebellum are normal. There is old infarction in the right occipital lobe with atrophy and gliosis. There are mild to moderate chronic small-vessel ischemic changes in the hemispheric deep white matter bilaterally. No mass lesion, acute hemorrhage, hydrocephalus or extra-axial collection. No pituitary mass. No fluid in the sinuses,  middle ears or mastoids.  IMPRESSION: No acute or reversible finding. Old infarction in the right occipital lobe. Mild to moderate chronic small-vessel changes of the cerebral hemispheric white matter.   Electronically Signed   By: Paulina Fusi M.D.   On: 01/23/2015 18:43    EKG: Independently reviewed. SR,no acute changes  Assessment/Plan Active Problems:   TIA (transient ischemic attack)   Diabetes   Essential hypertension   Obesity   1. TIA.MRI does not show CVA.Will ask Neurology to see patient.Patient has been non-compliant with her medications. 2. HTN.Continue with home medications. 3. DM.Continue with home medications and SSI.     Code Status: FULL   DVT Prophylaxis:Heparin  Family Communication: I discussed plan with patient.  Disposition Plan: Home when medically stable .  Time spent: 45 mins  [facility] Pager [phone]

## 2015-01-23 NOTE — ED Provider Notes (Signed)
CSN: [id number]     Arrival date & time 01/23/15  1439 History   First MD Initiated Contact with Patient 01/23/15 1454     Chief Complaint  Patient presents with  . Numbness     (Consider location/radiation/quality/duration/timing/severity/associated sxs/prior Treatment) HPI Comments: The patient is a 54 year old female, she has a history of hypertension, diabetes, stroke dating to August 2015. This was confirmed on MRI when she was admitted to the hospital. She presents today after having 2 distinct episodes of left hand numbness and heaviness, the first occurring at 1:00 AM when she was at home walking her dog, this spontaneously resolved and when she woke up this morning she was back to normal. 1-1/2 hours ago this recurred with exactly the same symptoms of numbness and heaviness of the left hand while she had her mother at the doctor's office. She was told it come to the hospital because of concern for stroke. She reports that she has not had her home medications today because she has not been at home, this includes Plavix and her blood pressure medications. She does not take aspirin. She reports that since the onset of her left hand numbness and heaviness approximately one hour ago it has totally resolved again. She denies any associated headache, weakness, trouble with gait, speech, vision, swallowing or speaking. She denies any chest pain shortness of breath or palpitations.  The history is provided by the patient.    Past Medical History  Diagnosis Date  . Hypertension   . Diabetes mellitus without complication   . CVA (cerebral infarction)     03/2014  . Cholecystitis     lap chole 2006   Past Surgical History  Procedure Laterality Date  . Abdominal hysterectomy     History reviewed. No pertinent family history. History  Substance Use Topics  . Smoking status: Never Smoker   . Smokeless tobacco: Never Used  . Alcohol Use: No   OB History    No data available     Review  of Systems  All other systems reviewed and are negative.     Allergies  Review of patient's allergies indicates no known allergies.  Home Medications   Prior to Admission medications   Medication Sig Start Date End Date Taking? Authorizing Provider  carvedilol (COREG) 6.25 MG tablet Take 1 tablet (6.25 mg total) by mouth 2 (two) times daily with a meal. 06/01/14  Yes Laqueta Linden, MD  clindamycin (CLEOCIN) 300 MG capsule Take 1 capsule by mouth 4 (four) times daily. 10 day course filled 01/12/2015 01/12/15  Yes Historical Provider, MD  clopidogrel (PLAVIX) 75 MG tablet Take 1 tablet (75 mg total) by mouth daily. 04/27/14  Yes Harle Battiest, NP  doxycycline (VIBRA-TABS) 100 MG tablet Take 100 mg by mouth 2 (two) times daily. 10 day course starting 01/12/2015 01/12/15  Yes Historical Provider, MD  lisinopril-hydrochlorothiazide (PRINZIDE,ZESTORETIC) 20-12.5 MG per tablet Take 1 tablet by mouth daily. 03/04/14  Yes Historical Provider, MD  lovastatin (MEVACOR) 20 MG tablet Take 2 tablets by mouth daily. 03/04/14  Yes Historical Provider, MD  metFORMIN (GLUCOPHAGE) 1000 MG tablet Take 1,000 mg by mouth 2 (two) times daily with a meal.   Yes Historical Provider, MD  mupirocin ointment (BACTROBAN) 2 % Apply 1 application topically 3 (three) times daily as needed (for inect bite).  01/13/15  Yes Historical Provider, MD  potassium chloride (K-DUR) 10 MEQ tablet Take 1 tablet (10 mEq total) by mouth daily. 03/25/14  Yes Lesle Chris  Black, NP  aspirin 325 MG tablet Take 1 tablet (325 mg total) by mouth daily. Patient not taking: Reported on 01/23/2015 03/25/14   Lesle Chris Black, NP   BP 164/103 mmHg  Pulse 85  Temp(Src) 98.7 F (37.1 C) (Oral)  Resp 24  Ht 5\' 2"  (1.575 m)  Wt 210 lb (95.255 kg)  BMI 38.40 kg/m2  SpO2 96% Physical Exam  Constitutional: She appears well-developed and well-nourished. No distress.  HENT:  Head: Normocephalic and atraumatic.  Mouth/Throat: Oropharynx is clear and  moist. No oropharyngeal exudate.  Eyes: Conjunctivae and EOM are normal. Pupils are equal, round, and reactive to light. Right eye exhibits no discharge. Left eye exhibits no discharge. No scleral icterus.  Neck: Normal range of motion. Neck supple. No JVD present. No thyromegaly present.  Cardiovascular: Normal rate, regular rhythm and intact distal pulses.  Exam reveals no gallop and no friction rub.   Murmur ( soft) heard. No carotid bruits  Pulmonary/Chest: Effort normal and breath sounds normal. No respiratory distress. She has no wheezes. She has no rales.  Abdominal: Soft. Bowel sounds are normal. She exhibits no distension and no mass. There is no tenderness.  Musculoskeletal: Normal range of motion. She exhibits no edema or tenderness.  Lymphadenopathy:    She has no cervical adenopathy.  Neurological: She is alert. Coordination normal.  Neurologic exam:  Speech clear, pupils equal round reactive to light, extraocular movements intact  Normal peripheral visual fields Cranial nerves III through XII normal including no facial droop Follows commands, moves all extremities x4, normal strength to bilateral upper and lower extremities at all major muscle groups including grip Sensation normal to light touch and pinprick Coordination intact, no limb ataxia, finger-nose-finger normal Rapid alternating movements normal No pronator drift    Skin: Skin is warm and dry. No rash noted. No erythema.  Psychiatric: She has a normal mood and affect. Her behavior is normal.  Nursing note and vitals reviewed.   ED Course  Procedures (including critical care time) Labs Review Labs Reviewed  COMPREHENSIVE METABOLIC PANEL - Abnormal; Notable for the following:    Potassium 2.8 (*)    Glucose, Bld 215 (*)    Calcium 8.8 (*)    All other components within normal limits  I-STAT CHEM 8, ED - Abnormal; Notable for the following:    Potassium 2.8 (*)    Glucose, Bld 218 (*)    All other  components within normal limits  ETHANOL  PROTIME-INR  APTT  CBC  DIFFERENTIAL  URINE RAPID DRUG SCREEN (HOSP PERFORMED) NOT AT [facility]  URINALYSIS, ROUTINE W REFLEX MICROSCOPIC (NOT AT [facility])  I-STAT TROPOININ, ED    Imaging Review Ct Head Wo Contrast  01/23/2015   CLINICAL DATA:  Left arm and hand tingling. History of cerebral infarction.  EXAM: CT HEAD WITHOUT CONTRAST  TECHNIQUE: Contiguous axial images were obtained from the base of the skull through the vertex without intravenous contrast.  COMPARISON:  03/24/2014  FINDINGS: Stable old right occipital infarct. The brain demonstrates no evidence of hemorrhage, acute infarction, edema, mass effect, extra-axial fluid collection, hydrocephalus or mass lesion. The skull is unremarkable.  IMPRESSION: No acute findings.  Stable right occipital infarction.   Electronically Signed   By: Irish Lack M.D.   On: 01/23/2015 16:46     EKG Interpretation   Date/Time:  Monday January 23 2015 15:04:35 EDT Ventricular Rate:  87 PR Interval:  203 QRS Duration: 102 QT Interval:  390 QTC Calculation: 469 R  Axis:   31 Text Interpretation:  Sinus rhythm Borderline prolonged PR interval  Posterior infarct, old Baseline wander in lead(s) V1 Abnormal ekg Since  last tracing LVH no longer seen Confirmed by Rejoice Heatwole  MD, Talor Cheema ([id number]) on  01/23/2015 3:24:01 PM      MDM   Final diagnoses:  Transient cerebral ischemia, unspecified transient cerebral ischemia type    R/o stroke, - has severe Htn, she will be given her home meds, stroke w/u.  Not a thrombolytic candidate as she has severe htn and no active neuro sx.  D/w Dr. Karilyn Cota who will admit  CT neg, labs neg, BP improved after meds.  Eber Hong, MD 01/23/15 [phone]

## 2015-01-24 ENCOUNTER — Observation Stay (HOSPITAL_COMMUNITY): Payer: BLUE CROSS/BLUE SHIELD

## 2015-01-24 ENCOUNTER — Observation Stay (HOSPITAL_BASED_OUTPATIENT_CLINIC_OR_DEPARTMENT_OTHER): Payer: BLUE CROSS/BLUE SHIELD

## 2015-01-24 DIAGNOSIS — G459 Transient cerebral ischemic attack, unspecified: Secondary | ICD-10-CM

## 2015-01-24 LAB — GLUCOSE, CAPILLARY
Glucose-Capillary: 144 mg/dL — ABNORMAL HIGH (ref 65–99)
Glucose-Capillary: 116 mg/dL — ABNORMAL HIGH (ref 65–99)
Glucose-Capillary: 113 mg/dL — ABNORMAL HIGH (ref 65–99)
Glucose-Capillary: 172 mg/dL — ABNORMAL HIGH (ref 65–99)

## 2015-01-24 LAB — HEMOGLOBIN A1C
Hgb A1c MFr Bld: 7.6 % — ABNORMAL HIGH (ref 4.8–5.6)
MEAN PLASMA GLUCOSE: 171 mg/dL

## 2015-01-24 LAB — LIPID PANEL
Cholesterol: 190 mg/dL (ref 0–200)
HDL: 56 mg/dL (ref >40)
LDL Cholesterol: 105 mg/dL — ABNORMAL HIGH (ref 0–99)
Total CHOL/HDL Ratio: 3.4 RATIO
Triglycerides: 145 mg/dL (ref <150)
VLDL: 29 mg/dL (ref 0–40)

## 2015-01-24 LAB — RAPID URINE DRUG SCREEN, HOSP PERFORMED
Amphetamines: NOT DETECTED
BARBITURATES: NOT DETECTED
Benzodiazepines: NOT DETECTED
Cocaine: NOT DETECTED
OPIATES: NOT DETECTED
Tetrahydrocannabinol: NOT DETECTED

## 2015-01-24 LAB — TSH: TSH: 0.892 u[IU]/mL (ref 0.350–4.500)

## 2015-01-24 LAB — MAGNESIUM: Magnesium: 1.8 mg/dL (ref 1.7–2.4)

## 2015-01-24 MED ORDER — POTASSIUM CHLORIDE CRYS ER 20 MEQ PO TBCR
40.0000 meq | EXTENDED_RELEASE_TABLET | ORAL | Status: AC
  Administered 2015-01-24 (×2): 40 meq via ORAL
  Filled 2015-01-24 (×2): qty 2

## 2015-01-24 NOTE — Consult Note (Addendum)
[place] A. Crystal Laughter, MD     [email or web address]          Crystal Snow Crystal Snow is an 54 y.o. female.   ASSESSMENT/PLAN: 1. Recurrent episodes of neuropathic symptoms involving the left upper extremity and left facial region. 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 2. 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 Risk factors obesity, hypertension, diabetes, dyslipidemia and obstructive sleep apnea syndrome.  RECOMMENDATION: EEG. We may consider treating the patient empirically with seizure medication for these episodic events. She does not need a long-term dual antiplatelet agents. A single agent is sufficient either aspirin or Plavix.  The patient should be setup for CPAP. This will be arranged when she returns the office. And additional labs for the following: RPR, homocysteine level, thyroid function test and HIV. Other risk factor modification includes lipid lowering agents particular statins, blood pressure control, diabetes controlled and weight loss.  The patient is a 54 year old black female who had a stroke last year presenting with left upper extremity and facial numbness and weakness. The patient had an extensive workup at that time including MRA/MRI echocardiography and carotid duplex Doppler. The echo showed a low ejection fraction. Given that and it given the PCA distribution stroke, we were suspicious that the patient may have had embolic stroke. She did have a 30 day event monitor which was unrevealing however. The patient has had a recurrent episodes of left upper extremity numbness, tingling and weakness lasting for about 5-10 minutes. We asked he saw the patient a couple months after her initial stroke and repeat imaging was unrevealing. She reports that last couple days she's had multiple of these events. She does not report any  symptoms involving her left upper and lower extremities. The right side is unremarkable. She also has some symptoms involving the left facial region in a periorbital distribution. She reports taking her medication although she actually admits to missing days at that time. She was diagnosed with a moderate to severe obstructive sleep apnea syndrome but did not return to the office for treatment. The review of systems otherwise negative. There complaints of no evidence of headaches, dyspnea, shortness of breath, GI or GU symptoms.  GENERAL: Pleasant obese female in no acute distress.  HEENT: Supple. Atraumatic normocephalic.   ABDOMEN: soft  EXTREMITIES: No edema   BACK: Normal.  SKIN: Normal by inspection.    MENTAL STATUS: Alert and oriented. Speech, language and cognition are generally intact. Judgment and insight normal.  She states her age and the month appropriately.  CRANIAL NERVES: Pupils are equal, round and reactive to light and accommodation; extra ocular movements are full, there is no significant nystagmus; visual fields are full; upper and lower facial muscles are normal in strength and symmetric, there is no flattening of the nasolabial folds; tongue is midline; uvula is midline; shoulder elevation is normal.  MOTOR: Normal tone, bulk and strength; mild drift involving the left upper extremity and left lower extremity. Right side is normal.  COORDINATION: Left finger to nose is normal, right finger to nose is normal, No rest tremor; no intention tremor; no postural tremor; no bradykinesia.  REFLEXES: Deep tendon reflexes are symmetrical and normal. Babinski reflexes are flexor bilaterally.   SENSATION: There is reduced sensation to light touch involving the left upper extremity. There is extinction to double simultaneous simulation involving the left upper extremity and left lower extremity. No visual  extinction.  NIH stroke scale 5.  The brain MRI is reviewed in person.  There is encephalomalacia involving the right occipital temporal region associated with increased signal on FLAIR imaging. Nothing acute is seen on diffusion imaging. No acute hemorrhages are seen. There is no evidence of broken down blood products on echo gradient imaging.   Blood pressure 184/98, pulse 77, temperature 98.8 F (37.1 C), temperature source Oral, resp. rate 18, height 5' 2" (1.575 m), weight 108.274 kg (238 lb 11.2 oz), SpO2 100 %.  Past Medical History  Diagnosis Date  . Hypertension   . Diabetes mellitus without complication   . CVA (cerebral infarction)     03/2014  . Cholecystitis     lap chole 2006    Past Surgical History  Procedure Laterality Date  . Abdominal hysterectomy      History reviewed. No pertinent family history.  Social History:  reports that she has never smoked. She has never used smokeless tobacco. She reports that she does not drink alcohol or use illicit drugs.  Allergies: No Known Allergies  Medications: Prior to Admission medications   Medication Sig Start Date End Date Taking? Authorizing Provider  carvedilol (COREG) 6.25 MG tablet Take 1 tablet (6.25 mg total) by mouth 2 (two) times daily with a meal. 06/01/14  Yes Herminio Commons, MD  clindamycin (CLEOCIN) 300 MG capsule Take 1 capsule by mouth 4 (four) times daily. 10 day course filled 01/12/2015 01/12/15  Yes Historical Provider, MD  clopidogrel (PLAVIX) 75 MG tablet Take 1 tablet (75 mg total) by mouth daily. 04/27/14  Yes Britt Bottom, NP  doxycycline (VIBRA-TABS) 100 MG tablet Take 100 mg by mouth 2 (two) times daily. 10 day course starting 01/12/2015 01/12/15  Yes Historical Provider, MD  lisinopril-hydrochlorothiazide (PRINZIDE,ZESTORETIC) 20-12.5 MG per tablet Take 1 tablet by mouth daily. 03/04/14  Yes Historical Provider, MD  lovastatin (MEVACOR) 20 MG tablet Take 2 tablets by mouth daily. 03/04/14  Yes Historical Provider, MD  metFORMIN (GLUCOPHAGE) 1000 MG tablet Take 1,000  mg by mouth 2 (two) times daily with a meal.   Yes Historical Provider, MD  mupirocin ointment (BACTROBAN) 2 % Apply 1 application topically 3 (three) times daily as needed (for inect bite).  01/13/15  Yes Historical Provider, MD  potassium chloride (K-DUR) 10 MEQ tablet Take 1 tablet (10 mEq total) by mouth daily. 03/25/14  Yes Radene Gunning, NP  aspirin 325 MG tablet Take 1 tablet (325 mg total) by mouth daily. Patient not taking: Reported on 01/23/2015 03/25/14   Radene Gunning, NP    Scheduled Meds: . aspirin  325 mg Oral Daily  . carvedilol  6.25 mg Oral BID WC  . clopidogrel  75 mg Oral Daily  . heparin  5,000 Units Subcutaneous 3 times per day  . lisinopril  20 mg Oral Daily   And  . hydrochlorothiazide  12.5 mg Oral Daily  . insulin aspart  0-20 Units Subcutaneous TID WC  . insulin aspart  0-5 Units Subcutaneous QHS  . metFORMIN  1,000 mg Oral BID WC  . potassium chloride  10 mEq Oral Daily  . pravastatin  20 mg Oral q1800   Continuous Infusions:  PRN Meds:.     Results for orders placed or performed during the hospital encounter of 01/23/15 (from the past 48 hour(s))  Ethanol     Status: None   Collection Time: 01/23/15  3:20 PM  Result Value Ref Range   Alcohol, Ethyl (B) <5 <  5 mg/dL    Comment:        LOWEST DETECTABLE LIMIT FOR SERUM ALCOHOL IS 11 mg/dL FOR MEDICAL PURPOSES ONLY   Protime-INR     Status: None   Collection Time: 01/23/15  3:20 PM  Result Value Ref Range   Prothrombin Time 13.9 11.6 - 15.2 seconds   INR 1.05 0.00 - 1.49  APTT     Status: None   Collection Time: 01/23/15  3:20 PM  Result Value Ref Range   aPTT 27 24 - 37 seconds  CBC     Status: None   Collection Time: 01/23/15  3:20 PM  Result Value Ref Range   WBC 8.6 4.0 - 10.5 K/uL   RBC 4.72 3.87 - 5.11 MIL/uL   Hemoglobin 12.5 12.0 - 15.0 g/dL   HCT 38.2 36.0 - 46.0 %   MCV 80.9 78.0 - 100.0 fL   MCH 26.5 26.0 - 34.0 pg   MCHC 32.7 30.0 - 36.0 g/dL   RDW 15.1 11.5 - 15.5 %   Platelets  272 150 - 400 K/uL  Differential     Status: None   Collection Time: 01/23/15  3:20 PM  Result Value Ref Range   Neutrophils Relative % 46 43 - 77 %   Neutro Abs 4.0 1.7 - 7.7 K/uL   Lymphocytes Relative 44 12 - 46 %   Lymphs Abs 3.8 0.7 - 4.0 K/uL   Monocytes Relative 6 3 - 12 %   Monocytes Absolute 0.5 0.1 - 1.0 K/uL   Eosinophils Relative 3 0 - 5 %   Eosinophils Absolute 0.2 0.0 - 0.7 K/uL   Basophils Relative 1 0 - 1 %   Basophils Absolute 0.1 0.0 - 0.1 K/uL  Comprehensive metabolic panel     Status: Abnormal   Collection Time: 01/23/15  3:20 PM  Result Value Ref Range   Sodium 141 135 - 145 mmol/L   Potassium 2.8 (L) 3.5 - 5.1 mmol/L   Chloride 104 101 - 111 mmol/L   CO2 26 22 - 32 mmol/L   Glucose, Bld 215 (H) 65 - 99 mg/dL   BUN 16 6 - 20 mg/dL   Creatinine, Ser 0.94 0.44 - 1.00 mg/dL   Calcium 8.8 (L) 8.9 - 10.3 mg/dL   Total Protein 7.5 6.5 - 8.1 g/dL   Albumin 3.6 3.5 - 5.0 g/dL   AST 24 15 - 41 U/L   ALT 19 14 - 54 U/L   Alkaline Phosphatase 68 38 - 126 U/L   Total Bilirubin 0.3 0.3 - 1.2 mg/dL   GFR calc non Af Amer >60 >60 mL/min   GFR calc Af Amer >60 >60 mL/min    Comment: (NOTE) The eGFR has been calculated using the CKD EPI equation. This calculation has not been validated in all clinical situations. eGFR's persistently <60 mL/min signify possible Chronic Kidney Disease.    Anion gap 11 5 - 15  Hemoglobin A1c     Status: Abnormal   Collection Time: 01/23/15  3:20 PM  Result Value Ref Range   Hgb A1c MFr Bld 7.6 (H) 4.8 - 5.6 %    Comment: (NOTE)         Pre-diabetes: 5.7 - 6.4         Diabetes: >6.4         Glycemic control for adults with diabetes: <7.0    Mean Plasma Glucose 171 mg/dL    Comment: (NOTE) Performed At: [facility] [address]  [address] [place], [place] [id number] Lindon Romp MD QQ:[id number]   I-stat troponin, ED (not at [facility], [facility])     Status: None   Collection Time: 01/23/15  3:28 PM  Result Value Ref Range   Troponin  i, poc 0.00 0.00 - 0.08 ng/mL   Comment 3            Comment: Due to the release kinetics of cTnI, a negative result within the first hours of the onset of symptoms does not rule out myocardial infarction with certainty. If myocardial infarction is still suspected, repeat the test at appropriate intervals.   I-Stat Chem 8, ED  (not at [facility], [facility])     Status: Abnormal   Collection Time: 01/23/15  3:30 PM  Result Value Ref Range   Sodium 143 135 - 145 mmol/L   Potassium 2.8 (L) 3.5 - 5.1 mmol/L   Chloride 104 101 - 111 mmol/L   BUN 16 6 - 20 mg/dL   Creatinine, Ser 1.00 0.44 - 1.00 mg/dL   Glucose, Bld 218 (H) 65 - 99 mg/dL   Calcium, Ion 1.17 1.12 - 1.23 mmol/L   TCO2 23 0 - 100 mmol/L   Hemoglobin 13.6 12.0 - 15.0 g/dL   HCT 40.0 36.0 - 46.0 %  Urine rapid drug screen (hosp performed)not at [facility]     Status: None   Collection Time: 01/23/15  5:22 PM  Result Value Ref Range   Opiates NONE DETECTED NONE DETECTED   Cocaine NONE DETECTED NONE DETECTED   Benzodiazepines NONE DETECTED NONE DETECTED   Amphetamines NONE DETECTED NONE DETECTED   Tetrahydrocannabinol NONE DETECTED NONE DETECTED   Barbiturates NONE DETECTED NONE DETECTED    Comment:        DRUG SCREEN FOR MEDICAL PURPOSES ONLY.  IF CONFIRMATION IS NEEDED FOR ANY PURPOSE, NOTIFY LAB WITHIN 5 DAYS.        LOWEST DETECTABLE LIMITS FOR URINE DRUG SCREEN Drug Class       Cutoff (ng/mL) Amphetamine      1000 Barbiturate      200 Benzodiazepine   712 Tricyclics       458 Opiates          300 Cocaine          300 THC              50   Urinalysis, Routine w reflex microscopic (not at [facility])     Status: Abnormal   Collection Time: 01/23/15  5:22 PM  Result Value Ref Range   Color, Urine YELLOW YELLOW   APPearance CLEAR CLEAR   Specific Gravity, Urine >1.030 (H) 1.005 - 1.030   pH 6.0 5.0 - 8.0   Glucose, UA NEGATIVE NEGATIVE mg/dL   Hgb urine dipstick TRACE (A) NEGATIVE   Bilirubin Urine NEGATIVE NEGATIVE   Ketones,  ur TRACE (A) NEGATIVE mg/dL   Protein, ur 100 (A) NEGATIVE mg/dL   Urobilinogen, UA 0.2 0.0 - 1.0 mg/dL   Nitrite NEGATIVE NEGATIVE   Leukocytes, UA NEGATIVE NEGATIVE  Urine microscopic-add on     Status: Abnormal   Collection Time: 01/23/15  5:22 PM  Result Value Ref Range   Squamous Epithelial / LPF FEW (A) RARE   WBC, UA 0-2 <3 WBC/hpf   RBC / HPF 0-2 <3 RBC/hpf   Bacteria, UA FEW (A) RARE   Crystals CA OXALATE CRYSTALS (A) NEGATIVE  Glucose, capillary     Status: Abnormal   Collection Time: 01/23/15  9:36 PM  Result Value Ref Range   Glucose-Capillary 165 (H) 65 - 99 mg/dL   Comment 1 Notify RN    Comment 2 Document in Chart   Urine rapid drug screen (hosp performed)not at [facility]     Status: None   Collection Time: 01/24/15 12:20 AM  Result Value Ref Range   Opiates NONE DETECTED NONE DETECTED   Cocaine NONE DETECTED NONE DETECTED   Benzodiazepines NONE DETECTED NONE DETECTED   Amphetamines NONE DETECTED NONE DETECTED   Tetrahydrocannabinol NONE DETECTED NONE DETECTED   Barbiturates NONE DETECTED NONE DETECTED    Comment:        DRUG SCREEN FOR MEDICAL PURPOSES ONLY.  IF CONFIRMATION IS NEEDED FOR ANY PURPOSE, NOTIFY LAB WITHIN 5 DAYS.        LOWEST DETECTABLE LIMITS FOR URINE DRUG SCREEN Drug Class       Cutoff (ng/mL) Amphetamine      1000 Barbiturate      200 Benzodiazepine   426 Tricyclics       834 Opiates          300 Cocaine          300 THC              50   Fasting lipid panel     Status: Abnormal   Collection Time: 01/24/15  5:23 AM  Result Value Ref Range   Cholesterol 190 0 - 200 mg/dL   Triglycerides 145 <150 mg/dL   HDL 56 >40 mg/dL   Total CHOL/HDL Ratio 3.4 RATIO   VLDL 29 0 - 40 mg/dL   LDL Cholesterol 105 (H) 0 - 99 mg/dL    Comment:        Total Cholesterol/HDL:CHD Risk Coronary Heart Disease Risk Table                     Men   Women  1/2 Average Risk   3.4   3.3  Average Risk       5.0   4.4  2 X Average Risk   9.6   7.1  3 X Average  Risk  23.4   11.0        Use the calculated Patient Ratio above and the CHD Risk Table to determine the patient's CHD Risk.        ATP III CLASSIFICATION (LDL):  <100     mg/dL   Optimal  100-129  mg/dL   Near or Above                    Optimal  130-159  mg/dL   Borderline  160-189  mg/dL   High  >190     mg/dL   Very High   Magnesium     Status: None   Collection Time: 01/24/15  5:23 AM  Result Value Ref Range   Magnesium 1.8 1.7 - 2.4 mg/dL  Glucose, capillary     Status: Abnormal   Collection Time: 01/24/15  7:31 AM  Result Value Ref Range   Glucose-Capillary 144 (H) 65 - 99 mg/dL   Comment 1 Notify RN   Glucose, capillary     Status: Abnormal   Collection Time: 01/24/15 11:22 AM  Result Value Ref Range   Glucose-Capillary 116 (H) 65 - 99 mg/dL   Comment 1 Notify RN   Glucose, capillary     Status: Abnormal   Collection Time: 01/24/15  4:40 PM  Result Value Ref Range   Glucose-Capillary  113 (H) 65 - 99 mg/dL   Comment 1 Notify RN     Studies/Results:  BRAIN MRI No acute or reversible finding. Old infarction in the right occipital lobe. Mild to moderate chronic small-vessel changes of the cerebral hemispheric white matter.   Rayshad Riviello A. Crystal Snow, M.D.  Diplomate, Tax adviser of Psychiatry and Neurology ( Neurology). 01/24/2015, 6:19 PM

## 2015-01-24 NOTE — Care Management Note (Signed)
Case Management Note  Patient Details  Name: Crystal Snow MRN: [id number] Date of Birth: [birth year]  Subjective/Objective:                  Pt admitted from home with TIA. Pt lives with family and will return home at discharge. Pt is independent with ADl's.  Action/Plan: Pt given number to [facility] as pt did stated that some times she is unable to get to MD appts. No other CM needs noted.  Expected Discharge Date:                  Expected Discharge Plan:  Home/Self Care  In-House Referral:  NA  Discharge planning Services  CM Consult  Post Acute Care Choice:  NA Choice offered to:  NA  DME Arranged:    DME Agency:     HH Arranged:    HH Agency:     Status of Service:  Completed, signed off  Medicare Important Message Given:    Date Medicare IM Given:    Medicare IM give by:    Date Additional Medicare IM Given:    Additional Medicare Important Message give by:     If discussed at Long Length of Stay Meetings, dates discussed:    Additional Comments:  Cheryl Flash, RN 01/24/2015, 11:52 AM

## 2015-01-24 NOTE — Progress Notes (Signed)
[facility] PROGRESS NOTE  Crystal Snow NWG:[id number] DOB: [birth year] DOA: 01/23/2015 PCP: Crystal Bells, PA-C  Assessment/Plan: 1. TIA. MRI does not show CVA. Patient not always compliant with medications. Risk factors included uncontrolled BP, diabetes, obesity. await Neurology input. Continue asa and statin. FLP with LDL 105. A1c 7.6. Await echo and carotid doppler.  Left hand remains "tingly". Encouraged weight loss, compliance with meds and DM control. Continue plavix. Await PT evaluation 2. HTN. Poor control due to non-compliance with mediation. Continue with home medications. Home medications include coreg, lisinopril hctz. Will likely need additional. Consider norvasc 3. DM.Continue with home medications and SSI. See above. CBG range 116-144. On oral agents at home.   Code Status: full Family Communication: none present Disposition Plan: home hopefully   Consultants:  neurology  Procedures:    Antibiotics:  none  HPI/Subjective: Sitting up in bed watching TV. Denies pain/discomfort but reports right hand remains 'tingly  Objective: Filed Vitals:   01/24/15 1400  BP: 168/93  Pulse: 77  Temp: 98.7 F (37.1 C)  Resp: 18    Intake/Output Summary (Last 24 hours) at 01/24/15 1610 Last data filed at 01/24/15 1300  Gross per 24 hour  Intake    720 ml  Output    150 ml  Net    570 ml   Filed Weights   01/23/15 1444 01/23/15 1934  Weight: 95.255 kg (210 lb) 108.274 kg (238 lb 11.2 oz)    Exam:   General:  Obese appears well  Cardiovascular: RRR +murmur no LE edeam  Respiratory: normal effort BS clear  Abdomen: obese soft +BS  Musculoskeletal: no clubbing or cyanosis   Data Reviewed: Basic Metabolic Panel:  Recent Labs Lab 01/23/15 1520 01/23/15 1530 01/24/15 0523  NA 141 143  --   K 2.8* 2.8*  --   CL 104 104  --   CO2 26  --   --   GLUCOSE 215* 218*  --   BUN 16 16  --   CREATININE 0.94 1.00  --   CALCIUM 8.8*  --   --    MG  --   --  1.8   Liver Function Tests:  Recent Labs Lab 01/23/15 1520  AST 24  ALT 19  ALKPHOS 68  BILITOT 0.3  PROT 7.5  ALBUMIN 3.6   No results for input(s): LIPASE, AMYLASE in the last 168 hours. No results for input(s): AMMONIA in the last 168 hours. CBC:  Recent Labs Lab 01/23/15 1520 01/23/15 1530  WBC 8.6  --   NEUTROABS 4.0  --   HGB 12.5 13.6  HCT 38.2 40.0  MCV 80.9  --   PLT 272  --    Cardiac Enzymes: No results for input(s): CKTOTAL, CKMB, CKMBINDEX, TROPONINI in the last 168 hours. BNP (last 3 results) No results for input(s): BNP in the last 8760 hours.  ProBNP (last 3 results) No results for input(s): PROBNP in the last 8760 hours.  CBG:  Recent Labs Lab 01/23/15 2136 01/24/15 0731 01/24/15 1122  GLUCAP 165* 144* 116*    No results found for this or any previous visit (from the past 240 hour(s)).   Studies: Ct Head Wo Contrast  01/23/2015   CLINICAL DATA:  Left arm and hand tingling. History of cerebral infarction.  EXAM: CT HEAD WITHOUT CONTRAST  TECHNIQUE: Contiguous axial images were obtained from the base of the skull through the vertex without intravenous contrast.  COMPARISON:  03/24/2014  FINDINGS: Stable old right  occipital infarct. The brain demonstrates no evidence of hemorrhage, acute infarction, edema, mass effect, extra-axial fluid collection, hydrocephalus or mass lesion. The skull is unremarkable.  IMPRESSION: No acute findings.  Stable right occipital infarction.   Electronically Signed   By: Irish Lack M.D.   On: 01/23/2015 16:46   Mr Brain Wo Contrast  01/23/2015   CLINICAL DATA:  Weakness and left arm numbness, 1 day duration.  EXAM: MRI HEAD WITHOUT CONTRAST  TECHNIQUE: Multiplanar, multiecho pulse sequences of the brain and surrounding structures were obtained without intravenous contrast.  COMPARISON:  Head CT same day.  MRI 03/24/2014.  FINDINGS: Diffusion imaging does not show any acute or subacute infarction. The  brainstem and cerebellum are normal. There is old infarction in the right occipital lobe with atrophy and gliosis. There are mild to moderate chronic small-vessel ischemic changes in the hemispheric deep white matter bilaterally. No mass lesion, acute hemorrhage, hydrocephalus or extra-axial collection. No pituitary mass. No fluid in the sinuses, middle ears or mastoids.  IMPRESSION: No acute or reversible finding. Old infarction in the right occipital lobe. Mild to moderate chronic small-vessel changes of the cerebral hemispheric white matter.   Electronically Signed   By: Paulina Fusi M.D.   On: 01/23/2015 18:43    Scheduled Meds: . aspirin  325 mg Oral Daily  . carvedilol  6.25 mg Oral BID WC  . clopidogrel  75 mg Oral Daily  . heparin  5,000 Units Subcutaneous 3 times per day  . lisinopril  20 mg Oral Daily   And  . hydrochlorothiazide  12.5 mg Oral Daily  . insulin aspart  0-20 Units Subcutaneous TID WC  . insulin aspart  0-5 Units Subcutaneous QHS  . metFORMIN  1,000 mg Oral BID WC  . potassium chloride  10 mEq Oral Daily  . potassium chloride  40 mEq Oral Q4H  . pravastatin  20 mg Oral q1800   Continuous Infusions:   Principal Problem:   TIA (transient ischemic attack) Active Problems:   Diabetes   Essential hypertension   Hypokalemia   Obesity   Cardiomyopathy    Time spent: 35 mintues    [facility]  [facility] Pager [phone]. If 7PM-7AM, please contact night-coverage at [email or web address], password [facility] 01/24/2015, 4:10 PM

## 2015-01-24 NOTE — Discharge Summary (Signed)
Physician Discharge Summary  Crystal Snow ERD:[id number] DOB: October 01, 1960 DOA: 01/23/2015  PCP: Lake Bells, PA-C  Admit date: 01/23/2015 Discharge date: 01/25/2015  Time spent: 40 minutes  Recommendations for Outpatient Follow-up:  1. PCP 02/10/15 weeks to evaluate BP control and release to work if appropriate 2. Home health PT/OT.  Discharge Diagnoses:  Principal Problem:   TIA (transient ischemic attack) Active Problems:   Diabetes   Dyslipidemia   Essential hypertension   Hypokalemia   Obesity   Cardiomyopathy   Left-sided weakness   Discharge Condition: stable  Diet recommendation: heart healthy carb modified  Filed Weights   01/23/15 1444 01/23/15 1934  Weight: 95.255 kg (210 lb) 108.274 kg (238 lb 11.2 oz)    History of present illness:  This is a 54 year old lady presented to ED on 01/23/15 with 2 discrete episodes of left hand numbness and heaviness in a 12-18 hour period. No headache, visual disturbances,  LOC,or other limb weakness/numbness.She had a CVA in August 2015.She was hypertensive and diabetic.  Hospital Course:  1. TIA/ post stroke phenomenon. MRI does not show CVA. Hx stroke (right occiptial infarct 03/2014).  Risk factors included uncontrolled BP, diabetes, obesity dyslipidemia and obstructive sleep apnea. Physical therapy evaluation recommends HHPT. Occupational evaluation not available. No difficulty swallowing. FLP with LDL 105. A1c 7.6. Echo with EF 50% and grade 1 diastolic dysfunction mild hypokinesis. Carotid doppler bilateral carotid stenosis less 50%. Left hand remained "tingly". Encouraged weight loss, compliance with meds and DM control. Will change aspirin to Plavix and continue statin. Evaluated by neurology who opine likely post stroke phenomenon but recommended EEG. At discharge results pending and will be followed by Dr Gerilyn Pilgrim. Will be discharge with low dose Keppra per neuro recommendations. Dose will be adjusted 1 month  when followup  with Dr Gerilyn Pilgrim.  2. HTN. Poor control partly due to non-compliance with mediation. During inpatient stay she was getting medications and BP still difficult to control. Home medications include coreg, lisinopril hctz. Will increase lisinopril and HCTZ. Will follow up with PCP 02/10/15 for evaluation of BP control. 3. DM.Continue with home medications and SSI. See above. CBG range 116-144. On oral agents at home. 4. Obstructive sleep apnea: diagnosed per neuro. CPAP will be arranged per neuro   Procedures:  Echo Wall thickness was increased increased in a pattern of mild to moderate LVH. Systolic function was normal. The estimated ejection fraction was in the range of 50% to 55%. Doppler parameters are consistent with abnormal left ventricular relaxation (grade 1 diastolic dysfunction). (i.e. Studies not automatically included, echos, thoracentesis, etc; not x-rays)  Consultations:  Dr Gerilyn Pilgrim  Discharge Exam: Filed Vitals:   01/25/15 1544  BP: 181/93  Pulse: 77  Temp:   Resp:     General: obese appears well  Cardiovascular: rrr no mgr no Le edema Respiratory: normal effort BS clear bilaterally no wheeze  Discharge Instructions   Discharge Instructions    Diet - low sodium heart healthy    Complete by:  As directed      Discharge instructions    Complete by:  As directed   Take medications as directed Follow up with PCP 1 week for evaluation of BP and release to work note Follow up with Dr Gerilyn Pilgrim 4 weeks follow EEG and symptoms No driving, swimming, climbing ladders or operating machinery.     Increase activity slowly    Complete by:  As directed           Current  Discharge Medication List    START taking these medications   Details  levETIRAcetam (KEPPRA) 250 MG tablet Take 1 tablet (250 mg total) by mouth 2 (two) times daily. Qty: 90 tablet, Refills: 0      CONTINUE these medications which have CHANGED   Details  lisinopril-hydrochlorothiazide  (PRINZIDE,ZESTORETIC) 20-12.5 MG per tablet Take 2 tablets by mouth daily. Qty: 60 tablet, Refills: 0      CONTINUE these medications which have NOT CHANGED   Details  carvedilol (COREG) 6.25 MG tablet Take 1 tablet (6.25 mg total) by mouth 2 (two) times daily with a meal. Qty: 60 tablet, Refills: 6    clindamycin (CLEOCIN) 300 MG capsule Take 1 capsule by mouth 4 (four) times daily. 10 day course filled 01/12/2015 Refills: 0    clopidogrel (PLAVIX) 75 MG tablet Take 1 tablet (75 mg total) by mouth daily. Qty: 30 tablet, Refills: 1    doxycycline (VIBRA-TABS) 100 MG tablet Take 100 mg by mouth 2 (two) times daily. 10 day course starting 01/12/2015 Refills: 0    lovastatin (MEVACOR) 20 MG tablet Take 2 tablets by mouth daily.    metFORMIN (GLUCOPHAGE) 1000 MG tablet Take 1,000 mg by mouth 2 (two) times daily with a meal.    mupirocin ointment (BACTROBAN) 2 % Apply 1 application topically 3 (three) times daily as needed (for inect bite).  Refills: 0    potassium chloride (K-DUR) 10 MEQ tablet Take 1 tablet (10 mEq total) by mouth daily. Qty: 30 tablet, Refills: 1      STOP taking these medications     aspirin 325 MG tablet        No Known Allergies Follow-up Information    Follow up with Trinna Post, MD On 02/10/2015.   Specialty:  Family Medicine   Why:  at 2:30 pm   Contact information:   [address] [place] [address] [phone]       Follow up with [facility].   Contact information:   [address] [place] [address] [phone]       Follow up with Beryle Beams, MD.   Specialty:  Neurology   Contact information:   [address] [place] [address] [phone]        The results of significant diagnostics from this hospitalization (including imaging, microbiology, ancillary and laboratory) are listed below for reference.    Significant Diagnostic Studies: Ct Head Wo Contrast  01/23/2015    CLINICAL DATA:  Left arm and hand tingling. History of cerebral infarction.  EXAM: CT HEAD WITHOUT CONTRAST  TECHNIQUE: Contiguous axial images were obtained from the base of the skull through the vertex without intravenous contrast.  COMPARISON:  03/24/2014  FINDINGS: Stable old right occipital infarct. The brain demonstrates no evidence of hemorrhage, acute infarction, edema, mass effect, extra-axial fluid collection, hydrocephalus or mass lesion. The skull is unremarkable.  IMPRESSION: No acute findings.  Stable right occipital infarction.   Electronically Signed   By: Irish Lack M.D.   On: 01/23/2015 16:46   Mr Brain Wo Contrast  01/23/2015   CLINICAL DATA:  Weakness and left arm numbness, 1 day duration.  EXAM: MRI HEAD WITHOUT CONTRAST  TECHNIQUE: Multiplanar, multiecho pulse sequences of the brain and surrounding structures were obtained without intravenous contrast.  COMPARISON:  Head CT same day.  MRI 03/24/2014.  FINDINGS: Diffusion imaging does not show any acute or subacute infarction. The brainstem and cerebellum are normal. There is old infarction  in the right occipital lobe with atrophy and gliosis. There are mild to moderate chronic small-vessel ischemic changes in the hemispheric deep white matter bilaterally. No mass lesion, acute hemorrhage, hydrocephalus or extra-axial collection. No pituitary mass. No fluid in the sinuses, middle ears or mastoids.  IMPRESSION: No acute or reversible finding. Old infarction in the right occipital lobe. Mild to moderate chronic small-vessel changes of the cerebral hemispheric white matter.   Electronically Signed   By: Paulina Fusi M.D.   On: 01/23/2015 18:43   US Carotid Bilateral  01/24/2015   CLINICAL DATA:  TIA.  Hypertension, blurred vision, diabetes.  EXAM: BILATERAL CAROTID DUPLEX ULTRASOUND  TECHNIQUE: Wallace Cullens scale imaging, color Doppler and duplex ultrasound was performed of bilateral carotid and vertebral arteries in the neck.  COMPARISON:   03/24/2014  REVIEW OF SYSTEMS: Quantification of carotid stenosis is based on velocity parameters that correlate the residual internal carotid diameter with NASCET-based stenosis levels, using the diameter of the distal internal carotid lumen as the denominator for stenosis measurement.  The following velocity measurements were obtained:  PEAK SYSTOLIC/END DIASTOLIC  RIGHT  ICA:                     58/15cm/sec  CCA:                     63/15cm/sec  SYSTOLIC ICA/CCA RATIO:  0.9  DIASTOLIC ICA/CCA RATIO: 1.0  ECA:                     46cm/sec  LEFT  ICA:                     68/25cm/sec  CCA:                     68/20cm/sec  SYSTOLIC ICA/CCA RATIO:  1.0  DIASTOLIC ICA/CCA RATIO: 1.3  ECA:                     83cm/sec  FINDINGS: RIGHT CAROTID ARTERY: Smooth noncalcified plaque effaces the carotid bulb and extends into the proximal ICA. No high-grade stenosis. Normal waveforms and color Doppler signal.  RIGHT VERTEBRAL ARTERY:  Normal flow direction and waveform.  LEFT CAROTID ARTERY: Eccentric smooth noncalcified plaque in the carotid bulb. No high-grade stenosis. Normal waveforms and color Doppler signal.  LEFT VERTEBRAL ARTERY: Normal flow direction and waveform.  IMPRESSION: 1. Bilateral carotid bifurcation plaque resulting in less than 50% diameter stenosis. The exam does not exclude plaque ulceration or embolization. Continued surveillance recommended.   Electronically Signed   By: Corlis Leak M.D.   On: 01/24/2015 17:02    Microbiology: No results found for this or any previous visit (from the past 240 hour(s)).   Labs: Basic Metabolic Panel:  Recent Labs Lab 01/23/15 1520 01/23/15 1530 01/24/15 0523 01/25/15 0809  NA 141 143  --  143  K 2.8* 2.8*  --  4.0  CL 104 104  --  108  CO2 26  --   --  27  GLUCOSE 215* 218*  --  129*  BUN 16 16  --  14  CREATININE 0.94 1.00  --  0.73  CALCIUM 8.8*  --   --  8.7*  MG  --   --  1.8  --    Liver Function Tests:  Recent Labs Lab 01/23/15 1520  AST  24  ALT 19  ALKPHOS 68  BILITOT  0.3  PROT 7.5  ALBUMIN 3.6   No results for input(s): LIPASE, AMYLASE in the last 168 hours. No results for input(s): AMMONIA in the last 168 hours. CBC:  Recent Labs Lab 01/23/15 1520 01/23/15 1530  WBC 8.6  --   NEUTROABS 4.0  --   HGB 12.5 13.6  HCT 38.2 40.0  MCV 80.9  --   PLT 272  --    Cardiac Enzymes: No results for input(s): CKTOTAL, CKMB, CKMBINDEX, TROPONINI in the last 168 hours. BNP: BNP (last 3 results) No results for input(s): BNP in the last 8760 hours.  ProBNP (last 3 results) No results for input(s): PROBNP in the last 8760 hours.  CBG:  Recent Labs Lab 01/24/15 1122 01/24/15 1640 01/24/15 2130 01/25/15 0807 01/25/15 1133  GLUCAP 116* 113* 172* 101* 102*       Signed:  Jill Stopka M  [facility] 01/25/2015, 3:50 PM

## 2015-01-25 ENCOUNTER — Observation Stay (HOSPITAL_COMMUNITY)
Admit: 2015-01-25 | Discharge: 2015-01-25 | Disposition: A | Discharge status: Home / Self Care | Payer: BLUE CROSS/BLUE SHIELD | Attending: Internal Medicine | Admitting: Internal Medicine

## 2015-01-25 ENCOUNTER — Encounter (HOSPITAL_COMMUNITY): Payer: Self-pay | Admitting: Internal Medicine

## 2015-01-25 DIAGNOSIS — I10 Essential (primary) hypertension: Secondary | ICD-10-CM

## 2015-01-25 DIAGNOSIS — E785 Hyperlipidemia, unspecified: Secondary | ICD-10-CM

## 2015-01-25 DIAGNOSIS — M6289 Other specified disorders of muscle: Secondary | ICD-10-CM | POA: Diagnosis not present

## 2015-01-25 DIAGNOSIS — R531 Weakness: Secondary | ICD-10-CM | POA: Diagnosis present

## 2015-01-25 LAB — VITAMIN B12: Vitamin B-12: 386 pg/mL (ref 180–914)

## 2015-01-25 LAB — BASIC METABOLIC PANEL
Anion gap: 8 (ref 5–15)
BUN: 14 mg/dL (ref 6–20)
CO2: 27 mmol/L (ref 22–32)
Calcium: 8.7 mg/dL — ABNORMAL LOW (ref 8.9–10.3)
Chloride: 108 mmol/L (ref 101–111)
Creatinine, Ser: 0.73 mg/dL (ref 0.44–1.00)
GFR calc Af Amer: >60 mL/min (ref >60)
Glucose, Bld: 129 mg/dL — ABNORMAL HIGH (ref 65–99)
Potassium: 4 mmol/L (ref 3.5–5.1)
Sodium: 143 mmol/L (ref 135–145)

## 2015-01-25 LAB — GLUCOSE, CAPILLARY
GLUCOSE-CAPILLARY: 101 mg/dL — AB (ref 65–99)
Glucose-Capillary: 101 mg/dL — ABNORMAL HIGH (ref 65–99)
Glucose-Capillary: 102 mg/dL — ABNORMAL HIGH (ref 65–99)

## 2015-01-25 LAB — RPR: RPR: NONREACTIVE

## 2015-01-25 LAB — HIV ANTIBODY (ROUTINE TESTING W REFLEX): HIV Screen 4th Generation wRfx: NONREACTIVE

## 2015-01-25 LAB — HOMOCYSTEINE: HOMOCYSTEINE-NORM: 9.7 umol/L (ref 0.0–15.0)

## 2015-01-25 MED ORDER — HYDRALAZINE HCL 20 MG/ML IJ SOLN
5.0000 mg | Freq: Once | INTRAMUSCULAR | Status: AC
  Administered 2015-01-25: 5 mg via INTRAVENOUS
  Filled 2015-01-25: qty 1

## 2015-01-25 MED ORDER — LISINOPRIL 10 MG PO TABS: 40.0000 mg | ORAL_TABLET | Freq: Every day | ORAL | Status: DC

## 2015-01-25 MED ORDER — LISINOPRIL-HYDROCHLOROTHIAZIDE 20-12.5 MG PO TABS: 2.0000 | ORAL_TABLET | Freq: Every day | ORAL | Status: DC

## 2015-01-25 MED ORDER — HYDROCHLOROTHIAZIDE 25 MG PO TABS: 25.0000 mg | ORAL_TABLET | Freq: Every day | ORAL | Status: DC

## 2015-01-25 MED ORDER — LEVETIRACETAM 250 MG PO TABS
250.0000 mg | ORAL_TABLET | Freq: Two times a day (BID) | ORAL | Status: DC
  Administered 2015-01-25: 250 mg via ORAL
  Filled 2015-01-25: qty 1

## 2015-01-25 MED ORDER — LEVETIRACETAM 250 MG PO TABS: 250.0000 mg | ORAL_TABLET | Freq: Two times a day (BID) | ORAL | Status: DC

## 2015-01-25 NOTE — Evaluation (Signed)
Physical Therapy Evaluation Patient Details Name: Crystal Snow MRN: [id number] DOB: 08/09/1961 Today's Date: 01/25/2015   History of Present Illness  Crystal Snow is a 54 y.o. female with 12-18 hour history of left hand numbness with weakness of the left hand.No LOC,or other limb weakness/numbness.She had a CVA in August 2015.She is hypertensive and diabetic. At eval, pt reports L hand symptoms have gotten better but are still there.   Clinical Impression  Pt tolerating evaluation well, motivated and able to complete entire PT sesssion as planned. L hand paresthesia continues to persist as well as decreased fine motor ability. Recommending OT consult to further assess pt ability to perform all bathing, dressing needs, and address return of function in hand. Patient presenting with impairment of strength and balance, limiting ability to perform ADL and mobility tasks at baseline level of function. Patient will benefit from skilled intervention to address the above impairments and limitations, in order to restore to prior level of function, improve patient safety upon discharge, and to decrease caregiver burden.      Follow Up Recommendations Home health PT    Equipment Recommendations  None recommended by PT    Recommendations for Other Services OT consult     Precautions / Restrictions Precautions Precautions: None Restrictions Weight Bearing Restrictions: No      Mobility  Bed Mobility Overal bed mobility: Independent                Transfers Overall transfer level: Independent Equipment used: None                Ambulation/Gait Ambulation/Gait assistance: Supervision Ambulation Distance (Feet): 300 Feet Assistive device: None Gait Pattern/deviations: WFL(Within Functional Limits) Gait velocity: 1.63feet/sec Gait velocity interpretation: <1.8 ft/sec, indicative of risk for recurrent falls General Gait Details: mild LOB with sudden changes in  gait speed, but able to maintain balance during headturns.   Stairs            Wheelchair Mobility    Modified Rankin (Stroke Patients Only)       Balance                                 Standardized Balance Assessment Standardized Balance Assessment : Berg Balance Test Berg Balance Test Sit to Stand: Able to stand without using hands and stabilize independently Standing Unsupported: Able to stand safely 2 minutes Sitting with Back Unsupported but Feet Supported on Floor or Stool: Able to sit safely and securely 2 minutes Stand to Sit: Sits safely with minimal use of hands Transfers: Able to transfer safely, minor use of hands Standing Unsupported with Eyes Closed: Able to stand 10 seconds safely Standing Ubsupported with Feet Together: Able to place feet together independently and stand 1 minute safely From Standing, Reach Forward with Outstretched Arm: Can reach confidently >25 cm (10") From Standing Position, Pick up Object from Floor: Able to pick up shoe safely and easily From Standing Position, Turn to Look Behind Over each Shoulder: Turn sideways only but maintains balance Turn 360 Degrees: Able to turn 360 degrees safely but slowly Standing Unsupported, Alternately Place Feet on Step/Stool: Able to stand independently and complete 8 steps >20 seconds Standing Unsupported, One Foot in Front: Able to take small step independently and hold 30 seconds Standing on One Leg: Tries to lift leg/unable to hold 3 seconds but remains standing independently Total Score: 46  Pertinent Vitals/Pain Pain Assessment: No/denies pain    Home Living Family/patient expects to be discharged to:: Private residence Living Arrangements: Parent;Spouse/significant other (sister, mother )   Type of Home: Apartment Home Access: Stairs to enter   [employer] of Steps: 10 steps into basement appartment, can enter at ground level without steps.  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      Hand Dominance   Dominant Hand: Right    Extremity/Trunk Assessment   Upper Extremity Assessment: LUE deficits/detail       LUE Deficits / Details: Glove paresthesia in total left hand with 25% strength of R hand. Sequential digital opposition takes about 4x as  long on L as it does on R, and requires maximal focus to perform. Pt is able to tie shoes without complaint.    Lower Extremity Assessment: Overall WFL for tasks assessed      Cervical / Trunk Assessment: Normal  Communication   Communication: No difficulties  Cognition Arousal/Alertness: Awake/alert Behavior During Therapy: WFL for tasks assessed/performed Overall Cognitive Status: Within Functional Limits for tasks assessed                      General Comments      Exercises        Assessment/Plan    PT Assessment Patient needs continued PT services  PT Diagnosis Difficulty walking;Hemiplegia non-dominant side;Other (comment) (balance deficits)   PT Problem List Decreased strength;Decreased balance;Decreased coordination;Impaired sensation  PT Treatment Interventions Balance training;Stair training   PT Goals (Current goals can be found in the Care Plan section) Acute Rehab PT Goals Patient Stated Goal: Returnt to home/work and PLOF.  PT Goal Formulation: With patient Time For Goal Achievement: 02/08/15 Potential to Achieve Goals: Good    Frequency Min 3X/week   Barriers to discharge        Co-evaluation               End of Session Equipment Utilized During Treatment: Other (comment) Activity Tolerance: Patient tolerated treatment well Patient left: in bed;with call bell/phone within reach Nurse Communication: Other (comment)          Time: 0454-0981 PT Time Calculation (min) (ACUTE ONLY): 16 min   Charges:   PT Evaluation $Initial PT Evaluation Tier I: 1 Procedure     PT G Codes:        Buccola,Allan C 01-27-2015, 12:36 PM 12:38 PM  Rosamaria Lints, PT, DPT [place] License # [id number]

## 2015-01-25 NOTE — Care Management Note (Signed)
Case Management Note  Patient Details  Name: Crystal Snow MRN: [id number] Date of Birth: 07-02-1961  Subjective/Objective:                    Action/Plan:   Expected Discharge Date:                  Expected Discharge Plan:  Home w Home Health Services  In-House Referral:  NA  Discharge planning Services  CM Consult  Post Acute Care Choice:  Home Health Choice offered to:  Patient  DME Arranged:    DME Agency:     HH Arranged:  PT HH Agency:  [facility]  Status of Service:  Completed, signed off  Medicare Important Message Given:    Date Medicare IM Given:    Medicare IM give by:    Date Additional Medicare IM Given:    Additional Medicare Important Message give by:     If discussed at Long Length of Stay Meetings, dates discussed:    Additional Comments: Pt discharged home today with [facility] PT (per pts choice). Alroy Bailiff of [facility] is aware and will collect the pts information from the chart. HH services to start within 48 hours of discharge. No DME needs noted. Pt and pts nurse aware of discharge arrangements. Arlyss Queen [place], RN 01/25/2015, 1:56 PM

## 2015-01-25 NOTE — Progress Notes (Signed)
BP elevated, asymptomatic, K.Black, NP notified, orders received and carried out.

## 2015-01-25 NOTE — Progress Notes (Signed)
EEG Completed; Results Pending  

## 2015-01-25 NOTE — Progress Notes (Signed)
Patient discharging home.  IV removed - WNL.  Follow up appointments in place with PCP and neuro.  HH to follow at home per CM.  Reviewed changes to medications and new medicines with patient.  Emphasized importance of taking them as prescribed to prevent further stroke. Educational handouts given.  No questions at this time.  Verbalizes understanding of all DC instructions. Patient stable at this time, awaiting on arrival of family to transport home.

## 2015-01-27 NOTE — Procedures (Signed)
  [facility] Ayliana Casciano A. 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  MEDICATIONS: Scheduled Meds: Continuous Infusions: PRN Meds:.  Prior to Admission medications   Medication Sig Start Date End Date Taking? Authorizing Provider  carvedilol (COREG) 6.25 MG tablet Take 1 tablet (6.25 mg total) by mouth 2 (two) times daily with a meal. 06/01/14   Laqueta Linden, MD  clindamycin (CLEOCIN) 300 MG capsule Take 1 capsule by mouth 4 (four) times daily. 10 day course filled 01/12/2015 01/12/15   Historical Provider, MD  clopidogrel (PLAVIX) 75 MG tablet Take 1 tablet (75 mg total) by mouth daily. 04/27/14   Harle Battiest, NP  doxycycline (VIBRA-TABS) 100 MG tablet Take 100 mg by mouth 2 (two) times daily. 10 day course starting 01/12/2015 01/12/15   Historical Provider, MD  levETIRAcetam (KEPPRA) 250 MG tablet Take 1 tablet (250 mg total) by mouth 2 (two) times daily. 01/25/15   Gwenyth Bender, NP  lisinopril-hydrochlorothiazide (PRINZIDE,ZESTORETIC) 20-12.5 MG per tablet Take 2 tablets by mouth daily. 01/25/15   Gwenyth Bender, NP  lovastatin (MEVACOR) 20 MG tablet Take 2 tablets by mouth daily. 03/04/14   Historical Provider, MD  metFORMIN (GLUCOPHAGE) 1000 MG tablet Take 1,000 mg by mouth 2 (two) times daily with a meal.    Historical Provider, MD  mupirocin ointment (BACTROBAN) 2 % Apply 1 application topically 3 (three) times daily as needed (for inect bite).  01/13/15   Historical Provider, MD  potassium chloride (K-DUR) 10 MEQ tablet Take 1 tablet (10 mEq total) by mouth daily. 03/25/14   Gwenyth Bender, NP      ANALYSIS: A 16 channel recording using standard 10 20 measurements is conducted for 21 minutes. There is a well formed posterior dominant rhythm of 10 Hz which attenuates with eye opening. There is beta activity observed in the frontal areas. Photic stimulation  and hyperventilation are not carried out. There is mostly drowsy activity is observed although some sleep is of seen with K complexes and sleep spindles observed. There is no focal or lateral slowing. There is no epileptiform activity observed.   IMPRESSION: This is a normal recording of awake and sleep states.      Hence Derrick A. Gerilyn Pilgrim, M.D.  Diplomate, Biomedical engineer of Psychiatry and Neurology ( Neurology).

## 2015-02-13 ENCOUNTER — Other Ambulatory Visit (HOSPITAL_COMMUNITY): Payer: Self-pay | Admitting: Family Medicine

## 2015-02-13 DIAGNOSIS — Z1231 Encounter for screening mammogram for malignant neoplasm of breast: Secondary | ICD-10-CM

## 2015-03-06 ENCOUNTER — Ambulatory Visit (HOSPITAL_COMMUNITY): Payer: BLUE CROSS/BLUE SHIELD

## 2015-03-06 ENCOUNTER — Ambulatory Visit (HOSPITAL_COMMUNITY)
Admission: RE | Admit: 2015-03-06 | Discharge: 2015-03-06 | Disposition: A | Discharge status: Home / Self Care | Payer: BLUE CROSS/BLUE SHIELD | Source: Ambulatory Visit | Attending: Family Medicine | Admitting: Family Medicine

## 2015-03-06 DIAGNOSIS — Z1231 Encounter for screening mammogram for malignant neoplasm of breast: Secondary | ICD-10-CM | POA: Insufficient documentation

## 2015-10-04 ENCOUNTER — Encounter (HOSPITAL_COMMUNITY): Payer: Self-pay | Admitting: Cardiology

## 2015-10-04 ENCOUNTER — Emergency Department (HOSPITAL_COMMUNITY)
Admission: EM | Admit: 2015-10-04 | Discharge: 2015-10-04 | Disposition: A | Discharge status: Home / Self Care | Payer: BLUE CROSS/BLUE SHIELD | Attending: Emergency Medicine | Admitting: Emergency Medicine

## 2015-10-04 DIAGNOSIS — Z8669 Personal history of other diseases of the nervous system and sense organs: Secondary | ICD-10-CM | POA: Insufficient documentation

## 2015-10-04 DIAGNOSIS — Z8719 Personal history of other diseases of the digestive system: Secondary | ICD-10-CM | POA: Insufficient documentation

## 2015-10-04 DIAGNOSIS — I10 Essential (primary) hypertension: Secondary | ICD-10-CM

## 2015-10-04 DIAGNOSIS — Z8673 Personal history of transient ischemic attack (TIA), and cerebral infarction without residual deficits: Secondary | ICD-10-CM | POA: Insufficient documentation

## 2015-10-04 DIAGNOSIS — E785 Hyperlipidemia, unspecified: Secondary | ICD-10-CM | POA: Insufficient documentation

## 2015-10-04 DIAGNOSIS — Z7902 Long term (current) use of antithrombotics/antiplatelets: Secondary | ICD-10-CM | POA: Insufficient documentation

## 2015-10-04 DIAGNOSIS — E119 Type 2 diabetes mellitus without complications: Secondary | ICD-10-CM | POA: Insufficient documentation

## 2015-10-04 DIAGNOSIS — Z79899 Other long term (current) drug therapy: Secondary | ICD-10-CM | POA: Insufficient documentation

## 2015-10-04 DIAGNOSIS — E669 Obesity, unspecified: Secondary | ICD-10-CM | POA: Insufficient documentation

## 2015-10-04 MED ORDER — METFORMIN HCL 1000 MG PO TABS: 1000.0000 mg | ORAL_TABLET | Freq: Two times a day (BID) | ORAL | Status: DC

## 2015-10-04 MED ORDER — HYDROCHLOROTHIAZIDE 25 MG PO TABS: 25.0000 mg | ORAL_TABLET | Freq: Every day | ORAL | Status: DC

## 2015-10-04 NOTE — ED Provider Notes (Signed)
CSN: [id number]     Arrival date & time 10/04/15  1423 History   First MD Initiated Contact with Patient 10/04/15 1006     Chief Complaint  Patient presents with  . Hypertension     (Consider location/radiation/quality/duration/timing/severity/associated sxs/prior Treatment) Patient is a 55 y.o. female presenting with hypertension. The history is provided by the patient. No language interpreter was used.  Hypertension This is a new problem. The current episode started today. The problem occurs constantly. The problem has been gradually worsening. Pertinent negatives include no abdominal pain. Nothing aggravates the symptoms. She has tried nothing for the symptoms. The treatment provided no relief.  Pt reports she is out of blood pressure mediations and diabetes medication.  Pt reports she can not afford her medications.  (Pt feels like she can get hctz and metformin from 4 dollar list)  Pt is in the process of getting an MD.    Past Medical History  Diagnosis Date  . Hypertension   . Diabetes mellitus without complication (HCC)   . CVA (cerebral infarction)     03/2014  . Cholecystitis     lap chole 2006  . Obstructive sleep apnea   . Obesity (BMI 30-39.9)   . Dyslipidemia    Past Surgical History  Procedure Laterality Date  . Abdominal hysterectomy     History reviewed. No pertinent family history. Social History  Substance Use Topics  . Smoking status: Never Smoker   . Smokeless tobacco: Never Used  . Alcohol Use: No   OB History    No data available     Review of Systems  Gastrointestinal: Negative for abdominal pain.  All other systems reviewed and are negative.     Allergies  Review of patient's allergies indicates no known allergies.  Home Medications   Prior to Admission medications   Medication Sig Start Date End Date Taking? Authorizing Provider  carvedilol (COREG) 6.25 MG tablet Take 1 tablet (6.25 mg total) by mouth 2 (two) times daily with a meal.  06/01/14  Yes Laqueta Linden, MD  clopidogrel (PLAVIX) 75 MG tablet Take 1 tablet (75 mg total) by mouth daily. 04/27/14  Yes Harle Battiest, NP  levETIRAcetam (KEPPRA) 250 MG tablet Take 1 tablet (250 mg total) by mouth 2 (two) times daily. 01/25/15  Yes Lesle Chris Black, NP  lisinopril-hydrochlorothiazide (PRINZIDE,ZESTORETIC) 20-12.5 MG per tablet Take 2 tablets by mouth daily. 01/25/15  Yes Lesle Chris Black, NP  lovastatin (MEVACOR) 20 MG tablet Take 2 tablets by mouth daily. 03/04/14  Yes Historical Provider, MD  potassium chloride (K-DUR) 10 MEQ tablet Take 1 tablet (10 mEq total) by mouth daily. 03/25/14  Yes Lesle Chris Black, NP  hydrochlorothiazide (HYDRODIURIL) 25 MG tablet Take 1 tablet (25 mg total) by mouth daily. 10/04/15   Elson Areas, PA-C  metFORMIN (GLUCOPHAGE) 1000 MG tablet Take 1 tablet (1,000 mg total) by mouth 2 (two) times daily with a meal. 10/04/15   Elson Areas, PA-C   BP 209/148 mmHg  Pulse 115  Temp(Src) 97.9 F (36.6 C) (Oral)  Resp 16  Ht 5\' 2"  (1.575 m)  Wt 104.327 kg  BMI 42.06 kg/m2  SpO2 97% Physical Exam  Constitutional: She is oriented to person, place, and time. She appears well-developed and well-nourished.  HENT:  Head: Normocephalic.  Eyes: EOM are normal.  Neck: Normal range of motion.  Pulmonary/Chest: Effort normal.  Abdominal: She exhibits no distension.  Musculoskeletal: Normal range of motion.  Neurological: She is alert  and oriented to person, place, and time.  Psychiatric: She has a normal mood and affect.  Nursing note and vitals reviewed.   ED Course  Procedures (including critical care time) Labs Review Labs Reviewed - No data to display  Imaging Review No results found. I have personally reviewed and evaluated these images and lab results as part of my medical decision-making.   EKG Interpretation None      MDM   Final diagnoses:  Essential hypertension  Type 2 diabetes mellitus without complication, without long-term  current use of insulin (HCC)    Meds ordered this encounter  Medications  . metFORMIN (GLUCOPHAGE) 1000 MG tablet    Sig: Take 1 tablet (1,000 mg total) by mouth 2 (two) times daily with a meal.    Dispense:  60 tablet    Refill:  2    Order Specific Question:  Supervising Provider    Answer:  MILLER, BRIAN [[id number]]  . hydrochlorothiazide (HYDRODIURIL) 25 MG tablet    Sig: Take 1 tablet (25 mg total) by mouth daily.    Dispense:  30 tablet    Refill:  3    Order Specific Question:  Supervising Provider    Answer:  Eber Hong [[id number]]      Lonia Skinner Ryan, PA-C 10/04/15 1032  Vanetta Mulders, MD 10/05/15 2010

## 2015-10-04 NOTE — ED Notes (Signed)
Seen doctor for disability this morning and was sent to er for high blood pressure.  C/o being dizzy since yesterday.  Out of her blood pressure medicine times one month.

## 2015-10-04 NOTE — Discharge Instructions (Signed)
Diabetes and Exercise Exercising regularly is important. It is not just about losing weight. It has many health benefits, such as:  Improving your overall fitness, flexibility, and endurance.  Increasing your bone density.  Helping with weight control.  Decreasing your body fat.  Increasing your muscle strength.  Reducing stress and tension.  Improving your overall health. People with diabetes who exercise gain additional benefits because exercise:  Reduces appetite.  Improves the body's use of blood sugar (glucose).  Helps lower or control blood glucose.  Decreases blood pressure.  Helps control blood lipids (such as cholesterol and triglycerides).  Improves the body's use of the hormone insulin by:  Increasing the body's insulin sensitivity.  Reducing the body's insulin needs.  Decreases the risk for heart disease because exercising:  Lowers cholesterol and triglycerides levels.  Increases the levels of good cholesterol (such as high-density lipoproteins [HDL]) in the body.  Lowers blood glucose levels. YOUR ACTIVITY PLAN  Choose an activity that you enjoy, and set realistic goals. To exercise safely, you should begin practicing any new physical activity slowly, and gradually increase the intensity of the exercise over time. Your health care provider or diabetes educator can help create an activity plan that works for you. General recommendations include:  Encouraging children to engage in at least 60 minutes of physical activity each day.  Stretching and performing strength training exercises, such as yoga or weight lifting, at least 2 times per week.  Performing a total of at least 150 minutes of moderate-intensity exercise each week, such as brisk walking or water aerobics.  Exercising at least 3 days per week, making sure you allow no more than 2 consecutive days to pass without exercising.  Avoiding long periods of inactivity (90 minutes or more). When you  have to spend an extended period of time sitting down, take frequent breaks to walk or stretch. RECOMMENDATIONS FOR EXERCISING WITH TYPE 1 OR TYPE 2 DIABETES   Check your blood glucose before exercising. If blood glucose levels are greater than 240 mg/dL, check for urine ketones. Do not exercise if ketones are present.  Avoid injecting insulin into areas of the body that are going to be exercised. For example, avoid injecting insulin into:  The arms when playing tennis.  The legs when jogging.  Keep a record of:  Food intake before and after you exercise.  Expected peak times of insulin action.  Blood glucose levels before and after you exercise.  The type and amount of exercise you have done.  Review your records with your health care provider. Your health care provider will help you to develop guidelines for adjusting food intake and insulin amounts before and after exercising.  If you take insulin or oral hypoglycemic agents, watch for signs and symptoms of hypoglycemia. They include:  Dizziness.  Shaking.  Sweating.  Chills.  Confusion.  Drink plenty of water while you exercise to prevent dehydration or heat stroke. Body water is lost during exercise and must be replaced.  Talk to your health care provider before starting an exercise program to make sure it is safe for you. Remember, almost any type of activity is better than none.   This information is not intended to replace advice given to you by your health care provider. Make sure you discuss any questions you have with your health care provider.   Document Released: 10/26/2003 Document Revised: 12/20/2014 Document Reviewed: 01/12/2013 Elsevier Interactive Patient Education 2016 [employer]. Hypertension Hypertension, commonly called high blood pressure, is  when the force of blood pumping through your arteries is too strong. Your arteries are the blood vessels that carry blood from your heart throughout your  body. A blood pressure reading consists of a higher number over a lower number, such as 110/72. The higher number (systolic) is the pressure inside your arteries when your heart pumps. The lower number (diastolic) is the pressure inside your arteries when your heart relaxes. Ideally you want your blood pressure below 120/80. Hypertension forces your heart to work harder to pump blood. Your arteries may become narrow or stiff. Having untreated or uncontrolled hypertension can cause heart attack, stroke, kidney disease, and other problems. RISK FACTORS Some risk factors for high blood pressure are controllable. Others are not.  Risk factors you cannot control include:   Race. You may be at higher risk if you are African American.  Age. Risk increases with age.  Gender. Men are at higher risk than women before age 15 years. After age 13, women are at higher risk than men. Risk factors you can control include:  Not getting enough exercise or physical activity.  Being overweight.  Getting too much fat, sugar, calories, or salt in your diet.  Drinking too much alcohol. SIGNS AND SYMPTOMS Hypertension does not usually cause signs or symptoms. Extremely high blood pressure (hypertensive crisis) may cause headache, anxiety, shortness of breath, and nosebleed. DIAGNOSIS To check if you have hypertension, your health care provider will measure your blood pressure while you are seated, with your arm held at the level of your heart. It should be measured at least twice using the same arm. Certain conditions can cause a difference in blood pressure between your right and left arms. A blood pressure reading that is higher than normal on one occasion does not mean that you need treatment. If it is not clear whether you have high blood pressure, you may be asked to return on a different day to have your blood pressure checked again. Or, you may be asked to monitor your blood pressure at home for 1 or more  weeks. TREATMENT Treating high blood pressure includes making lifestyle changes and possibly taking medicine. Living a healthy lifestyle can help lower high blood pressure. You may need to change some of your habits. Lifestyle changes may include:  Following the DASH diet. This diet is high in fruits, vegetables, and whole grains. It is low in salt, red meat, and added sugars.  Keep your sodium intake below 2,300 mg per day.  Getting at least 30-45 minutes of aerobic exercise at least 4 times per week.  Losing weight if necessary.  Not smoking.  Limiting alcoholic beverages.  Learning ways to reduce stress. Your health care provider may prescribe medicine if lifestyle changes are not enough to get your blood pressure under control, and if one of the following is true:  You are 78-60 years of age and your systolic blood pressure is above 140.  You are 32 years of age or older, and your systolic blood pressure is above 150.  Your diastolic blood pressure is above 90.  You have diabetes, and your systolic blood pressure is over 140 or your diastolic blood pressure is over 90.  You have kidney disease and your blood pressure is above 140/90.  You have heart disease and your blood pressure is above 140/90. Your personal target blood pressure may vary depending on your medical conditions, your age, and other factors. HOME CARE INSTRUCTIONS  Have your blood pressure rechecked as  directed by your health care provider.   Take medicines only as directed by your health care provider. Follow the directions carefully. Blood pressure medicines must be taken as prescribed. The medicine does not work as well when you skip doses. Skipping doses also puts you at risk for problems.  Do not smoke.   Monitor your blood pressure at home as directed by your health care provider. SEEK MEDICAL CARE IF:   You think you are having a reaction to medicines taken.  You have recurrent headaches or  feel dizzy.  You have swelling in your ankles.  You have trouble with your vision. SEEK IMMEDIATE MEDICAL CARE IF:  You develop a severe headache or confusion.  You have unusual weakness, numbness, or feel faint.  You have severe chest or abdominal pain.  You vomit repeatedly.  You have trouble breathing. MAKE SURE YOU:   Understand these instructions.  Will watch your condition.  Will get help right away if you are not doing well or get worse.   This information is not intended to replace advice given to you by your health care provider. Make sure you discuss any questions you have with your health care provider.   Document Released: 08/05/2005 Document Revised: 12/20/2014 Document Reviewed: 05/28/2013 Elsevier Interactive Patient Education [employer]

## 2015-12-29 ENCOUNTER — Encounter (HOSPITAL_COMMUNITY): Payer: Self-pay | Admitting: *Deleted

## 2015-12-29 ENCOUNTER — Emergency Department (HOSPITAL_COMMUNITY): Payer: Self-pay

## 2015-12-29 ENCOUNTER — Other Ambulatory Visit: Payer: Self-pay

## 2015-12-29 ENCOUNTER — Emergency Department (HOSPITAL_COMMUNITY)
Admission: EM | Admit: 2015-12-29 | Discharge: 2015-12-30 | Disposition: A | Discharge status: Home / Self Care | Payer: Self-pay | Attending: Emergency Medicine | Admitting: Emergency Medicine

## 2015-12-29 DIAGNOSIS — M7989 Other specified soft tissue disorders: Secondary | ICD-10-CM | POA: Insufficient documentation

## 2015-12-29 DIAGNOSIS — J9801 Acute bronchospasm: Secondary | ICD-10-CM | POA: Insufficient documentation

## 2015-12-29 DIAGNOSIS — I10 Essential (primary) hypertension: Secondary | ICD-10-CM | POA: Insufficient documentation

## 2015-12-29 DIAGNOSIS — E876 Hypokalemia: Secondary | ICD-10-CM | POA: Insufficient documentation

## 2015-12-29 DIAGNOSIS — Z79899 Other long term (current) drug therapy: Secondary | ICD-10-CM | POA: Insufficient documentation

## 2015-12-29 DIAGNOSIS — E119 Type 2 diabetes mellitus without complications: Secondary | ICD-10-CM | POA: Insufficient documentation

## 2015-12-29 DIAGNOSIS — E669 Obesity, unspecified: Secondary | ICD-10-CM | POA: Insufficient documentation

## 2015-12-29 DIAGNOSIS — Z7984 Long term (current) use of oral hypoglycemic drugs: Secondary | ICD-10-CM | POA: Insufficient documentation

## 2015-12-29 DIAGNOSIS — E785 Hyperlipidemia, unspecified: Secondary | ICD-10-CM | POA: Insufficient documentation

## 2015-12-29 MED ORDER — ALBUTEROL SULFATE (2.5 MG/3ML) 0.083% IN NEBU
2.5000 mg | INHALATION_SOLUTION | Freq: Once | RESPIRATORY_TRACT | Status: AC
  Administered 2015-12-30: 2.5 mg via RESPIRATORY_TRACT
  Filled 2015-12-29: qty 3

## 2015-12-29 MED ORDER — NITROGLYCERIN 2 % TD OINT
1.0000 [in_us] | TOPICAL_OINTMENT | Freq: Once | TRANSDERMAL | Status: AC
Start: 2015-12-30 — End: 2015-12-30
  Administered 2015-12-30: 1 [in_us] via TOPICAL
  Filled 2015-12-29: qty 1

## 2015-12-29 MED ORDER — IPRATROPIUM-ALBUTEROL 0.5-2.5 (3) MG/3ML IN SOLN
3.0000 mL | Freq: Once | RESPIRATORY_TRACT | Status: AC
  Administered 2015-12-30: 3 mL via RESPIRATORY_TRACT
  Filled 2015-12-29: qty 3

## 2015-12-29 NOTE — ED Notes (Signed)
Pt arrived by EMS from home. Complaining of SOB.

## 2015-12-29 NOTE — ED Provider Notes (Signed)
CSN: [id number]     Arrival date & time 12/29/15  2326 History  By signing my name below, I, [facility], attest that this documentation has been prepared under the direction and in the presence of Devoria Albe, MD at 23:39 PM. Electronically Signed: Randell Patient, ED Scribe. 12/30/2015. 12:19 PM.    Chief Complaint  Patient presents with  . Shortness of Breath    The history is provided by the patient. No language interpreter was used.  HPI Comments: Crystal Snow is a 55 y.o. female brought in by EMS with an hx of HTN, DM, CVA, obesity, and obstructive sleep apnea who presents to the Emergency Department complaining of constant SOB onset 2 hours ago. 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 Per EMS note, she was placed on O2 en route to [facility] today with an pulse ox of 98%, CBG of 200, BP of 240/160, HR of 132. She reports associated nonproductive cough, wheezing, chest tightness, and intermittent leg edema (since starting her new job). She takes her all of her prescription medications as prescribed. Denies hx of asthma, COPD, or any other chronic pulmonary conditions. Denies cigarette smoking but reports that her sister who visited her today did smoke outside of her house. Denies similar symptoms in the past. Denies CP, sore throat, rhinorrhea, or any other symptoms currently.She denies prior history of asthma or any known asthma in her family.   PCP [facility]   Past Medical History  Diagnosis Date  . Hypertension   . Diabetes mellitus without complication (HCC)   . CVA (cerebral infarction)     03/2014  . Cholecystitis     lap chole 2006  . Obstructive sleep apnea   . Obesity (BMI 30-39.9)   . Dyslipidemia    Past Surgical History  Procedure Laterality Date  . Abdominal hysterectomy     No family history on file. Social  History  Substance Use Topics  . Smoking status: Never Smoker   . Smokeless tobacco: Never Used  . Alcohol Use: No   employed  OB History    No data available     Review of Systems  HENT: Negative for rhinorrhea and sore throat.   Respiratory: Positive for cough, chest tightness, shortness of breath and wheezing.   Cardiovascular: Positive for leg swelling. Negative for chest pain.  All other systems reviewed and are negative.   Allergies  Review of patient's allergies indicates no known allergies.  Home Medications   Prior to Admission medications   Medication Sig Start Date End Date Taking? Authorizing Provider  atorvastatin (LIPITOR) 10 MG tablet Take 10 mg by mouth daily.   Yes Historical Provider, MD  carvedilol (COREG) 6.25 MG tablet Take 1 tablet (6.25 mg total) by mouth 2 (two) times daily with a meal. 06/01/14  Yes Laqueta Linden, MD  clopidogrel (PLAVIX) 75 MG tablet Take 1 tablet (75 mg total) by mouth daily. 04/27/14  Yes Harle Battiest, NP  gabapentin (NEURONTIN) 300 MG capsule Take 300 mg by mouth 2 (two) times daily.   Yes Historical Provider, MD  hydrochlorothiazide (HYDRODIURIL) 25 MG tablet Take 1 tablet (25 mg total) by mouth daily. 10/04/15  Yes Lonia Skinner Sofia, PA-C  levETIRAcetam (KEPPRA) 250 MG tablet Take 1 tablet (250 mg total) by mouth 2 (two) times daily. 01/25/15  Yes Gwenyth Bender, NP  lisinopril-hydrochlorothiazide (PRINZIDE,ZESTORETIC) 20-12.5 MG per tablet Take 2 tablets by mouth daily. 01/25/15  Yes Lesle Chris Black, NP  lovastatin (MEVACOR) 20 MG tablet Take 2 tablets by mouth daily. 03/04/14  Yes Historical Provider, MD  metFORMIN (GLUCOPHAGE) 1000 MG tablet Take 1 tablet (1,000 mg total) by mouth 2 (two) times daily with a meal. 10/04/15  Yes Lonia Skinner Sofia, PA-C  potassium chloride (K-DUR) 10 MEQ tablet Take 1 tablet (10 mEq total) by mouth daily. 03/25/14  Yes Lesle Chris Black, NP  potassium chloride SA (K-DUR,KLOR-CON) 20 MEQ tablet Take 1 tablet (20  mEq total) by mouth 2 (two) times daily. 12/30/15   Devoria Albe, MD   ED Triage Vitals  Enc Vitals Group     BP 12/29/15 2332 183/124 mmHg     Pulse Rate 12/29/15 2332 102     Resp 12/29/15 2332 28     Temp 12/29/15 2332 98.6 F (37 C)     Temp Source 12/29/15 2332 Oral     SpO2 12/29/15 2332 94 %     Weight 12/29/15 2332 210 lb (95.255 kg)     Height 12/29/15 2332  (1.575 m)     Head Cir --      Peak Flow --      Pain Score 12/29/15 2328 0     Pain Loc --      Pain Edu? --      Excl. in GC? --     Laboratory interpretation all normal except hypertension  Physical Exam  Constitutional: She is oriented to person, place, and time. She appears well-developed and well-nourished.  Non-toxic appearance. She does not appear ill. No distress.  HENT:  Head: Normocephalic and atraumatic.  Right Ear: External ear normal.  Left Ear: External ear normal.  Nose: Nose normal. No mucosal edema or rhinorrhea.  Mouth/Throat: Oropharynx is clear and moist and mucous membranes are normal. No dental abscesses or uvula swelling.  Eyes: Conjunctivae and EOM are normal. Pupils are equal, round, and reactive to light.  Neck: Normal range of motion and full passive range of motion without pain. Neck supple.  Cardiovascular: Normal rate, regular rhythm and normal heart sounds.  Exam reveals no gallop and no friction rub.   No murmur heard. Pulmonary/Chest: Effort normal. No respiratory distress. She has wheezes. She has no rhonchi. She has no rales. She exhibits no tenderness and no crepitus.  Rare, scattered and expiratory wheezes.  Abdominal: Soft. Normal appearance and bowel sounds are normal. She exhibits no distension. There is no tenderness. There is no rebound and no guarding.  Musculoskeletal: Normal range of motion. She exhibits no edema or tenderness.  Moves all extremities well. NO edema at this time  Neurological: She is alert and oriented to person, place, and time. She has normal  strength. No cranial nerve deficit.  Skin: Skin is warm, dry and intact. No rash noted. No erythema. No pallor.  Psychiatric: She has a normal mood and affect. Her speech is normal and behavior is normal. Her mood appears not anxious.  Nursing note and vitals reviewed.   ED Course  Procedures (including critical care time) Medications  albuterol (PROVENTIL HFA;VENTOLIN HFA) 108 (90 Base) MCG/ACT inhaler 2 puff (not administered)  aerochamber Z-Stat Plus/medium 1 each (not administered)  ipratropium-albuterol (DUONEB) 0.5-2.5 (3) MG/3ML nebulizer solution 3 mL (3 mLs Nebulization Given 12/30/15 0018)  albuterol (PROVENTIL) (2.5 MG/3ML) 0.083% nebulizer solution 2.5 mg (2.5 mg Nebulization Given 12/30/15 0018)  nitroGLYCERIN (NITROGLYN) 2 %  ointment 1 inch (1 inch Topical Given 12/30/15 0003)  furosemide (LASIX) injection 60 mg (60 mg Intravenous Given 12/30/15 0033)  potassium chloride SA (K-DUR,KLOR-CON) CR tablet 40 mEq (40 mEq Oral Given 12/30/15 0112)  cloNIDine (CATAPRES) tablet 0.1 mg (0.1 mg Oral Given 12/30/15 0157)    DIAGNOSTIC STUDIES: Oxygen Saturation is 94% on RA, adequate by my interpretation.    COORDINATION OF CARE: 11:40 PM Will order breathing treatment, nitroglycerin ointment, chest x-ray, and labs. Discussed treatment plan with pt at bedside and pt agreed to plan.Patient was given a albuterol and Atrovent nebulizer treatment. She had an inch of Nitropaste placed on her chest for control of her blood pressure.  I reviewed her chest x-ray and I thought she had fluid in the fissure and she was given IV Lasix. After reviewing her laboratory results she was given oral potassium for her hypokalemia.  Recheck at 01:40 AM Blood pressure is 179/108. Patient states she's feeling much better. She states her breathing is improved. On reexam her lungs are clear.She was given oral clonidine for her BP.   03:15 BP is 152/92, she is feeling much better. I am going to refer her back to her  cardiologist to try to get her blood pressure under better control. She has been seen by Dr. Darl Householder. She was given an albuterol inhaler and a spacer to use for her wheezing. She was also discharged home with some potassium pills for her hypokalemia.  Labs Review Results for orders placed or performed during the hospital encounter of 12/29/15  Comprehensive metabolic panel  Result Value Ref Range   Sodium 137 135 - 145 mmol/L   Potassium 2.8 (L) 3.5 - 5.1 mmol/L   Chloride 106 101 - 111 mmol/L   CO2 25 22 - 32 mmol/L   Glucose, Bld 259 (H) 65 - 99 mg/dL   BUN 17 6 - 20 mg/dL   Creatinine, Ser 0.63 0.44 - 1.00 mg/dL   Calcium 9.1 8.9 - 01.6 mg/dL   Total Protein 7.2 6.5 - 8.1 g/dL   Albumin 3.5 3.5 - 5.0 g/dL   AST 34 15 - 41 U/L   ALT 36 14 - 54 U/L   Alkaline Phosphatase 75 38 - 126 U/L   Total Bilirubin 0.4 0.3 - 1.2 mg/dL   GFR calc non Af Amer >60 >60 mL/min   GFR calc Af Amer >60 >60 mL/min   Anion gap 6 5 - 15  CBC with Differential  Result Value Ref Range   WBC 7.4 4.0 - 10.5 K/uL   RBC 4.70 3.87 - 5.11 MIL/uL   Hemoglobin 12.4 12.0 - 15.0 g/dL   HCT 01.0 93.2 - 35.5 %   MCV 80.0 78.0 - 100.0 fL   MCH 26.4 26.0 - 34.0 pg   MCHC 33.0 30.0 - 36.0 g/dL   RDW 73.2 20.2 - 54.2 %   Platelets 275 150 - 400 K/uL   Neutrophils Relative % 41 %   Neutro Abs 3.0 1.7 - 7.7 K/uL   Lymphocytes Relative 52 %   Lymphs Abs 3.8 0.7 - 4.0 K/uL   Monocytes Relative 5 %   Monocytes Absolute 0.4 0.1 - 1.0 K/uL   Eosinophils Relative 2 %   Eosinophils Absolute 0.1 0.0 - 0.7 K/uL   Basophils Relative 0 %   Basophils Absolute 0.0 0.0 - 0.1 K/uL  Troponin I  Result Value Ref Range   Troponin I 0.03 <0.031 ng/mL  Brain natriuretic peptide  Result Value Ref  Range   B Natriuretic Peptide 152.0 (H) 0.0 - 100.0 pg/mL   Laboratory interpretation all normal except hypokalemia, hyperglycemia   Imaging Review Dg Chest 2 View  12/30/2015  CLINICAL DATA:  Sudden onset of dyspnea 3 hours  ago. Nonproductive cough for 2 weeks. EXAM: CHEST  2 VIEW COMPARISON:  None. FINDINGS: There is mild cardiomegaly. The lungs are clear. The pulmonary vasculature is normal. There is no large effusion. Hilar and mediastinal contours are unremarkable. IMPRESSION: Mild cardiomegaly.  No acute findings. Electronically Signed   By: Ellery Plunk M.D.   On: 12/30/2015 00:37   I have personally reviewed and evaluated these images and lab results as part of my medical decision-making.   EKG Interpretation   Date/Time:  Friday Dec 29 2015 23:34:03 EDT Ventricular Rate:  101 PR Interval:  180 QRS Duration: 100 QT Interval:  355 QTC Calculation: 460 R Axis:   39 Text Interpretation:  Sinus tachycardia Borderline repolarization  abnormality No significant change since last tracing 23 Jan 2015 Confirmed  by Rayvion Stumph  MD-I, Theodore Virgin ([id number]) on 12/30/2015 2:00:18 AM      MDM   Final diagnoses:  Bronchospasm  Hypokalemia  Essential hypertension   New Prescriptions   POTASSIUM CHLORIDE SA (K-DUR,KLOR-CON) 20 MEQ TABLET    Take 1 tablet (20 mEq total) by mouth 2 (two) times daily.    Plan discharge  Devoria Albe, MD, FACEP   I personally performed the services described in this documentation, which was scribed in my presence. The recorded information has been reviewed and considered.  Devoria Albe, MD, Concha Pyo, MD 12/30/15 [phone]

## 2015-12-30 LAB — CBC WITH DIFFERENTIAL/PLATELET
BASOS PCT: 0 %
Basophils Absolute: 0 10*3/uL (ref 0.0–0.1)
EOS ABS: 0.1 10*3/uL (ref 0.0–0.7)
Eosinophils Relative: 2 %
HCT: 37.6 % (ref 36.0–46.0)
HEMOGLOBIN: 12.4 g/dL (ref 12.0–15.0)
Lymphocytes Relative: 52 %
Lymphs Abs: 3.8 10*3/uL (ref 0.7–4.0)
MCH: 26.4 pg (ref 26.0–34.0)
MCHC: 33 g/dL (ref 30.0–36.0)
MCV: 80 fL (ref 78.0–100.0)
Monocytes Absolute: 0.4 10*3/uL (ref 0.1–1.0)
Monocytes Relative: 5 %
NEUTROS PCT: 41 %
Neutro Abs: 3 10*3/uL (ref 1.7–7.7)
PLATELETS: 275 10*3/uL (ref 150–400)
RBC: 4.7 MIL/uL (ref 3.87–5.11)
RDW: 15.1 % (ref 11.5–15.5)
WBC: 7.4 10*3/uL (ref 4.0–10.5)

## 2015-12-30 LAB — COMPREHENSIVE METABOLIC PANEL
ALK PHOS: 75 U/L (ref 38–126)
ALT: 36 U/L (ref 14–54)
AST: 34 U/L (ref 15–41)
Albumin: 3.5 g/dL (ref 3.5–5.0)
Anion gap: 6 (ref 5–15)
BILIRUBIN TOTAL: 0.4 mg/dL (ref 0.3–1.2)
BUN: 17 mg/dL (ref 6–20)
CALCIUM: 9.1 mg/dL (ref 8.9–10.3)
CO2: 25 mmol/L (ref 22–32)
CREATININE: 0.8 mg/dL (ref 0.44–1.00)
Chloride: 106 mmol/L (ref 101–111)
Glucose, Bld: 259 mg/dL — ABNORMAL HIGH (ref 65–99)
Potassium: 2.8 mmol/L — ABNORMAL LOW (ref 3.5–5.1)
Sodium: 137 mmol/L (ref 135–145)
Total Protein: 7.2 g/dL (ref 6.5–8.1)

## 2015-12-30 LAB — TROPONIN I: TROPONIN I: 0.03 ng/mL (ref <0.031)

## 2015-12-30 LAB — BRAIN NATRIURETIC PEPTIDE: B NATRIURETIC PEPTIDE 5: 152 pg/mL — AB (ref 0.0–100.0)

## 2015-12-30 MED ORDER — AEROCHAMBER Z-STAT PLUS/MEDIUM MISC: 1.0000 | Freq: Once | Status: DC

## 2015-12-30 MED ORDER — POTASSIUM CHLORIDE CRYS ER 20 MEQ PO TBCR: 20.0000 meq | EXTENDED_RELEASE_TABLET | Freq: Two times a day (BID) | ORAL | Status: DC

## 2015-12-30 MED ORDER — CLONIDINE HCL 0.1 MG PO TABS
0.1000 mg | ORAL_TABLET | Freq: Once | ORAL | Status: AC
  Administered 2015-12-30: 0.1 mg via ORAL
  Filled 2015-12-30: qty 1

## 2015-12-30 MED ORDER — ALBUTEROL SULFATE HFA 108 (90 BASE) MCG/ACT IN AERS
2.0000 | INHALATION_SPRAY | RESPIRATORY_TRACT | Status: DC | PRN
  Administered 2015-12-30: 2 via RESPIRATORY_TRACT
  Filled 2015-12-30: qty 6.7

## 2015-12-30 MED ORDER — POTASSIUM CHLORIDE CRYS ER 20 MEQ PO TBCR
40.0000 meq | EXTENDED_RELEASE_TABLET | Freq: Once | ORAL | Status: AC
  Administered 2015-12-30: 40 meq via ORAL
  Filled 2015-12-30: qty 2

## 2015-12-30 MED ORDER — FUROSEMIDE 10 MG/ML IJ SOLN
60.0000 mg | Freq: Once | INTRAMUSCULAR | Status: AC
  Administered 2015-12-30: 60 mg via INTRAVENOUS
  Filled 2015-12-30: qty 6

## 2015-12-30 NOTE — ED Notes (Signed)
Pt alert & oriented x4, stable gait. Patient given discharge instructions, paperwork & prescription(s). Patient  instructed to stop at the registration desk to finish any additional paperwork. Patient verbalized understanding. Pt left department w/ no further questions. 

## 2015-12-30 NOTE — Discharge Instructions (Signed)
Use the inhaler with the spacer for wheezing or shortness of breath. Take the potassium pills until gone, your potassium level was 2.8 tonight. Please call Dr Sharene Skeans office to have him try to get your blood pressure better controlled. Return to the ED if you get worse again.    Bronchospasm, Adult A bronchospasm is when the tubes that carry air in and out of your lungs (airways) spasm or tighten. During a bronchospasm it is hard to breathe. This is because the airways get smaller. A bronchospasm can be triggered by:  Allergies. These may be to animals, pollen, food, or mold.  Infection. This is a common cause of bronchospasm.  Exercise.  Irritants. These include pollution, cigarette smoke, strong odors, aerosol sprays, and paint fumes.  Weather changes.  Stress.  Being emotional. HOME CARE   Always have a plan for getting help. Know when to call your doctor and local emergency services (911 in the [place] Know where you can get emergency care.  Only take medicines as told by your doctor.  If you were prescribed an inhaler or nebulizer machine, ask your doctor how to use it correctly. Always use a spacer with your inhaler if you were given one.  Stay calm during an attack. Try to relax and breathe more slowly.  Control your home environment:  Change your heating and air conditioning filter at least once a month.  Limit your use of fireplaces and wood stoves.  Do not  smoke. Do not  allow smoking in your home.  Avoid perfumes and fragrances.  Get rid of pests (such as roaches and mice) and their droppings.  Throw away plants if you see mold on them.  Keep your house clean and dust free.  Replace carpet with wood, tile, or vinyl flooring. Carpet can trap dander and dust.  Use allergy-proof pillows, mattress covers, and box spring covers.  Wash bed sheets and blankets every week in hot water. Dry them in a dryer.  Use blankets that are made of polyester or  cotton.  Wash hands frequently. GET HELP IF:  You have muscle aches.  You have chest pain.  The thick spit you spit or cough up (sputum) changes from clear or white to yellow, green, gray, or bloody.  The thick spit you spit or cough up gets thicker.  There are problems that may be related to the medicine you are given such as:  A rash.  Itching.  Swelling.  Trouble breathing. GET HELP RIGHT AWAY IF:  You feel you cannot breathe or catch your breath.  You cannot stop coughing.  Your treatment is not helping you breathe better.  You have very bad chest pain. MAKE SURE YOU:   Understand these instructions.  Will watch your condition.  Will get help right away if you are not doing well or get worse.   This information is not intended to replace advice given to you by your health care provider. Make sure you discuss any questions you have with your health care provider.   Document Released: 06/02/2009 Document Revised: 08/26/2014 Document Reviewed: 01/26/2013 Elsevier Interactive Patient Education 2016 [employer].  Hypertension Hypertension is another name for high blood pressure. High blood pressure forces your heart to work harder to pump blood. A blood pressure reading has two numbers, which includes a higher number over a lower number (example: 110/72). HOME CARE   Have your blood pressure rechecked by your doctor.  Only take medicine as told by your doctor. Follow  the directions carefully. The medicine does not work as well if you skip doses. Skipping doses also puts you at risk for problems.  Do not smoke.  Monitor your blood pressure at home as told by your doctor. GET HELP IF:  You think you are having a reaction to the medicine you are taking.  You have repeat headaches or feel dizzy.  You have puffiness (swelling) in your ankles.  You have trouble with your vision. GET HELP RIGHT AWAY IF:   You get a very bad headache and are confused.  You  feel weak, numb, or faint.  You get chest or belly (abdominal) pain.  You throw up (vomit).  You cannot breathe very well. MAKE SURE YOU:   Understand these instructions.  Will watch your condition.  Will get help right away if you are not doing well or get worse.   This information is not intended to replace advice given to you by your health care provider. Make sure you discuss any questions you have with your health care provider.   Document Released: 01/22/2008 Document Revised: 08/10/2013 Document Reviewed: 05/28/2013 Elsevier Interactive Patient Education 2016 [employer].  Hypokalemia Hypokalemia means that the amount of potassium in the blood is lower than normal.Potassium is a chemical, called an electrolyte, that helps regulate the amount of fluid in the body. It also stimulates muscle contraction and helps nerves function properly.Most of the body's potassium is inside of cells, and only a very small amount is in the blood. Because the amount in the blood is so small, minor changes can be life-threatening. CAUSES  Antibiotics.  Diarrhea or vomiting.  Using laxatives too much, which can cause diarrhea.  Chronic kidney disease.  Water pills (diuretics).  Eating disorders (bulimia).  Low magnesium level.  Sweating a lot. SIGNS AND SYMPTOMS  Weakness.  Constipation.  Fatigue.  Muscle cramps.  Mental confusion.  Skipped heartbeats or irregular heartbeat (palpitations).  Tingling or numbness. DIAGNOSIS  Your health care provider can diagnose hypokalemia with blood tests. In addition to checking your potassium level, your health care provider may also check other lab tests. TREATMENT Hypokalemia can be treated with potassium supplements taken by mouth or adjustments in your current medicines. If your potassium level is very low, you may need to get potassium through a vein (IV) and be monitored in the hospital. A diet high in potassium is also helpful.  Foods high in potassium are:  Nuts, such as peanuts and pistachios.  Seeds, such as sunflower seeds and pumpkin seeds.  Peas, lentils, and lima beans.  Whole grain and bran cereals and breads.  Fresh fruit and vegetables, such as apricots, avocado, bananas, cantaloupe, kiwi, oranges, tomatoes, asparagus, and potatoes.  Orange and tomato juices.  Red meats.  Fruit yogurt. HOME CARE INSTRUCTIONS  Take all medicines as prescribed by your health care provider.  Maintain a healthy diet by including nutritious food, such as fruits, vegetables, nuts, whole grains, and lean meats.  If you are taking a laxative, be sure to follow the directions on the label. SEEK MEDICAL CARE IF:  Your weakness gets worse.  You feel your heart pounding or racing.  You are vomiting or having diarrhea.  You are diabetic and having trouble keeping your blood glucose in the normal range. SEEK IMMEDIATE MEDICAL CARE IF:  You have chest pain, shortness of breath, or dizziness.  You are vomiting or having diarrhea for more than 2 days.  You faint. MAKE SURE YOU:   Understand  these instructions.  Will watch your condition.  Will get help right away if you are not doing well or get worse.   This information is not intended to replace advice given to you by your health care provider. Make sure you discuss any questions you have with your health care provider.   Document Released: 08/05/2005 Document Revised: 08/26/2014 Document Reviewed: 02/05/2013 Elsevier Interactive Patient Education [employer].

## 2016-01-10 ENCOUNTER — Ambulatory Visit (INDEPENDENT_AMBULATORY_CARE_PROVIDER_SITE_OTHER): Payer: Self-pay | Admitting: Cardiovascular Disease

## 2016-01-10 ENCOUNTER — Encounter: Payer: Self-pay | Admitting: Cardiovascular Disease

## 2016-01-10 VITALS — BP 180/110 | HR 94 | Ht 67.0 in | Wt 237.0 lb

## 2016-01-10 DIAGNOSIS — I639 Cerebral infarction, unspecified: Secondary | ICD-10-CM

## 2016-01-10 DIAGNOSIS — I10 Essential (primary) hypertension: Secondary | ICD-10-CM

## 2016-01-10 DIAGNOSIS — I350 Nonrheumatic aortic (valve) stenosis: Secondary | ICD-10-CM

## 2016-01-10 DIAGNOSIS — I429 Cardiomyopathy, unspecified: Secondary | ICD-10-CM

## 2016-01-10 DIAGNOSIS — E785 Hyperlipidemia, unspecified: Secondary | ICD-10-CM

## 2016-01-10 MED ORDER — CARVEDILOL 12.5 MG PO TABS: 12.5000 mg | ORAL_TABLET | Freq: Two times a day (BID) | ORAL | Status: DC

## 2016-01-10 NOTE — Addendum Note (Signed)
Addended by: Abelino Derrick R on: 01/10/2016 09:22 AM   Modules accepted: Orders

## 2016-01-10 NOTE — Progress Notes (Signed)
Patient ID: Crystal Snow, female   DOB: [birth year], 55 y.o.   MRN: [id number]      SUBJECTIVE: The patient presents for overdue follow-up for history of cardiomyopathy. I last evaluated her in October 2015. Prior nuclear myocardial perfusion imaging did not demonstrate any evidence of myocardial ischemia or scar. Prior cardiac monitoring demonstrated sinus rhythm with no evidence that her fibrillation. She has a history of old occipital infarct.  Most recent echocardiogram performed on 01/24/15 showed normal left ventricle systolic function, EF 50-55%, mild to moderate LVH, grade 1 diastolic dysfunction, wall motion abnormalities, mild aortic stenosis, and mild mitral regurgitation.  ECG 12/29/15 which I personally interpreted showed sinus tachycardia, heart rate 101 bpm.  Evaluated for shortness of breath in the ED on 12/29/15. Chest x-ray did not show pneumonia or pulmonary edema. Troponin was normal. Potassium markedly low at 2.8. CBC normal. BNP very mildly elevated, 152.  She denies chest pain. She has shortness of breath alleviated by using an inhaler which was recently prescribed. She said her blood pressure is always elevated when she sees healthcare providers. She is scheduled to see a PCP on 5/31 at [facility]. She said she has been unemployed since last year.  Review of Systems: As per "subjective", otherwise negative.  No Known Allergies  Current Outpatient Prescriptions  Medication Sig Dispense Refill  . atorvastatin (LIPITOR) 10 MG tablet Take 10 mg by mouth daily.    . carvedilol (COREG) 6.25 MG tablet Take 1 tablet (6.25 mg total) by mouth 2 (two) times daily with a meal. 60 tablet 6  . clopidogrel (PLAVIX) 75 MG tablet Take 1 tablet (75 mg total) by mouth daily. 30 tablet 1  . gabapentin (NEURONTIN) 300 MG capsule Take 300 mg by mouth 2 (two) times daily.    . hydrochlorothiazide (HYDRODIURIL) 25 MG tablet Take 1 tablet (25 mg total) by mouth daily. 30  tablet 3  . levETIRAcetam (KEPPRA) 250 MG tablet Take 1 tablet (250 mg total) by mouth 2 (two) times daily. 90 tablet 0  . lisinopril-hydrochlorothiazide (PRINZIDE,ZESTORETIC) 20-12.5 MG per tablet Take 2 tablets by mouth daily. 60 tablet 0  . lovastatin (MEVACOR) 20 MG tablet Take 2 tablets by mouth daily.    . metFORMIN (GLUCOPHAGE) 1000 MG tablet Take 1 tablet (1,000 mg total) by mouth 2 (two) times daily with a meal. 60 tablet 2  . potassium chloride (K-DUR) 10 MEQ tablet Take 1 tablet (10 mEq total) by mouth daily. 30 tablet 1  . potassium chloride SA (K-DUR,KLOR-CON) 20 MEQ tablet Take 1 tablet (20 mEq total) by mouth 2 (two) times daily. 10 tablet 0   No current facility-administered medications for this visit.    Past Medical History  Diagnosis Date  . Hypertension   . Diabetes mellitus without complication (HCC)   . CVA (cerebral infarction)     03/2014  . Cholecystitis     lap chole 2006  . Obstructive sleep apnea   . Obesity (BMI 30-39.9)   . Dyslipidemia     Past Surgical History  Procedure Laterality Date  . Abdominal hysterectomy      Social History   Social History  . Marital Status: Single    Spouse Name: N/A  . Number of Children: N/A  . Years of Education: N/A   Occupational History  . Not on file.   Social History Main Topics  . Smoking status: Never Smoker   . Smokeless tobacco: Never Used  . Alcohol Use: No  .  Drug Use: No  . Sexual Activity: Not on file   Other Topics Concern  . Not on file   Social History Narrative     Filed Vitals:   01/10/16 0852  BP: 180/110  Pulse: 94  Height:  (1.702 m)  Weight: 237 lb (107.502 kg)  SpO2: 97%    PHYSICAL EXAM General: NAD HEENT: Normal. Neck: No JVD, no thyromegaly. Lungs: Clear to auscultation bilaterally with normal respiratory effort. CV: Nondisplaced PMI.  Regular rate and rhythm, normal S1/S2, no S3/S4, soft 1/6 systolic murmur over RUSB. No pretibial edema.  No carotid bruit.     Abdomen: Soft, obese.  Neurologic: Alert and oriented.  Psych: Normal affect. Skin: Normal. Musculoskeletal: No gross deformities.    ECG: Most recent ECG reviewed.      ASSESSMENT AND PLAN: 1. Cardiomyopathy: LV systolic function normal 01/2015.  2. Essential HTN: Markedly elevated. Will increase Coreg to 12.5 mg bid.  3. Hyperlipidemia: Continue Lipitor 10 mg.  4. Aortic stenosis: Mild in 01/2015. Will monitor.  5. CVA: Continue Plavix and statin.  Dispo: fu 1 year.   Prentice Docker, M.D., F.A.C.C.

## 2016-01-10 NOTE — Patient Instructions (Signed)
Medication Instructions:  INCREASE COREG TO 12.5 MG TWO TIMES DAILY  Labwork: NONE  Testing/Procedures: NONE  Follow-Up: Your physician wants you to follow-up in:1 YEAR WITH DR. Purvis Sheffield .  You will receive a reminder letter in the mail two months in advance. If you don't receive a letter, please call our office to schedule the follow-up appointment.   Any Other Special Instructions Will Be Listed Below (If Applicable).     If you need a refill on your cardiac medications before your next appointment, please call your pharmacy.

## 2016-03-26 ENCOUNTER — Encounter (HOSPITAL_COMMUNITY): Payer: Self-pay | Admitting: Emergency Medicine

## 2016-03-26 ENCOUNTER — Emergency Department (HOSPITAL_COMMUNITY): Payer: Self-pay

## 2016-03-26 ENCOUNTER — Emergency Department (HOSPITAL_COMMUNITY)
Admission: EM | Admit: 2016-03-26 | Discharge: 2016-03-26 | Disposition: A | Discharge status: Home / Self Care | Payer: Self-pay | Attending: Emergency Medicine | Admitting: Emergency Medicine

## 2016-03-26 DIAGNOSIS — H538 Other visual disturbances: Secondary | ICD-10-CM

## 2016-03-26 DIAGNOSIS — M10022 Idiopathic gout, left elbow: Secondary | ICD-10-CM | POA: Insufficient documentation

## 2016-03-26 DIAGNOSIS — M109 Gout, unspecified: Secondary | ICD-10-CM

## 2016-03-26 DIAGNOSIS — I10 Essential (primary) hypertension: Secondary | ICD-10-CM | POA: Insufficient documentation

## 2016-03-26 DIAGNOSIS — E119 Type 2 diabetes mellitus without complications: Secondary | ICD-10-CM | POA: Insufficient documentation

## 2016-03-26 DIAGNOSIS — Z79899 Other long term (current) drug therapy: Secondary | ICD-10-CM | POA: Insufficient documentation

## 2016-03-26 DIAGNOSIS — Z7984 Long term (current) use of oral hypoglycemic drugs: Secondary | ICD-10-CM | POA: Insufficient documentation

## 2016-03-26 HISTORY — DX: Gout, unspecified: M10.9

## 2016-03-26 LAB — CBC WITH DIFFERENTIAL/PLATELET
BASOS ABS: 0 10*3/uL (ref 0.0–0.1)
Basophils Relative: 0 %
EOS PCT: 1 %
Eosinophils Absolute: 0.1 10*3/uL (ref 0.0–0.7)
HCT: 39.2 % (ref 36.0–46.0)
HEMOGLOBIN: 12.8 g/dL (ref 12.0–15.0)
Lymphocytes Relative: 44 %
Lymphs Abs: 4 10*3/uL (ref 0.7–4.0)
MCH: 26.2 pg (ref 26.0–34.0)
MCHC: 32.7 g/dL (ref 30.0–36.0)
MCV: 80.2 fL (ref 78.0–100.0)
MONOS PCT: 6 %
Monocytes Absolute: 0.5 10*3/uL (ref 0.1–1.0)
Neutro Abs: 4.4 10*3/uL (ref 1.7–7.7)
Neutrophils Relative %: 49 %
PLATELETS: 263 10*3/uL (ref 150–400)
RBC: 4.89 MIL/uL (ref 3.87–5.11)
RDW: 14.7 % (ref 11.5–15.5)
WBC: 9.1 10*3/uL (ref 4.0–10.5)

## 2016-03-26 LAB — BASIC METABOLIC PANEL
ANION GAP: 9 (ref 5–15)
BUN: 12 mg/dL (ref 6–20)
CHLORIDE: 102 mmol/L (ref 101–111)
CO2: 27 mmol/L (ref 22–32)
Calcium: 8.3 mg/dL — ABNORMAL LOW (ref 8.9–10.3)
Creatinine, Ser: 0.77 mg/dL (ref 0.44–1.00)
GFR calc Af Amer: >60 mL/min (ref >60)
Glucose, Bld: 271 mg/dL — ABNORMAL HIGH (ref 65–99)
POTASSIUM: 2.9 mmol/L — AB (ref 3.5–5.1)
SODIUM: 138 mmol/L (ref 135–145)

## 2016-03-26 MED ORDER — HYDROCODONE-ACETAMINOPHEN 5-325 MG PO TABS: 1.0000 | ORAL_TABLET | Freq: Four times a day (QID) | ORAL | 0 refills | Status: DC | PRN

## 2016-03-26 MED ORDER — INDOMETHACIN 25 MG PO CAPS: 25.0000 mg | ORAL_CAPSULE | Freq: Three times a day (TID) | ORAL | 0 refills | Status: DC | PRN

## 2016-03-26 NOTE — ED Notes (Signed)
Pt reports the pain in her elbow is similar to a gout flare up and reports she normally wears glasses at home, 1 year since vision has been checked.

## 2016-03-26 NOTE — ED Triage Notes (Signed)
Patient complaining of blurry vision and left arm pain since yesterday morning. 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

## 2016-03-26 NOTE — Discharge Instructions (Signed)
Indomethacin as prescribed.  Hydrocodone as prescribed as needed for pain.  Return to the Emergency Department if symptoms significantly worsen or change.

## 2016-03-26 NOTE — ED Provider Notes (Signed)
[facility]-EM[facility]Y DEPT Provider Note   CSN: [id number] Arrival date & time: 03/26/16  0909  First Provider Contact:  First MD Initiated Contact with Patient 03/26/16 [phone]    By signing my name below, I, Levon Hedger, attest that this documentation has been prepared under the direction and in the presence of Geoffery Lyons, MD . Electronically Signed: Levon Hedger, Scribe. 03/26/2016. 9:44 AM.  History   Chief Complaint Chief Complaint  Patient presents with  . Blurred Vision  . Arm Pain    HPI Crystal Snow is a 55 y.o. female with PMHx TIA, CVA, DM, gout, HTN and cardiomyopathy who presents to the Emergency Department complaining of constant left elbow pain which began two days ago. Pain is worsened with movement. No alleviating factors noted. She notes associated weakness and numbness in her left arm. She also complains of blurred vision in bilateral eyes. Per chart, she is taking lisinopril-hydrochlorothiazide. She denies any injury. Pt denies any left-sided weakness. Pt has no other complaints at this time.   The history is provided by the patient. No language interpreter was used.  Arm Pain  This is a recurrent problem. The current episode started 2 days ago. The problem occurs constantly. The problem has not changed since onset.The symptoms are aggravated by bending and exertion.    Past Medical History:  Diagnosis Date  . Cholecystitis    lap chole 2006  . CVA (cerebral infarction)    03/2014  . Diabetes mellitus without complication (HCC)   . Dyslipidemia   . Gout   . Hypertension   . Obesity (BMI 30-39.9)   . Obstructive sleep apnea     Patient Active Problem List   Diagnosis Date Noted  . Left-sided weakness 01/25/2015  . TIA (transient ischemic attack) 01/23/2015  . Cardiomyopathy (HCC) 04/13/2014  . CVA (cerebral infarction) 03/24/2014  . Hypokalemia 03/24/2014  . Obesity 03/24/2014  . Diabetes (HCC) 11/30/2007  . Dyslipidemia 11/30/2007  . OBESITY  11/30/2007  . Essential hypertension 11/30/2007    Past Surgical History:  Procedure Laterality Date  . ABDOMINAL HYSTERECTOMY      OB History    No data available       Home Medications    Prior to Admission medications   Medication Sig Start Date End Date Taking? Authorizing Provider  atorvastatin (LIPITOR) 10 MG tablet Take 10 mg by mouth daily.    Historical Provider, MD  carvedilol (COREG) 12.5 MG tablet Take 1 tablet (12.5 mg total) by mouth 2 (two) times daily. 01/10/16   Laqueta Linden, MD  clopidogrel (PLAVIX) 75 MG tablet Take 1 tablet (75 mg total) by mouth daily. 04/27/14   Harle Battiest, NP  gabapentin (NEURONTIN) 300 MG capsule Take 300 mg by mouth 2 (two) times daily.    Historical Provider, MD  hydrochlorothiazide (HYDRODIURIL) 25 MG tablet Take 1 tablet (25 mg total) by mouth daily. 10/04/15   Elson Areas, PA-C  levETIRAcetam (KEPPRA) 250 MG tablet Take 1 tablet (250 mg total) by mouth 2 (two) times daily. 01/25/15   Gwenyth Bender, NP  lisinopril-hydrochlorothiazide (PRINZIDE,ZESTORETIC) 20-12.5 MG per tablet Take 2 tablets by mouth daily. 01/25/15   Gwenyth Bender, NP  lovastatin (MEVACOR) 20 MG tablet Take 2 tablets by mouth daily. 03/04/14   Historical Provider, MD  metFORMIN (GLUCOPHAGE) 1000 MG tablet Take 1 tablet (1,000 mg total) by mouth 2 (two) times daily with a meal. 10/04/15   Elson Areas, PA-C  potassium chloride (K-DUR) 10  MEQ tablet Take 1 tablet (10 mEq total) by mouth daily. 03/25/14   Gwenyth Bender, NP  potassium chloride SA (K-DUR,KLOR-CON) 20 MEQ tablet Take 1 tablet (20 mEq total) by mouth 2 (two) times daily. 12/30/15   Devoria Albe, MD    Family History History reviewed. No pertinent family history.  Social History Social History  Substance Use Topics  . Smoking status: Never Smoker  . Smokeless tobacco: Never Used  . Alcohol use No     Allergies   Review of patient's allergies indicates no known allergies.   Review of  Systems Review of Systems  Constitutional: Negative for fever.  Musculoskeletal: Positive for arthralgias and myalgias.  All other systems reviewed and are negative.    Physical Exam Updated Vital Signs BP (!) 190/105 (BP Location: Right Arm)   Pulse 101   Temp 98.5 F (36.9 C) (Oral)   Resp 18   Ht  (1.575 m)   Wt 210 lb (95.3 kg)   SpO2 99%   BMI 38.41 kg/m   Physical Exam  Constitutional: She is oriented to person, place, and time. She appears well-developed and well-nourished. No distress.  HENT:  Head: Normocephalic and atraumatic.  Eyes: EOM are normal.  Neck: Normal range of motion.  Cardiovascular: Normal rate, regular rhythm and normal heart sounds.   Pulmonary/Chest: Effort normal and breath sounds normal.  Abdominal: Soft. She exhibits no distension. There is no tenderness.  Musculoskeletal: Normal range of motion.  The left elbow is painful with ROM. It feels warm to the touch. Ulnar and radial pulses are palpable.   Neurological: She is alert and oriented to person, place, and time. No cranial nerve deficit. She exhibits abnormal muscle tone. Coordination normal.  The hand grip strength of the left hand is diminished, likely secondary to pain.   Skin: Skin is warm and dry.  Psychiatric: She has a normal mood and affect. Judgment normal.  Nursing note and vitals reviewed.    ED Treatments / Results  DIAGNOSTIC STUDIES:  Oxygen Saturation is 99% on RA, normal by my interpretation.    COORDINATION OF CARE:  9:37 AM Will order CT head, BMP, and CBC. Discussed treatment plan with pt at bedside and pt agreed to plan.  Labs (all labs ordered are listed, but only abnormal results are displayed) Labs Reviewed  CBC WITH DIFFERENTIAL/PLATELET  BASIC METABOLIC PANEL    EKG  EKG Interpretation None       Radiology No results found.  Procedures Procedures (including critical care time)  Medications Ordered in ED Medications - No data to  display   Initial Impression / Assessment and Plan / ED Course  I have reviewed the triage vital signs and the nursing notes.  Pertinent labs & imaging results that were available during my care of the patient were reviewed by me and considered in my medical decision making (see chart for details).  Clinical Course      Final Clinical Impressions(s) / ED Diagnoses   Final diagnoses:  None   Patient presents with complaints of left arm pain and blurry vision. Her neurologic exam is nonfocal. She is having swelling and pain to the left elbow that I suspect is a flareup of gout. She has pain with range of motion of the elbow and a history of gout.  She will be treated with indomethacin, pain medication, and when necessary follow-up with her primary doctor.   I personally performed the services described in this documentation, which  was scribed in my presence. The recorded information has been reviewed and is accurate.    New Prescriptions New Prescriptions   No medications on file     Geoffery Lyons, MD 03/26/16 1129

## 2016-03-26 NOTE — ED Notes (Signed)
EDP at bedside  

## 2016-04-08 ENCOUNTER — Other Ambulatory Visit (HOSPITAL_COMMUNITY): Payer: Self-pay | Admitting: Family

## 2016-04-08 DIAGNOSIS — Z1231 Encounter for screening mammogram for malignant neoplasm of breast: Secondary | ICD-10-CM

## 2016-04-11 ENCOUNTER — Ambulatory Visit (HOSPITAL_COMMUNITY): Payer: Self-pay

## 2017-01-20 ENCOUNTER — Ambulatory Visit: Payer: Self-pay | Admitting: Cardiovascular Disease

## 2017-01-20 ENCOUNTER — Encounter: Payer: Self-pay | Admitting: Cardiovascular Disease

## 2017-01-23 ENCOUNTER — Encounter: Payer: Self-pay | Admitting: Cardiovascular Disease

## 2017-01-23 ENCOUNTER — Ambulatory Visit (INDEPENDENT_AMBULATORY_CARE_PROVIDER_SITE_OTHER): Payer: Self-pay | Admitting: Cardiovascular Disease

## 2017-01-23 VITALS — BP 126/88 | HR 89 | Ht 62.0 in | Wt 237.0 lb

## 2017-01-23 DIAGNOSIS — I429 Cardiomyopathy, unspecified: Secondary | ICD-10-CM

## 2017-01-23 DIAGNOSIS — Z8673 Personal history of transient ischemic attack (TIA), and cerebral infarction without residual deficits: Secondary | ICD-10-CM

## 2017-01-23 DIAGNOSIS — E782 Mixed hyperlipidemia: Secondary | ICD-10-CM

## 2017-01-23 DIAGNOSIS — I350 Nonrheumatic aortic (valve) stenosis: Secondary | ICD-10-CM

## 2017-01-23 DIAGNOSIS — I10 Essential (primary) hypertension: Secondary | ICD-10-CM

## 2017-01-23 NOTE — Progress Notes (Signed)
SUBJECTIVE: The patient presents for follow-up of cardiomyopathy.  Most recent echocardiogram performed on 01/24/15 showed normal left ventricle systolic function, EF 50-55%, mild to moderate LVH, grade 1 diastolic dysfunction, wall motion abnormalities, mild aortic stenosis, and mild mitral regurgitation.  She denies exertional chest pain. She also denies palpitations. She says her feet swell so she often wears flip-flops. She denies syncope. She denies orthopnea and paroxysmal nocturnal dyspnea. She has chronic exertional dyspnea which is stable.   Review of Systems: As per "subjective", otherwise negative.  No Known Allergies  Current Outpatient Prescriptions  Medication Sig Dispense Refill  . allopurinol (ZYLOPRIM) 100 MG tablet Take 100 mg by mouth daily.  2  . amLODipine (NORVASC) 10 MG tablet Take 10 mg by mouth daily.  3  . atorvastatin (LIPITOR) 10 MG tablet Take 10 mg by mouth daily.    . carvedilol (COREG) 12.5 MG tablet Take 1 tablet (12.5 mg total) by mouth 2 (two) times daily. 180 tablet 3  . gabapentin (NEURONTIN) 300 MG capsule Take 300 mg by mouth 2 (two) times daily.    Marland Kitchen glipiZIDE (GLUCOTROL) 5 MG tablet Take 5 mg by mouth daily.  3  . lisinopril-hydrochlorothiazide (PRINZIDE,ZESTORETIC) 20-25 MG tablet Take 1 tablet by mouth daily.  3  . metFORMIN (GLUCOPHAGE) 1000 MG tablet Take 1 tablet (1,000 mg total) by mouth 2 (two) times daily with a meal. 60 tablet 2   No current facility-administered medications for this visit.     Past Medical History:  Diagnosis Date  . Cholecystitis    lap chole 2006  . CVA (cerebral infarction)    03/2014  . Diabetes mellitus without complication (HCC)   . Dyslipidemia   . Gout   . Hypertension   . Obesity (BMI 30-39.9)   . Obstructive sleep apnea     Past Surgical History:  Procedure Laterality Date  . ABDOMINAL HYSTERECTOMY      Social History   Social History  . Marital status: Single    Spouse name: N/A  .  Number of children: N/A  . Years of education: N/A   Occupational History  . Not on file.   Social History Main Topics  . Smoking status: Never Smoker  . Smokeless tobacco: Never Used  . Alcohol use No  . Drug use: No  . Sexual activity: Not on file   Other Topics Concern  . Not on file   Social History Narrative  . No narrative on file     Vitals:   01/23/17 1253  BP: 126/88  Pulse: 89  SpO2: 96%  Weight: 237 lb (107.5 kg)  Height: 5\' 2"  (1.575 m)    Wt Readings from Last 3 Encounters:  01/23/17 237 lb (107.5 kg)  03/26/16 210 lb (95.3 kg)  01/10/16 237 lb (107.5 kg)     PHYSICAL EXAM General: NAD HEENT: Normal. Neck: No JVD, no thyromegaly. Lungs: Clear to auscultation bilaterally with normal respiratory effort. CV: Nondisplaced PMI.  Regular rate and rhythm, normal S1/S2, no S3/S4, soft 1/6 systolic murmur over right upper sternal border. No pretibial or periankle edema.  No carotid bruit.   Abdomen: Soft, nontender, obese.  Neurologic: Alert and oriented.  Psych: Normal affect. Skin: Normal. Musculoskeletal: No gross deformities.    ECG: Most recent ECG reviewed.   Labs: Lab Results  Component Value Date/Time   K 2.9 (L) 03/26/2016 09:28 AM   BUN 12 03/26/2016 09:28 AM   CREATININE 0.77 03/26/2016 09:28 AM  ALT 36 12/29/2015 11:55 PM   TSH 0.892 01/24/2015 06:48 PM   HGB 12.8 03/26/2016 09:28 AM     Lipids: Lab Results  Component Value Date/Time   LDLCALC 105 (H) 01/24/2015 05:23 AM   CHOL 190 01/24/2015 05:23 AM   TRIG 145 01/24/2015 05:23 AM   HDL 56 01/24/2015 05:23 AM       ASSESSMENT AND PLAN: 1. Cardiomyopathy: Left ventricular systolic function was normal in June 2016. Continue carvedilol and lisinopril.  2. Essential HTN: Normal on current therapy. No changes.  3. Hyperlipidemia: Continue Lipitor 10 mg.  4. Aortic stenosis: Stable. Mild in June 2016. I will monitor.  5. CVA: No longer on Plavix. Continue aspirin 81  mg and statin.    Disposition: Follow up 1 year  Prentice Docker, M.D., F.A.C.C.

## 2017-01-23 NOTE — Patient Instructions (Signed)
Your physician wants you to follow-up in: 1 year Dr Reggy Eye will receive a reminder letter in the mail two months in advance. If you don't receive a letter, please call our office to schedule the follow-up appointment.   Your physician recommends that you continue on your current medications as directed. Please refer to the Current Medication list given to you today.   If you need a refill on your cardiac medications before your next appointment, please call your pharmacy.   No lab work or testing today    Thank you for choosing [facility] !

## 2018-02-09 ENCOUNTER — Encounter: Payer: Self-pay | Admitting: Cardiovascular Disease

## 2018-02-09 ENCOUNTER — Ambulatory Visit (INDEPENDENT_AMBULATORY_CARE_PROVIDER_SITE_OTHER): Payer: Self-pay | Admitting: Cardiovascular Disease

## 2018-02-09 VITALS — BP 146/86 | HR 88 | Ht 62.0 in | Wt 239.0 lb

## 2018-02-09 DIAGNOSIS — I10 Essential (primary) hypertension: Secondary | ICD-10-CM

## 2018-02-09 DIAGNOSIS — I350 Nonrheumatic aortic (valve) stenosis: Secondary | ICD-10-CM

## 2018-02-09 DIAGNOSIS — Z8673 Personal history of transient ischemic attack (TIA), and cerebral infarction without residual deficits: Secondary | ICD-10-CM

## 2018-02-09 DIAGNOSIS — I429 Cardiomyopathy, unspecified: Secondary | ICD-10-CM

## 2018-02-09 DIAGNOSIS — E785 Hyperlipidemia, unspecified: Secondary | ICD-10-CM

## 2018-02-09 MED ORDER — LISINOPRIL 20 MG PO TABS: 20.0000 mg | ORAL_TABLET | Freq: Every day | ORAL | 3 refills | Status: DC

## 2018-02-09 NOTE — Progress Notes (Signed)
SUBJECTIVE: The patient presents for follow-up of cardiomyopathy.  Most recent echocardiogram performed on 01/24/15 showed normal left ventricular systolic function, EF 50-55%, mild to moderate LVH, grade 1 diastolic dysfunction, wall motion abnormalities, mild aortic stenosis, and mild mitral regurgitation.  I personally reviewed the ECG performed today which demonstrates sinus rhythm with diffuse nonspecific T wave abnormalities.  She denies exertional chest pain.  She has chronic exertional dyspnea which is stable.  She walks with a walker.  She has had some left hand weakness ever since her stroke.  She said she had an asthma attack about 3 months ago.  She has bilateral feet swelling.  She has occasional dry coughing upon awakening.  She denies palpitations, orthopnea, lightheadedness, dizziness, and syncope.  She denies any hospitalizations in the past 12 months.  When she gets her blood pressure checked at [facility], she said it is routinely elevated.      Review of Systems: As per "subjective", otherwise negative.  No Known Allergies  Current Outpatient Medications  Medication Sig Dispense Refill  . allopurinol (ZYLOPRIM) 100 MG tablet Take 100 mg by mouth daily.  2  . amLODipine (NORVASC) 10 MG tablet Take 10 mg by mouth daily.  3  . aspirin EC 81 MG tablet Take 81 mg by mouth daily.    Marland Kitchen atorvastatin (LIPITOR) 10 MG tablet Take 10 mg by mouth daily.    . carvedilol (COREG) 12.5 MG tablet Take 1 tablet (12.5 mg total) by mouth 2 (two) times daily. 180 tablet 3  . gabapentin (NEURONTIN) 300 MG capsule Take 300 mg by mouth 2 (two) times daily.    Marland Kitchen glipiZIDE (GLUCOTROL) 5 MG tablet Take 5 mg by mouth daily.  3  . lisinopril-hydrochlorothiazide (PRINZIDE,ZESTORETIC) 20-25 MG tablet Take 1 tablet by mouth daily.  3  . metFORMIN (GLUCOPHAGE) 1000 MG tablet Take 1 tablet (1,000 mg total) by mouth 2 (two) times daily with a meal. 60 tablet 2   No current  facility-administered medications for this visit.     Past Medical History:  Diagnosis Date  . Cholecystitis    lap chole 2006  . CVA (cerebral infarction)    03/2014  . Diabetes mellitus without complication (HCC)   . Dyslipidemia   . Gout   . Hypertension   . Obesity (BMI 30-39.9)   . Obstructive sleep apnea     Past Surgical History:  Procedure Laterality Date  . ABDOMINAL HYSTERECTOMY      Social History   Socioeconomic History  . Marital status: Single    Spouse name: Not on file  . Number of children: Not on file  . Years of education: Not on file  . Highest education level: Not on file  Occupational History  . Not on file  Social Needs  . Financial resource strain: Not on file  . Food insecurity:    Worry: Not on file    Inability: Not on file  . Transportation needs:    Medical: Not on file    Non-medical: Not on file  Tobacco Use  . Smoking status: Never Smoker  . Smokeless tobacco: Never Used  Substance and Sexual Activity  . Alcohol use: No    Alcohol/week: 0.0 oz  . Drug use: No  . Sexual activity: Not on file  Lifestyle  . Physical activity:    Days per week: Not on file    Minutes per session: Not on file  . Stress: Not on  file  Relationships  . Social connections:    Talks on phone: Not on file    Gets together: Not on file    Attends religious service: Not on file    Active member of club or organization: Not on file    Attends meetings of clubs or organizations: Not on file    Relationship status: Not on file  . Intimate partner violence:    Fear of current or ex partner: Not on file    Emotionally abused: Not on file    Physically abused: Not on file    Forced sexual activity: Not on file  Other Topics Concern  . Not on file  Social History Narrative  . Not on file     Vitals:   02/09/18 1040  BP: (!) 146/86  Pulse: 88  SpO2: 98%  Weight: 239 lb (108.4 kg)  Height: 5\' 2"  (1.575 m)    Wt Readings from Last 3 Encounters:   02/09/18 239 lb (108.4 kg)  01/23/17 237 lb (107.5 kg)  03/26/16 210 lb (95.3 kg)     PHYSICAL EXAM General: NAD HEENT: Normal. Neck: No JVD, no thyromegaly. Lungs: Clear to auscultation bilaterally with normal respiratory effort. CV: Regular rate and rhythm, normal S1/S2, no S3/S4, soft 1/6 systolic murmur over right upper sternal border. No pretibial or periankle edema.  No carotid bruit.   Abdomen: Soft, nontender, no distention.  Neurologic: Alert and oriented.  Left hand strength 4/5. Psych: Normal affect. Skin: Normal. Musculoskeletal: No gross deformities.    ECG: Most recent ECG reviewed.   Labs: Lab Results  Component Value Date/Time   K 2.9 (L) 03/26/2016 09:28 AM   BUN 12 03/26/2016 09:28 AM   CREATININE 0.77 03/26/2016 09:28 AM   ALT 36 12/29/2015 11:55 PM   TSH 0.892 01/24/2015 06:48 PM   HGB 12.8 03/26/2016 09:28 AM     Lipids: Lab Results  Component Value Date/Time   LDLCALC 105 (H) 01/24/2015 05:23 AM   CHOL 190 01/24/2015 05:23 AM   TRIG 145 01/24/2015 05:23 AM   HDL 56 01/24/2015 05:23 AM       ASSESSMENT AND PLAN:  1. Cardiomyopathy:  Left ventricular systolic function was normal in June 2016.  She is on carvedilol and lisinopril.  I will obtain a follow-up echocardiogram.  2. Essential HTN:  Blood pressure is elevated.  I will have her take an extra 20 mg of lisinopril every evening in addition to the Prinzide she takes every morning.  I also asked her to check her blood pressure in the next week or two and call me back with the reading.  3. Hyperlipidemia:  Continue Lipitor 10 mg.  4. Aortic stenosis:  Mild when last evaluated in June 2016.  I will obtain a follow-up echocardiogram.  5. CVA:  Continue aspirin 81 mg and atorvastatin 10 mg.     Disposition: Follow up 1 year   Prentice Docker, M.D., F.A.C.C.

## 2018-02-09 NOTE — Patient Instructions (Signed)
Your physician wants you to follow-up in:  1 year with Dr.Koneswaran You will receive a reminder letter in the mail two months in advance. If you don't receive a letter, please call our office to schedule the follow-up appointment.     Take Prinzide in the am   Take Lisinopril 20 mg at bedtime    In 1 week, call us with your BP from your pcp office    Your physician has requested that you have an echocardiogram. Echocardiography is a painless test that uses sound waves to create images of your heart. It provides your doctor with information about the size and shape of your heart and how well your heart's chambers and valves are working. This procedure takes approximately one hour. There are no restrictions for this procedure.        If you need a refill on your cardiac medications before your next appointment, please call your pharmacy.   Thank you for choosing [facility] !

## 2018-02-12 ENCOUNTER — Ambulatory Visit (HOSPITAL_COMMUNITY)
Admission: RE | Admit: 2018-02-12 | Discharge: 2018-02-12 | Disposition: A | Discharge status: Home / Self Care | Payer: Medicaid Other | Source: Ambulatory Visit | Attending: Cardiovascular Disease | Admitting: Cardiovascular Disease

## 2018-02-12 DIAGNOSIS — I10 Essential (primary) hypertension: Secondary | ICD-10-CM | POA: Insufficient documentation

## 2018-02-12 DIAGNOSIS — I69954 Hemiplegia and hemiparesis following unspecified cerebrovascular disease affecting left non-dominant side: Secondary | ICD-10-CM | POA: Insufficient documentation

## 2018-02-12 DIAGNOSIS — E119 Type 2 diabetes mellitus without complications: Secondary | ICD-10-CM | POA: Insufficient documentation

## 2018-02-12 DIAGNOSIS — I350 Nonrheumatic aortic (valve) stenosis: Secondary | ICD-10-CM | POA: Diagnosis present

## 2018-02-12 DIAGNOSIS — E785 Hyperlipidemia, unspecified: Secondary | ICD-10-CM | POA: Insufficient documentation

## 2018-02-12 NOTE — Progress Notes (Signed)
*  PRELIMINARY RESULTS* Echocardiogram 2D Echocardiogram has been performed.  Crystal Snow 02/12/2018, 9:32 AM

## 2018-02-13 ENCOUNTER — Telehealth: Payer: Self-pay | Admitting: *Deleted

## 2018-02-13 NOTE — Telephone Encounter (Signed)
-----   Message from Laqueta Linden, MD sent at 02/12/2018  3:19 PM EDT ----- Normal pumping function.

## 2018-02-13 NOTE — Telephone Encounter (Signed)
Called patient with test results. No answer. Left message to call back.  

## 2018-06-09 DIAGNOSIS — H5213 Myopia, bilateral: Secondary | ICD-10-CM | POA: Diagnosis not present

## 2018-06-09 DIAGNOSIS — E11319 Type 2 diabetes mellitus with unspecified diabetic retinopathy without macular edema: Secondary | ICD-10-CM | POA: Diagnosis not present

## 2018-06-18 DIAGNOSIS — I10 Essential (primary) hypertension: Secondary | ICD-10-CM | POA: Diagnosis not present

## 2018-06-18 DIAGNOSIS — Z794 Long term (current) use of insulin: Secondary | ICD-10-CM | POA: Diagnosis not present

## 2018-06-18 DIAGNOSIS — E119 Type 2 diabetes mellitus without complications: Secondary | ICD-10-CM | POA: Diagnosis not present

## 2018-06-18 DIAGNOSIS — Z8673 Personal history of transient ischemic attack (TIA), and cerebral infarction without residual deficits: Secondary | ICD-10-CM | POA: Diagnosis not present

## 2018-06-18 DIAGNOSIS — Z23 Encounter for immunization: Secondary | ICD-10-CM | POA: Diagnosis not present

## 2018-06-25 DIAGNOSIS — H524 Presbyopia: Secondary | ICD-10-CM | POA: Diagnosis not present

## 2018-07-01 ENCOUNTER — Other Ambulatory Visit (HOSPITAL_COMMUNITY): Payer: Self-pay | Admitting: Family Medicine

## 2018-07-01 DIAGNOSIS — Z1231 Encounter for screening mammogram for malignant neoplasm of breast: Secondary | ICD-10-CM

## 2018-07-10 ENCOUNTER — Ambulatory Visit (HOSPITAL_COMMUNITY)
Admission: RE | Admit: 2018-07-10 | Discharge: 2018-07-10 | Disposition: A | Discharge status: Home / Self Care | Payer: Medicare Other | Source: Ambulatory Visit | Attending: Family Medicine | Admitting: Family Medicine

## 2018-07-10 DIAGNOSIS — Z1231 Encounter for screening mammogram for malignant neoplasm of breast: Secondary | ICD-10-CM | POA: Diagnosis not present

## 2018-09-03 DIAGNOSIS — E119 Type 2 diabetes mellitus without complications: Secondary | ICD-10-CM | POA: Diagnosis not present

## 2018-09-03 DIAGNOSIS — Z8673 Personal history of transient ischemic attack (TIA), and cerebral infarction without residual deficits: Secondary | ICD-10-CM | POA: Diagnosis not present

## 2018-09-03 DIAGNOSIS — R011 Cardiac murmur, unspecified: Secondary | ICD-10-CM | POA: Diagnosis not present

## 2018-09-03 DIAGNOSIS — M1A09X0 Idiopathic chronic gout, multiple sites, without tophus (tophi): Secondary | ICD-10-CM | POA: Diagnosis not present

## 2018-09-03 DIAGNOSIS — I10 Essential (primary) hypertension: Secondary | ICD-10-CM | POA: Diagnosis not present

## 2018-10-27 DIAGNOSIS — R809 Proteinuria, unspecified: Secondary | ICD-10-CM | POA: Diagnosis not present

## 2018-10-27 DIAGNOSIS — I10 Essential (primary) hypertension: Secondary | ICD-10-CM | POA: Diagnosis not present

## 2018-10-27 DIAGNOSIS — E559 Vitamin D deficiency, unspecified: Secondary | ICD-10-CM | POA: Diagnosis not present

## 2018-10-27 DIAGNOSIS — E1129 Type 2 diabetes mellitus with other diabetic kidney complication: Secondary | ICD-10-CM | POA: Diagnosis not present

## 2018-10-27 DIAGNOSIS — M109 Gout, unspecified: Secondary | ICD-10-CM | POA: Diagnosis not present

## 2018-10-29 ENCOUNTER — Other Ambulatory Visit: Payer: Self-pay | Admitting: Nephrology

## 2018-10-29 DIAGNOSIS — R809 Proteinuria, unspecified: Secondary | ICD-10-CM

## 2018-10-29 DIAGNOSIS — I10 Essential (primary) hypertension: Secondary | ICD-10-CM

## 2018-11-24 DIAGNOSIS — E119 Type 2 diabetes mellitus without complications: Secondary | ICD-10-CM | POA: Diagnosis not present

## 2018-11-24 DIAGNOSIS — M1A09X0 Idiopathic chronic gout, multiple sites, without tophus (tophi): Secondary | ICD-10-CM | POA: Diagnosis not present

## 2018-11-24 DIAGNOSIS — I10 Essential (primary) hypertension: Secondary | ICD-10-CM | POA: Diagnosis not present

## 2018-11-24 DIAGNOSIS — Z8673 Personal history of transient ischemic attack (TIA), and cerebral infarction without residual deficits: Secondary | ICD-10-CM | POA: Diagnosis not present

## 2018-12-04 ENCOUNTER — Ambulatory Visit (HOSPITAL_COMMUNITY): Payer: Medicare Other

## 2018-12-04 ENCOUNTER — Encounter (HOSPITAL_COMMUNITY): Payer: Self-pay

## 2019-01-19 DIAGNOSIS — W57XXXA Bitten or stung by nonvenomous insect and other nonvenomous arthropods, initial encounter: Secondary | ICD-10-CM | POA: Diagnosis not present

## 2019-01-19 DIAGNOSIS — I10 Essential (primary) hypertension: Secondary | ICD-10-CM | POA: Diagnosis not present

## 2019-01-19 DIAGNOSIS — S40861A Insect bite (nonvenomous) of right upper arm, initial encounter: Secondary | ICD-10-CM | POA: Diagnosis not present

## 2019-01-19 DIAGNOSIS — E119 Type 2 diabetes mellitus without complications: Secondary | ICD-10-CM | POA: Diagnosis not present

## 2019-02-02 DIAGNOSIS — Z8673 Personal history of transient ischemic attack (TIA), and cerebral infarction without residual deficits: Secondary | ICD-10-CM | POA: Diagnosis not present

## 2019-02-02 DIAGNOSIS — J189 Pneumonia, unspecified organism: Secondary | ICD-10-CM | POA: Diagnosis not present

## 2019-02-02 DIAGNOSIS — R05 Cough: Secondary | ICD-10-CM | POA: Diagnosis not present

## 2019-02-02 DIAGNOSIS — I10 Essential (primary) hypertension: Secondary | ICD-10-CM | POA: Diagnosis not present

## 2019-02-02 DIAGNOSIS — M791 Myalgia, unspecified site: Secondary | ICD-10-CM | POA: Diagnosis not present

## 2019-02-02 DIAGNOSIS — E1165 Type 2 diabetes mellitus with hyperglycemia: Secondary | ICD-10-CM | POA: Diagnosis not present

## 2019-04-20 ENCOUNTER — Encounter (INDEPENDENT_AMBULATORY_CARE_PROVIDER_SITE_OTHER): Payer: Self-pay | Admitting: *Deleted

## 2019-04-20 DIAGNOSIS — Z8673 Personal history of transient ischemic attack (TIA), and cerebral infarction without residual deficits: Secondary | ICD-10-CM | POA: Diagnosis not present

## 2019-04-20 DIAGNOSIS — M1A09X0 Idiopathic chronic gout, multiple sites, without tophus (tophi): Secondary | ICD-10-CM | POA: Diagnosis not present

## 2019-04-20 DIAGNOSIS — E119 Type 2 diabetes mellitus without complications: Secondary | ICD-10-CM | POA: Diagnosis not present

## 2019-04-20 DIAGNOSIS — I10 Essential (primary) hypertension: Secondary | ICD-10-CM | POA: Diagnosis not present

## 2019-05-25 DIAGNOSIS — E119 Type 2 diabetes mellitus without complications: Secondary | ICD-10-CM | POA: Diagnosis not present

## 2019-05-25 DIAGNOSIS — M10379 Gout due to renal impairment, unspecified ankle and foot: Secondary | ICD-10-CM | POA: Diagnosis not present

## 2019-05-25 DIAGNOSIS — N2581 Secondary hyperparathyroidism of renal origin: Secondary | ICD-10-CM | POA: Diagnosis not present

## 2019-05-25 DIAGNOSIS — E1129 Type 2 diabetes mellitus with other diabetic kidney complication: Secondary | ICD-10-CM | POA: Diagnosis not present

## 2019-05-25 DIAGNOSIS — R809 Proteinuria, unspecified: Secondary | ICD-10-CM | POA: Diagnosis not present

## 2019-05-25 DIAGNOSIS — I10 Essential (primary) hypertension: Secondary | ICD-10-CM | POA: Diagnosis not present

## 2019-05-25 DIAGNOSIS — M1A09X0 Idiopathic chronic gout, multiple sites, without tophus (tophi): Secondary | ICD-10-CM | POA: Diagnosis not present

## 2019-05-25 DIAGNOSIS — E782 Mixed hyperlipidemia: Secondary | ICD-10-CM | POA: Diagnosis not present

## 2019-05-25 DIAGNOSIS — Z23 Encounter for immunization: Secondary | ICD-10-CM | POA: Diagnosis not present

## 2019-05-25 DIAGNOSIS — E559 Vitamin D deficiency, unspecified: Secondary | ICD-10-CM | POA: Diagnosis not present

## 2019-06-23 DIAGNOSIS — Z8673 Personal history of transient ischemic attack (TIA), and cerebral infarction without residual deficits: Secondary | ICD-10-CM | POA: Diagnosis not present

## 2019-06-23 DIAGNOSIS — E119 Type 2 diabetes mellitus without complications: Secondary | ICD-10-CM | POA: Diagnosis not present

## 2019-06-23 DIAGNOSIS — I10 Essential (primary) hypertension: Secondary | ICD-10-CM | POA: Diagnosis not present

## 2019-06-23 DIAGNOSIS — M1A09X0 Idiopathic chronic gout, multiple sites, without tophus (tophi): Secondary | ICD-10-CM | POA: Diagnosis not present

## 2019-07-06 IMAGING — MG DIGITAL SCREENING BILATERAL MAMMOGRAM WITH TOMO AND CAD
6 of 10 series · 6 of 30 positions shown · non-contrast
Comparison: Previous exam(s).

CLINICAL DATA: Screening.

EXAM:
DIGITAL SCREENING BILATERAL MAMMOGRAM WITH TOMO AND CAD

[R MLO synth-2D]
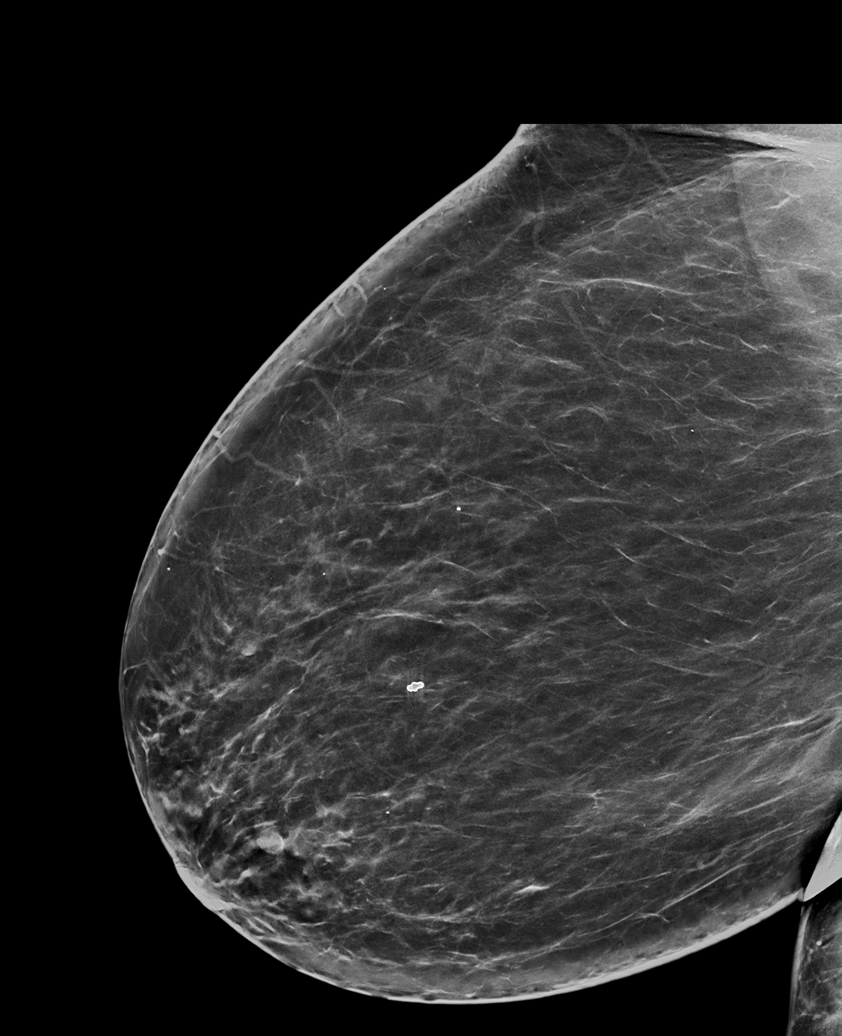

[L CV synth-2D]
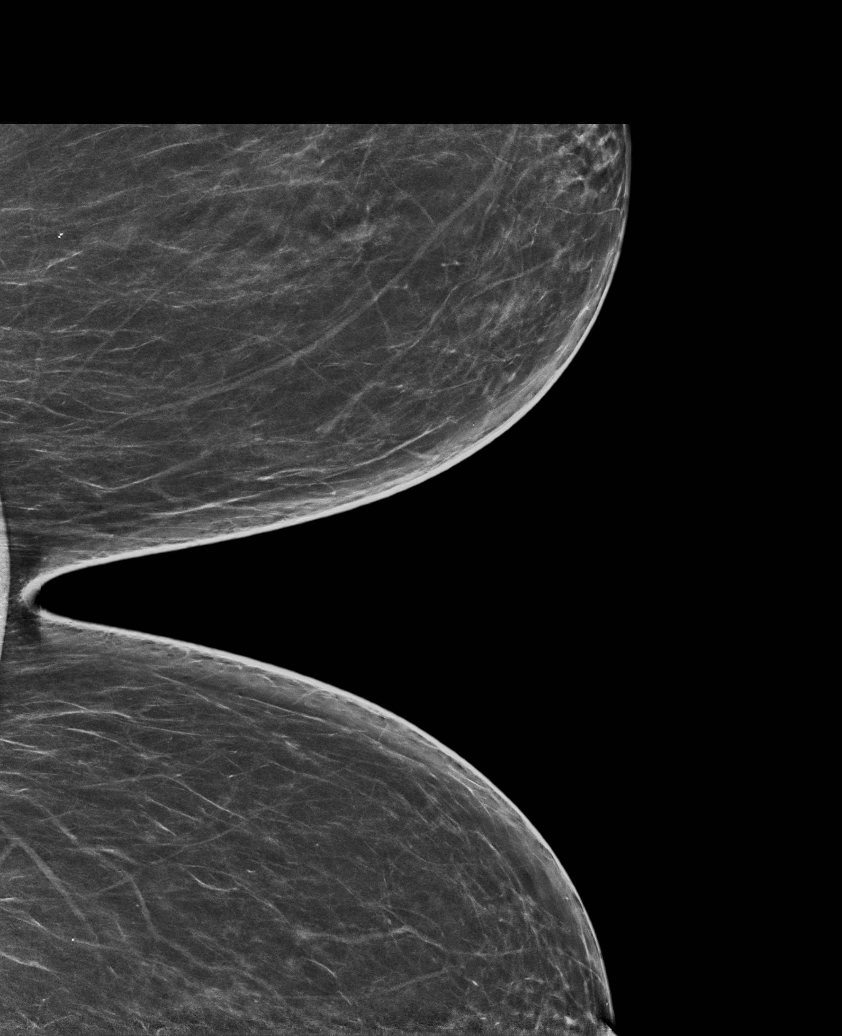

[L CC synth-2D]
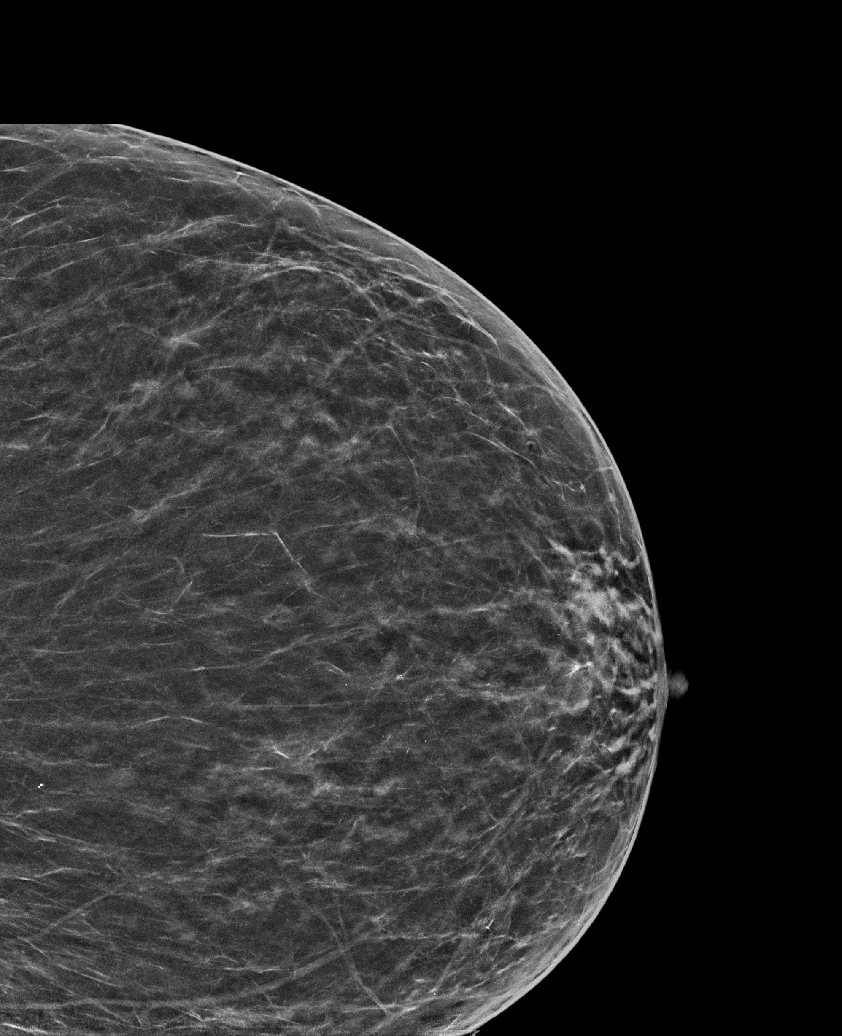

[L MLO synth-2D]
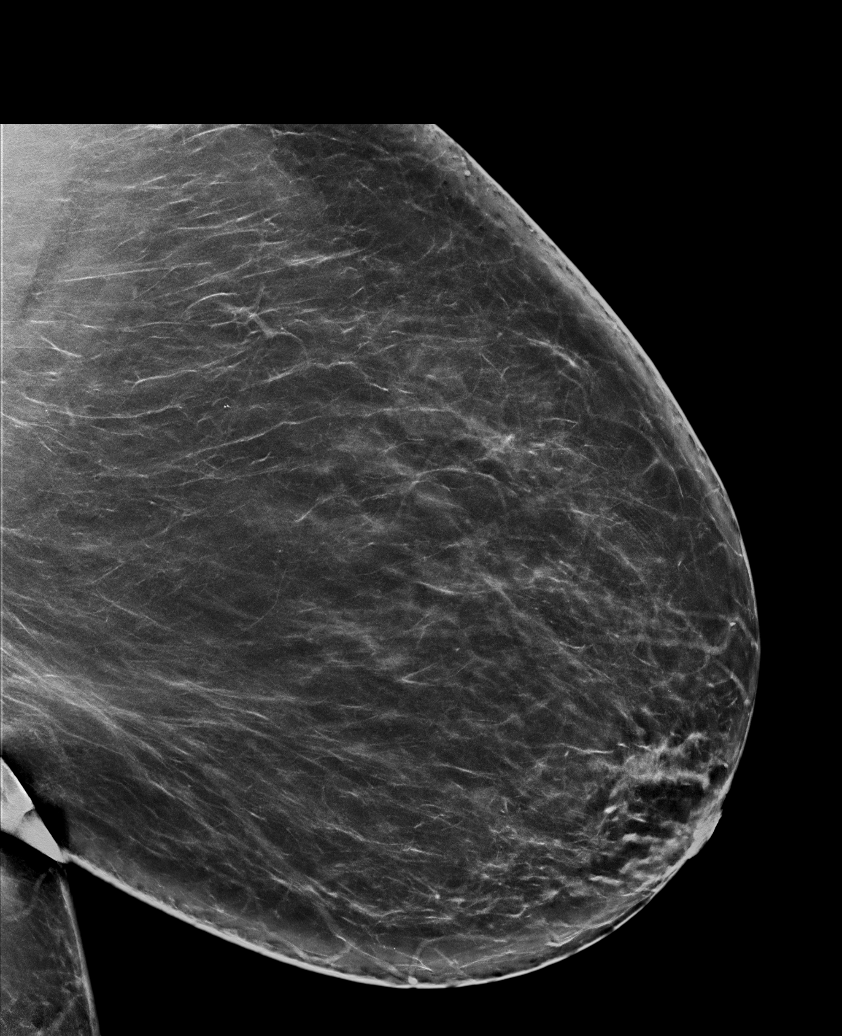

[R CC synth-2D]
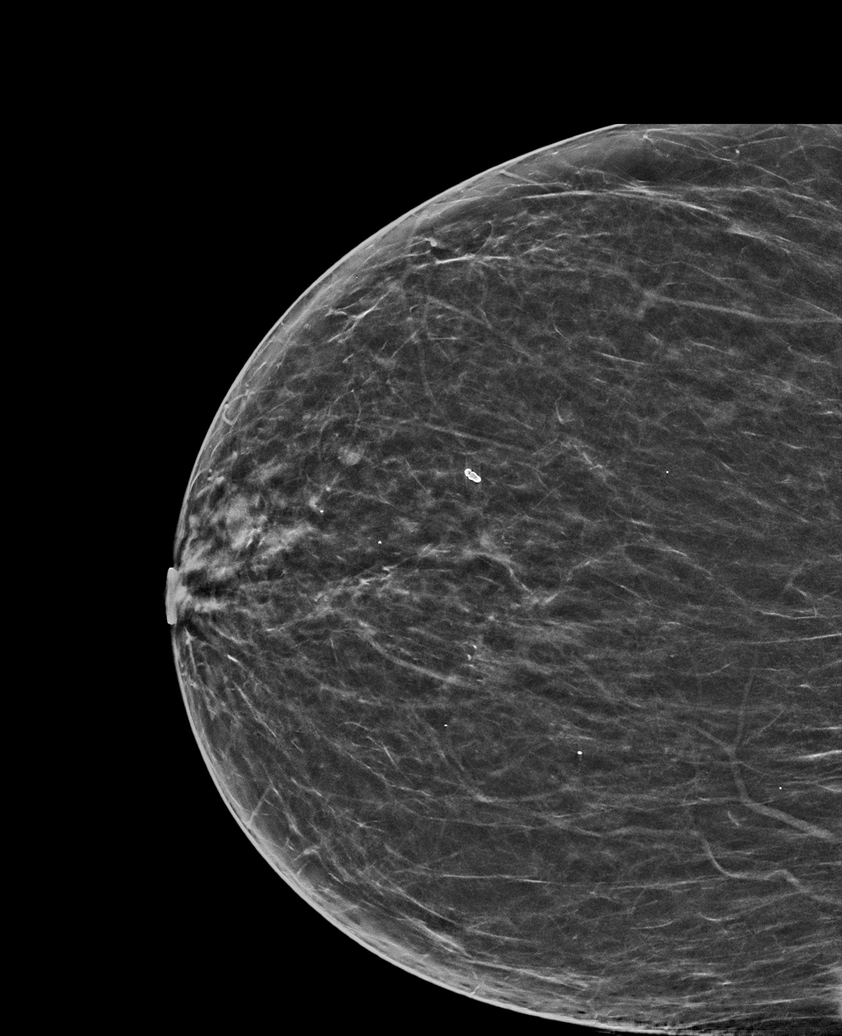

[L MLO tomo · tomo slice 46/91.0]
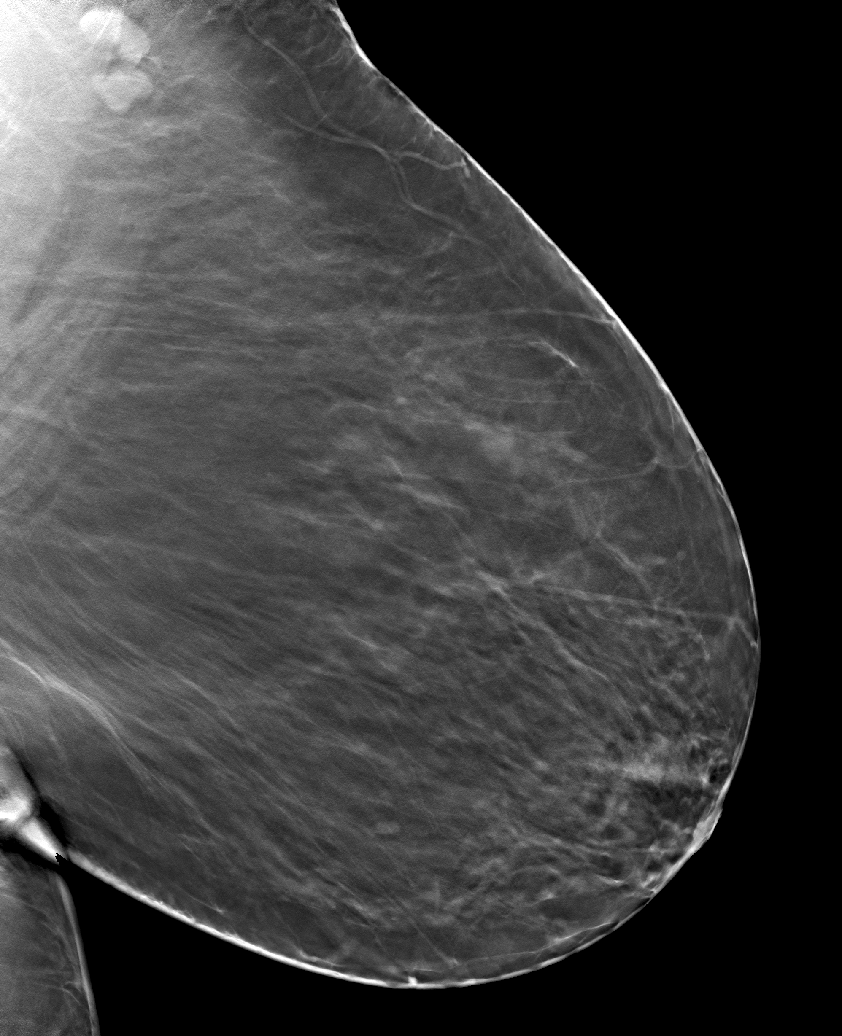

[6 of 30 positions shown; findings below may reference images not displayed]

ACR Breast Density Category b: There are scattered areas of
fibroglandular density.
FINDINGS: There are no findings suspicious for malignancy. Images were
processed with CAD.
IMPRESSION: No mammographic evidence of malignancy. A result letter of this
screening mammogram will be mailed directly to the patient.

RECOMMENDATION:
Screening mammogram in one year. (Code:[id number])

BI-RADS CATEGORY  1: Negative.

## 2019-09-24 ENCOUNTER — Other Ambulatory Visit: Payer: Self-pay

## 2019-09-24 NOTE — Patient Outreach (Signed)
[facility]) Care Management  09/24/2019  Crystal Snow 12/26/1960 [id number]   Medication Adherence call to Crystal Snow patient has a disconnected number. Crystal Snow is showing past due on Jardiance 10 mg under [facility] Ins.   Lillia Abed CPhT Pharmacy Technician [facility] Care Management Direct Dial [phone]  Fax [phone] [email or web address]

## 2020-01-04 ENCOUNTER — Encounter (INDEPENDENT_AMBULATORY_CARE_PROVIDER_SITE_OTHER): Payer: Self-pay | Admitting: *Deleted

## 2020-03-09 ENCOUNTER — Other Ambulatory Visit (HOSPITAL_COMMUNITY): Payer: Self-pay | Admitting: Family Medicine

## 2020-03-09 DIAGNOSIS — N2581 Secondary hyperparathyroidism of renal origin: Secondary | ICD-10-CM | POA: Diagnosis not present

## 2020-03-09 DIAGNOSIS — E119 Type 2 diabetes mellitus without complications: Secondary | ICD-10-CM | POA: Diagnosis not present

## 2020-03-09 DIAGNOSIS — I10 Essential (primary) hypertension: Secondary | ICD-10-CM | POA: Diagnosis not present

## 2020-03-09 DIAGNOSIS — I5032 Chronic diastolic (congestive) heart failure: Secondary | ICD-10-CM | POA: Diagnosis not present

## 2020-03-09 DIAGNOSIS — Z1231 Encounter for screening mammogram for malignant neoplasm of breast: Secondary | ICD-10-CM

## 2020-03-09 DIAGNOSIS — E782 Mixed hyperlipidemia: Secondary | ICD-10-CM | POA: Diagnosis not present

## 2020-03-09 DIAGNOSIS — M1A09X0 Idiopathic chronic gout, multiple sites, without tophus (tophi): Secondary | ICD-10-CM | POA: Diagnosis not present

## 2020-03-09 DIAGNOSIS — G4700 Insomnia, unspecified: Secondary | ICD-10-CM | POA: Diagnosis not present

## 2020-03-09 DIAGNOSIS — E559 Vitamin D deficiency, unspecified: Secondary | ICD-10-CM | POA: Diagnosis not present

## 2020-03-13 ENCOUNTER — Ambulatory Visit (HOSPITAL_COMMUNITY)
Admission: RE | Admit: 2020-03-13 | Discharge: 2020-03-13 | Disposition: A | Discharge status: Home / Self Care | Payer: Medicare Other | Source: Ambulatory Visit | Attending: Family Medicine | Admitting: Family Medicine

## 2020-03-13 ENCOUNTER — Other Ambulatory Visit: Payer: Self-pay

## 2020-03-13 DIAGNOSIS — Z1231 Encounter for screening mammogram for malignant neoplasm of breast: Secondary | ICD-10-CM | POA: Diagnosis not present

## 2020-05-19 ENCOUNTER — Other Ambulatory Visit (INDEPENDENT_AMBULATORY_CARE_PROVIDER_SITE_OTHER): Payer: Self-pay

## 2020-05-19 DIAGNOSIS — Z800 Family history of malignant neoplasm of digestive organs: Secondary | ICD-10-CM

## 2020-06-22 DIAGNOSIS — Z23 Encounter for immunization: Secondary | ICD-10-CM | POA: Diagnosis not present

## 2020-06-22 DIAGNOSIS — E119 Type 2 diabetes mellitus without complications: Secondary | ICD-10-CM | POA: Diagnosis not present

## 2020-06-22 DIAGNOSIS — E559 Vitamin D deficiency, unspecified: Secondary | ICD-10-CM | POA: Diagnosis not present

## 2020-06-22 DIAGNOSIS — M1A09X0 Idiopathic chronic gout, multiple sites, without tophus (tophi): Secondary | ICD-10-CM | POA: Diagnosis not present

## 2020-06-22 DIAGNOSIS — M79642 Pain in left hand: Secondary | ICD-10-CM | POA: Diagnosis not present

## 2020-06-22 DIAGNOSIS — I10 Essential (primary) hypertension: Secondary | ICD-10-CM | POA: Diagnosis not present

## 2020-06-22 DIAGNOSIS — N182 Chronic kidney disease, stage 2 (mild): Secondary | ICD-10-CM | POA: Diagnosis not present

## 2020-06-22 DIAGNOSIS — M25562 Pain in left knee: Secondary | ICD-10-CM | POA: Diagnosis not present

## 2020-08-03 DIAGNOSIS — I10 Essential (primary) hypertension: Secondary | ICD-10-CM | POA: Diagnosis not present

## 2020-08-03 DIAGNOSIS — E119 Type 2 diabetes mellitus without complications: Secondary | ICD-10-CM | POA: Diagnosis not present

## 2020-08-03 DIAGNOSIS — I5032 Chronic diastolic (congestive) heart failure: Secondary | ICD-10-CM | POA: Diagnosis not present

## 2020-08-03 DIAGNOSIS — M1A09X0 Idiopathic chronic gout, multiple sites, without tophus (tophi): Secondary | ICD-10-CM | POA: Diagnosis not present

## 2020-08-03 DIAGNOSIS — M25562 Pain in left knee: Secondary | ICD-10-CM | POA: Diagnosis not present

## 2020-08-19 HISTORY — PX: BREAST BIOPSY: SHX20

## 2020-11-09 DIAGNOSIS — M1A09X0 Idiopathic chronic gout, multiple sites, without tophus (tophi): Secondary | ICD-10-CM | POA: Diagnosis not present

## 2020-11-09 DIAGNOSIS — M25562 Pain in left knee: Secondary | ICD-10-CM | POA: Diagnosis not present

## 2020-11-09 DIAGNOSIS — R809 Proteinuria, unspecified: Secondary | ICD-10-CM | POA: Diagnosis not present

## 2020-11-09 DIAGNOSIS — E119 Type 2 diabetes mellitus without complications: Secondary | ICD-10-CM | POA: Diagnosis not present

## 2020-11-09 DIAGNOSIS — E782 Mixed hyperlipidemia: Secondary | ICD-10-CM | POA: Diagnosis not present

## 2020-11-09 DIAGNOSIS — G4700 Insomnia, unspecified: Secondary | ICD-10-CM | POA: Diagnosis not present

## 2020-11-09 DIAGNOSIS — T753XXA Motion sickness, initial encounter: Secondary | ICD-10-CM | POA: Diagnosis not present

## 2020-11-09 DIAGNOSIS — I10 Essential (primary) hypertension: Secondary | ICD-10-CM | POA: Diagnosis not present

## 2020-11-09 DIAGNOSIS — I5032 Chronic diastolic (congestive) heart failure: Secondary | ICD-10-CM | POA: Diagnosis not present

## 2020-12-18 ENCOUNTER — Ambulatory Visit
Admission: EM | Admit: 2020-12-18 | Discharge: 2020-12-18 | Disposition: A | Discharge status: Home / Self Care | Payer: Medicare Other | Attending: Family Medicine | Admitting: Family Medicine

## 2020-12-18 ENCOUNTER — Other Ambulatory Visit: Payer: Self-pay

## 2020-12-18 DIAGNOSIS — Z20822 Contact with and (suspected) exposure to covid-19: Secondary | ICD-10-CM

## 2020-12-19 LAB — NOVEL CORONAVIRUS, NAA: SARS-CoV-2, NAA: NOT DETECTED

## 2021-01-30 DIAGNOSIS — E860 Dehydration: Secondary | ICD-10-CM | POA: Diagnosis not present

## 2021-01-30 DIAGNOSIS — I440 Atrioventricular block, first degree: Secondary | ICD-10-CM | POA: Diagnosis not present

## 2021-01-30 DIAGNOSIS — R4020 Unspecified coma: Secondary | ICD-10-CM | POA: Diagnosis not present

## 2021-01-30 DIAGNOSIS — R9431 Abnormal electrocardiogram [ECG] [EKG]: Secondary | ICD-10-CM | POA: Diagnosis not present

## 2021-01-30 DIAGNOSIS — R42 Dizziness and giddiness: Secondary | ICD-10-CM | POA: Diagnosis not present

## 2021-02-07 DIAGNOSIS — E782 Mixed hyperlipidemia: Secondary | ICD-10-CM | POA: Diagnosis not present

## 2021-02-07 DIAGNOSIS — Z1239 Encounter for other screening for malignant neoplasm of breast: Secondary | ICD-10-CM | POA: Diagnosis not present

## 2021-02-07 DIAGNOSIS — M25562 Pain in left knee: Secondary | ICD-10-CM | POA: Diagnosis not present

## 2021-02-07 DIAGNOSIS — E119 Type 2 diabetes mellitus without complications: Secondary | ICD-10-CM | POA: Diagnosis not present

## 2021-02-07 DIAGNOSIS — N189 Chronic kidney disease, unspecified: Secondary | ICD-10-CM | POA: Diagnosis not present

## 2021-02-07 DIAGNOSIS — M1A09X0 Idiopathic chronic gout, multiple sites, without tophus (tophi): Secondary | ICD-10-CM | POA: Diagnosis not present

## 2021-02-07 DIAGNOSIS — I10 Essential (primary) hypertension: Secondary | ICD-10-CM | POA: Diagnosis not present

## 2021-02-07 DIAGNOSIS — I5032 Chronic diastolic (congestive) heart failure: Secondary | ICD-10-CM | POA: Diagnosis not present

## 2021-02-07 DIAGNOSIS — R809 Proteinuria, unspecified: Secondary | ICD-10-CM | POA: Diagnosis not present

## 2021-02-08 ENCOUNTER — Other Ambulatory Visit (HOSPITAL_COMMUNITY): Payer: Self-pay | Admitting: Family Medicine

## 2021-02-08 DIAGNOSIS — Z1231 Encounter for screening mammogram for malignant neoplasm of breast: Secondary | ICD-10-CM

## 2021-02-13 ENCOUNTER — Other Ambulatory Visit (INDEPENDENT_AMBULATORY_CARE_PROVIDER_SITE_OTHER): Payer: Self-pay

## 2021-02-13 ENCOUNTER — Telehealth (INDEPENDENT_AMBULATORY_CARE_PROVIDER_SITE_OTHER): Payer: Self-pay

## 2021-02-13 ENCOUNTER — Encounter (INDEPENDENT_AMBULATORY_CARE_PROVIDER_SITE_OTHER): Payer: Self-pay | Admitting: *Deleted

## 2021-02-13 ENCOUNTER — Encounter (INDEPENDENT_AMBULATORY_CARE_PROVIDER_SITE_OTHER): Payer: Self-pay

## 2021-02-13 DIAGNOSIS — Z1211 Encounter for screening for malignant neoplasm of colon: Secondary | ICD-10-CM

## 2021-02-13 MED ORDER — PEG 3350-KCL-NA BICARB-NACL 420 G PO SOLR: 4000.0000 mL | ORAL | 0 refills | Status: DC

## 2021-02-13 NOTE — Telephone Encounter (Signed)
LeighAnn Takima [place], CMA  

## 2021-02-13 NOTE — Telephone Encounter (Signed)
Referring MD/PCP: Zhou-Talbert  Procedure: Tcs w/Propofol  Reason/Indication:  Screening  Has patient had this procedure before?  no  If so, when, by whom and where?    Is there a family history of colon cancer?  no  Who?  What age when diagnosed?    Is patient diabetic? If yes, Type 1 or Type 2   yes Type 2      Does patient have prosthetic heart valve or mechanical valve?  no  Do you have a pacemaker/defibrillator?  no  Has patient ever had endocarditis/atrial fibrillation? no  Does patient use oxygen? no  Has patient had joint replacement within last 12 months?  no  Is patient constipated or do they take laxatives? no  Does patient have a history of alcohol/drug use?  no  Have you had a stroke/heart attack last 6 mths? no  Do you take medicine for weight loss?  no  For female patients,: do you still have your menstrual cycle? no  Is patient on blood thinner such as Coumadin, Plavix and/or Aspirin? yes  Medications: Amlodipine 100mg  daily, lisinopril 40mg  daily, gabapentin 300 mg bid, glipizide 10 mg bid, atorvastatin 80 mg daily, duloxetine 30 mg daily, allopurinol 100 mg daily, metformin 1000 mg bid, spironolactone 25 mg daily, jardiance 25 mg daily, carvedilol 25 mg bid, asa 81 mg daily  Allergies: nkda   Medication Adjustment per Dr no Metformin or Jardiance the evening prior or the morning of procedure, Hold Aspirin 2 days prior  Procedure date & time: 03/07/21 10:55

## 2021-03-01 NOTE — Patient Instructions (Signed)
Crystal Snow  03/01/2021     @PREFPERIOPPHARMACY @   Your procedure is scheduled on 03/07/2021.   Report to 03/09/2021 at   0920 A.M.   Call this number if you have problems the morning of surgery:  [phone]   Remember:  Follow the diet and prep instructions given to you by the office.  DO NOT take any medications for diabetes the morning of your procedure.    Take these medicines the morning of surgery with A SIP OF WATER           allopurinol, amlodipine, carvedilol, cymbalta, gabapentin, hydroxyzine.     Do not wear jewelry, make-up or nail polish.  Do not wear lotions, powders, or perfumes, or deodorant.  Do not shave 48 hours prior to surgery.  Men may shave face and neck.  Do not bring valuables to the hospital.  [facility] is not responsible for any belongings or valuables.  Contacts, dentures or bridgework may not be worn into surgery.  Leave your suitcase in the car.  After surgery it may be brought to your room.  For patients admitted to the hospital, discharge time will be determined by your treatment team.  Patients discharged the day of surgery will not be allowed to drive home and must have someone with them for 24 hours.    Special instructions:     DO NOT smoke tobacco or vape for 24 hours before your procedure.  Please read over the following fact sheets that you were given. Anesthesia Post-op Instructions and Care and Recovery After Surgery      Colonoscopy, Adult, Care After This sheet gives you information about how to care for yourself after your procedure. Your health care provider may also give you more specific instructions. If you have problems or questions, contact your health careprovider. What can I expect after the procedure? After the procedure, it is common to have: A small amount of blood in your stool for 24 hours after the procedure. Some gas. Mild cramping or bloating of your abdomen. Follow these instructions  at home: Eating and drinking  Drink enough fluid to keep your urine pale yellow. Follow instructions from your health care provider about eating or drinking restrictions. Resume your normal diet as instructed by your health care provider. Avoid heavy or fried foods that are hard to digest.  Activity Rest as told by your health care provider. Avoid sitting for a long time without moving. Get up to take short walks every 1-2 hours. This is important to improve blood flow and breathing. Ask for help if you feel weak or unsteady. Return to your normal activities as told by your health care provider. Ask your health care provider what activities are safe for you. Managing cramping and bloating  Try walking around when you have cramps or feel bloated. Apply heat to your abdomen as told by your health care provider. Use the heat source that your health care provider recommends, such as a moist heat pack or a heating pad. Place a towel between your skin and the heat source. Leave the heat on for 20-30 minutes. Remove the heat if your skin turns bright red. This is especially important if you are unable to feel pain, heat, or cold. You may have a greater risk of getting burned.  General instructions If you were given a sedative during the procedure, it can affect you for several hours. Do not drive or operate machinery until  your health care provider says that it is safe. For the first 24 hours after the procedure: Do not sign important documents. Do not drink alcohol. Do your regular daily activities at a slower pace than normal. Eat soft foods that are easy to digest. Take over-the-counter and prescription medicines only as told by your health care provider. Keep all follow-up visits as told by your health care provider. This is important. Contact a health care provider if: You have blood in your stool 2-3 days after the procedure. Get help right away if you have: More than a small spotting  of blood in your stool. Large blood clots in your stool. Swelling of your abdomen. Nausea or vomiting. A fever. Increasing pain in your abdomen that is not relieved with medicine. Summary After the procedure, it is common to have a small amount of blood in your stool. You may also have mild cramping and bloating of your abdomen. If you were given a sedative during the procedure, it can affect you for several hours. Do not drive or operate machinery until your health care provider says that it is safe. Get help right away if you have a lot of blood in your stool, nausea or vomiting, a fever, or increased pain in your abdomen. This information is not intended to replace advice given to you by your health care provider. Make sure you discuss any questions you have with your healthcare provider. Document Revised: 07/30/2019 Document Reviewed: 03/01/2019 Elsevier Patient Education  2022 [employer] Monitored Anesthesia Care, Care After This sheet gives you information about how to care for yourself after your procedure. Your health care provider may also give you more specific instructions. If you have problems or questions, contact your health careprovider. What can I expect after the procedure? After the procedure, it is common to have: Tiredness. Forgetfulness about what happened after the procedure. Impaired judgment for important decisions. Nausea or vomiting. Some difficulty with balance. Follow these instructions at home: For the time period you were told by your health care provider:     Rest as needed. Do not participate in activities where you could fall or become injured. Do not drive or use machinery. Do not drink alcohol. Do not take sleeping pills or medicines that cause drowsiness. Do not make important decisions or sign legal documents. Do not take care of children on your own. Eating and drinking Follow the diet that is recommended by your health care  provider. Drink enough fluid to keep your urine pale yellow. If you vomit: Drink water, juice, or soup when you can drink without vomiting. Make sure you have little or no nausea before eating solid foods. General instructions Have a responsible adult stay with you for the time you are told. It is important to have someone help care for you until you are awake and alert. Take over-the-counter and prescription medicines only as told by your health care provider. If you have sleep apnea, surgery and certain medicines can increase your risk for breathing problems. Follow instructions from your health care provider about wearing your sleep device: Anytime you are sleeping, including during daytime naps. While taking prescription pain medicines, sleeping medicines, or medicines that make you drowsy. Avoid smoking. Keep all follow-up visits as told by your health care provider. This is important. Contact a health care provider if: You keep feeling nauseous or you keep vomiting. You feel light-headed. You are still sleepy or having trouble with balance after 24 hours. You develop a rash.  You have a fever. You have redness or swelling around the IV site. Get help right away if: You have trouble breathing. You have new-onset confusion at home. Summary For several hours after your procedure, you may feel tired. You may also be forgetful and have poor judgment. Have a responsible adult stay with you for the time you are told. It is important to have someone help care for you until you are awake and alert. Rest as told. Do not drive or operate machinery. Do not drink alcohol or take sleeping pills. Get help right away if you have trouble breathing, or if you suddenly become confused. This information is not intended to replace advice given to you by your health care provider. Make sure you discuss any questions you have with your healthcare provider. Document Revised: 04/20/2020 Document Reviewed:  07/08/2019 Elsevier Patient Education  2022 [employer].

## 2021-03-02 ENCOUNTER — Telehealth (INDEPENDENT_AMBULATORY_CARE_PROVIDER_SITE_OTHER): Payer: Self-pay | Admitting: Gastroenterology

## 2021-03-02 NOTE — Telephone Encounter (Signed)
Patient would like a return call regarding her colonoscopy - ph# [phone]

## 2021-03-05 ENCOUNTER — Telehealth (INDEPENDENT_AMBULATORY_CARE_PROVIDER_SITE_OTHER): Payer: Self-pay

## 2021-03-05 ENCOUNTER — Encounter (HOSPITAL_COMMUNITY)
Admission: RE | Admit: 2021-03-05 | Discharge: 2021-03-05 | Disposition: A | Discharge status: Home / Self Care | Payer: Medicare Other | Source: Ambulatory Visit | Attending: Internal Medicine | Admitting: Internal Medicine

## 2021-03-05 ENCOUNTER — Other Ambulatory Visit (HOSPITAL_COMMUNITY)
Admission: RE | Admit: 2021-03-05 | Discharge: 2021-03-05 | Disposition: A | Discharge status: Home / Self Care | Payer: Medicare Other | Source: Ambulatory Visit | Attending: Internal Medicine | Admitting: Internal Medicine

## 2021-03-05 ENCOUNTER — Other Ambulatory Visit: Payer: Self-pay

## 2021-03-05 DIAGNOSIS — Z1211 Encounter for screening for malignant neoplasm of colon: Secondary | ICD-10-CM | POA: Insufficient documentation

## 2021-03-05 LAB — BASIC METABOLIC PANEL
Anion gap: 9 (ref 5–15)
BUN: 22 mg/dL (ref 8–23)
CO2: 21 mmol/L — ABNORMAL LOW (ref 22–32)
Calcium: 8.9 mg/dL (ref 8.9–10.3)
Chloride: 107 mmol/L (ref 98–111)
Creatinine, Ser: 1 mg/dL (ref 0.44–1.00)
GFR, Estimated: >60 mL/min (ref >60)
Glucose, Bld: 191 mg/dL — ABNORMAL HIGH (ref 70–99)
Potassium: 4.3 mmol/L (ref 3.5–5.1)
Sodium: 137 mmol/L (ref 135–145)

## 2021-03-05 MED ORDER — PEG 3350-KCL-NA BICARB-NACL 420 G PO SOLR: 4000.0000 mL | ORAL | 0 refills | Status: DC

## 2021-03-05 NOTE — Telephone Encounter (Signed)
Crystal Snow, CMA  

## 2021-03-06 ENCOUNTER — Other Ambulatory Visit (INDEPENDENT_AMBULATORY_CARE_PROVIDER_SITE_OTHER): Payer: Self-pay

## 2021-03-06 NOTE — Telephone Encounter (Signed)
Done

## 2021-03-07 ENCOUNTER — Other Ambulatory Visit: Payer: Self-pay

## 2021-03-07 ENCOUNTER — Ambulatory Visit (HOSPITAL_COMMUNITY)
Admission: RE | Admit: 2021-03-07 | Discharge: 2021-03-07 | Disposition: A | Discharge status: Home / Self Care | Payer: Medicare Other | Attending: Internal Medicine | Admitting: Internal Medicine

## 2021-03-07 ENCOUNTER — Ambulatory Visit (HOSPITAL_COMMUNITY): Payer: Medicare Other | Admitting: Anesthesiology

## 2021-03-07 ENCOUNTER — Encounter (HOSPITAL_COMMUNITY): Payer: Self-pay | Admitting: Internal Medicine

## 2021-03-07 ENCOUNTER — Encounter (HOSPITAL_COMMUNITY)
Admission: RE | Disposition: A | Discharge status: Home / Self Care | Payer: Self-pay | Source: Home / Self Care | Attending: Internal Medicine

## 2021-03-07 DIAGNOSIS — Z7982 Long term (current) use of aspirin: Secondary | ICD-10-CM | POA: Diagnosis not present

## 2021-03-07 DIAGNOSIS — K648 Other hemorrhoids: Secondary | ICD-10-CM | POA: Diagnosis not present

## 2021-03-07 DIAGNOSIS — Z7984 Long term (current) use of oral hypoglycemic drugs: Secondary | ICD-10-CM | POA: Diagnosis not present

## 2021-03-07 DIAGNOSIS — Z1211 Encounter for screening for malignant neoplasm of colon: Secondary | ICD-10-CM | POA: Diagnosis not present

## 2021-03-07 DIAGNOSIS — Z79899 Other long term (current) drug therapy: Secondary | ICD-10-CM | POA: Insufficient documentation

## 2021-03-07 DIAGNOSIS — I429 Cardiomyopathy, unspecified: Secondary | ICD-10-CM | POA: Diagnosis not present

## 2021-03-07 DIAGNOSIS — Z8673 Personal history of transient ischemic attack (TIA), and cerebral infarction without residual deficits: Secondary | ICD-10-CM | POA: Insufficient documentation

## 2021-03-07 DIAGNOSIS — K644 Residual hemorrhoidal skin tags: Secondary | ICD-10-CM | POA: Insufficient documentation

## 2021-03-07 HISTORY — PX: COLONOSCOPY WITH PROPOFOL: SHX5780

## 2021-03-07 LAB — GLUCOSE, CAPILLARY
Glucose-Capillary: 139 mg/dL — ABNORMAL HIGH (ref 70–99)
Glucose-Capillary: 107 mg/dL — ABNORMAL HIGH (ref 70–99)

## 2021-03-07 LAB — HM COLONOSCOPY

## 2021-03-07 SURGERY — COLONOSCOPY WITH PROPOFOL
Anesthesia: General

## 2021-03-07 MED ORDER — PHENYLEPHRINE HCL (PRESSORS) 10 MG/ML IV SOLN
INTRAVENOUS | Status: DC | PRN
  Administered 2021-03-07 (×3): 80 ug via INTRAVENOUS

## 2021-03-07 MED ORDER — LIDOCAINE HCL (CARDIAC) PF 100 MG/5ML IV SOSY
PREFILLED_SYRINGE | INTRAVENOUS | Status: DC | PRN
  Administered 2021-03-07: 50 mg via INTRAVENOUS

## 2021-03-07 MED ORDER — PROPOFOL 500 MG/50ML IV EMUL
INTRAVENOUS | Status: DC | PRN
  Administered 2021-03-07: 125 ug/kg/min via INTRAVENOUS

## 2021-03-07 MED ORDER — LACTATED RINGERS IV SOLN: INTRAVENOUS | Status: DC

## 2021-03-07 MED ORDER — PROPOFOL 10 MG/ML IV BOLUS
INTRAVENOUS | Status: DC | PRN
  Administered 2021-03-07: 90 mg via INTRAVENOUS

## 2021-03-07 NOTE — Discharge Instructions (Signed)
Resume usual medications including aspirin as before. Resume usual diet. No driving for 24 hours.  Next screening exam in 10 years.  Please make appointment to see Dr. Dina Rich of cardiology service as discussed.

## 2021-03-07 NOTE — Anesthesia Postprocedure Evaluation (Signed)
Anesthesia Post Note  Patient: Crystal Snow  Procedure(s) Performed: COLONOSCOPY WITH PROPOFOL  Patient location during evaluation: Phase II Anesthesia Type: General Level of consciousness: awake and alert and oriented Pain management: pain level controlled Vital Signs Assessment: post-procedure vital signs reviewed and stable Respiratory status: spontaneous breathing and respiratory function stable Cardiovascular status: blood pressure returned to baseline and stable Postop Assessment: no apparent nausea or vomiting Anesthetic complications: no   No notable events documented.   Last Vitals:  Vitals:   03/07/21 1008 03/07/21 1021  BP: 97/63 104/69  Pulse: 72   Resp: 18   Temp: 36.7 C   SpO2: 98%     Last Pain:  Vitals:   03/07/21 1008  TempSrc: Oral  PainSc: 0-No pain                 Aaleeyah Bias C Tyrel Lex

## 2021-03-07 NOTE — H&P (Signed)
Crystal Snow is an 60 y.o. female.   Chief Complaint: Patient is here for colonoscopy. HPI: Patient is 61 year old African-American female who is here for screening colonoscopy.  This is patient's first exam.  She denies abdominal pain change in bowel habits or rectal bleeding.  Family history is negative for CRC.  Last aspirin dose was yesterday.  She is on low-dose.  She has history of heart murmur.  Echo in 2019 revealed calcified aortic valve but no stenosis. Family history is negative for CRC.  Past Medical History:  Diagnosis Date   Cholecystitis    lap chole 2006   CVA (cerebral infarction)    03/2014   Diabetes mellitus without complication (HCC)    Dyslipidemia    Gout    Hypertension    Obesity (BMI 30-39.9)    Obstructive sleep apnea     Past Surgical History:  Procedure Laterality Date   ABDOMINAL HYSTERECTOMY      Family History  Problem Relation Age of Onset   Diabetes Mother    CVA Mother    Hypertension Mother    Cancer Father    Hypertension Sister    Cancer Brother    Cancer Maternal Grandmother    Diabetes Maternal Grandmother    Cancer Maternal Grandfather    Cancer Paternal Grandmother    Cancer Paternal Grandfather    Social History:  reports that she has never smoked. She has never used smokeless tobacco. She reports that she does not drink alcohol and does not use drugs.  Allergies: No Known Allergies  Medications Prior to Admission  Medication Sig Dispense Refill   allopurinol (ZYLOPRIM) 100 MG tablet Take 100 mg by mouth daily.  2   amLODipine (NORVASC) 10 MG tablet Take 10 mg by mouth daily.  3   aspirin EC 81 MG tablet Take 81 mg by mouth daily.     atorvastatin (LIPITOR) 80 MG tablet Take 80 mg by mouth daily.     carvedilol (COREG) 12.5 MG tablet Take 1 tablet (12.5 mg total) by mouth 2 (two) times daily. (Patient taking differently: Take 25 mg by mouth 2 (two) times daily.) 180 tablet 3   Cholecalciferol (VITAMIN D3) 50 MCG (2000  UT) TABS Take 2,000 Units by mouth daily.     DULoxetine (CYMBALTA) 30 MG capsule Take 30 mg by mouth daily.     Fiber Adult Gummies 2 g CHEW Chew 2 g by mouth daily.     gabapentin (NEURONTIN) 300 MG capsule Take 300 mg by mouth 2 (two) times daily.     glipiZIDE (GLUCOTROL) 10 MG tablet Take 10 mg by mouth 2 (two) times daily before a meal.  3   hydrOXYzine (VISTARIL) 25 MG capsule Take 25 mg by mouth daily.     lisinopril (PRINIVIL,ZESTRIL) 20 MG tablet Take 1 tablet (20 mg total) by mouth at bedtime. (Patient taking differently: Take 40 mg by mouth daily.) 90 tablet 3   metFORMIN (GLUCOPHAGE) 1000 MG tablet Take 1 tablet (1,000 mg total) by mouth 2 (two) times daily with a meal. 60 tablet 2   Multiple Vitamins-Minerals (MULTIVITAMIN WITH MINERALS) tablet Take 1 tablet by mouth daily. Centrum     polyethylene glycol-electrolytes (TRILYTE) 420 g solution Take 4,000 mLs by mouth as directed. 4000 mL 0   polyethylene glycol-electrolytes (TRILYTE) 420 g solution Take 4,000 mLs by mouth as directed. 4000 mL 0   vitamin B-12 (CYANOCOBALAMIN) 1000 MCG tablet Take 1,000 mcg by mouth daily.  vitamin C (ASCORBIC ACID) 250 MG tablet Take 250 mg by mouth daily.     vitamin E 180 MG (400 UNITS) capsule Take 400 Units by mouth daily.      Results for orders placed or performed during the hospital encounter of 03/07/21 (from the past 48 hour(s))  Glucose, capillary     Status: Abnormal   Collection Time: 03/07/21  9:15 AM  Result Value Ref Range   Glucose-Capillary 139 (H) 70 - 99 mg/dL    Comment: Glucose reference range applies only to samples taken after fasting for at least 8 hours.   No results found.  Review of Systems  Blood pressure 119/76, pulse 67, temperature 98.6 F (37 C), temperature source Oral, resp. rate 18, height 5\' 2"  (1.575 m), weight 107 kg, SpO2 98 %. Physical Exam HENT:     Mouth/Throat:     Mouth: Mucous membranes are moist.     Pharynx: Oropharynx is clear.  Eyes:      General: No scleral icterus.    Conjunctiva/sclera: Conjunctivae normal.  Cardiovascular:     Rate and Rhythm: Normal rate and regular rhythm.     Heart sounds: Murmur heard.     Comments: Grade 2/6 systolic murmur at aortic area Pulmonary:     Effort: Pulmonary effort is normal.     Breath sounds: Normal breath sounds.  Abdominal:     General: There is no distension.     Palpations: Abdomen is soft. There is no mass.     Tenderness: There is no abdominal tenderness.  Musculoskeletal:        General: No swelling.     Cervical back: Neck supple.  Lymphadenopathy:     Cervical: No cervical adenopathy.  Skin:    General: Skin is warm and dry.  Neurological:     Mental Status: She is alert.     Assessment/Plan  Average risk screening colonoscopy  , MD 03/07/2021, 9:39 AM

## 2021-03-07 NOTE — Op Note (Signed)
[facility] Patient Name: Crystal Snow Procedure Date: 03/07/2021 9:25 AM MRN: [id number] Date of Birth: 02/11/1961 Attending MD: Lionel December , MD CSN: [id number] Age: 60 Admit Type: Outpatient Procedure:                Colonoscopy Indications:              Screening for colorectal malignant neoplasm Providers:                Lionel December, MD, Jannett Celestine, RN, Burke Keels,                            Technician Referring MD:             Sullivan Lone, MD Medicines:                Propofol per Anesthesia Complications:            No immediate complications. Estimated Blood Loss:     Estimated blood loss: none. Procedure:                Pre-Anesthesia Assessment:                           - Prior to the procedure, a History and Physical                            was performed, and patient medications and                            allergies were reviewed. The patient's tolerance of                            previous anesthesia was also reviewed. The risks                            and benefits of the procedure and the sedation                            options and risks were discussed with the patient.                            All questions were answered, and informed consent                            was obtained. Prior Anticoagulants: The patient has                            taken no previous anticoagulant or antiplatelet                            agents except for aspirin. ASA Grade Assessment:                            III - A patient with severe systemic disease. After  reviewing the risks and benefits, the patient was                            deemed in satisfactory condition to undergo the                            procedure.                           After obtaining informed consent, the colonoscope                            was passed under direct vision. Throughout the                            procedure, the  patient's blood pressure, pulse, and                            oxygen saturations were monitored continuously. The                            [id number]) was introduced through the anus                            and advanced to the the cecum, identified by                            appendiceal orifice and ileocecal valve. The                            colonoscopy was performed without difficulty. The                            patient tolerated the procedure well. The quality                            of the bowel preparation was excellent. The                            ileocecal valve, appendiceal orifice, and rectum                            were photographed. Scope In: 9:47:41 AM Scope Out: 10:00:21 AM Scope Withdrawal Time: 0 hours 7 minutes 19 seconds  Total Procedure Duration: 0 hours 12 minutes 40 seconds  Findings:      The perianal and digital rectal examinations were normal.      The colon (entire examined portion) appeared normal.      External hemorrhoids were found during retroflexion. The hemorrhoids       were small. Impression:               - The entire examined colon is normal.                           - External hemorrhoids.                           -  No specimens collected. Moderate Sedation:      Per Anesthesia Care Recommendation:           - Patient has a contact number available for                            emergencies. The signs and symptoms of potential                            delayed complications were discussed with the                            patient. Return to normal activities tomorrow.                            Written discharge instructions were provided to the                            patient.                           - Resume previous diet today.                           - Continue present medications.                           - Repeat colonoscopy for screening purposes. Procedure Code(s):        --- Professional ---                            [phone], Colonoscopy, flexible; diagnostic, including                            collection of specimen(s) by brushing or washing,                            when performed (separate procedure) Diagnosis Code(s):        --- Professional ---                           Z12.11, Encounter for screening for malignant                            neoplasm of colon                           K64.4, Residual hemorrhoidal skin tags CPT copyright 2019 American Medical Association. All rights reserved. The codes documented in this report are preliminary and upon coder review may  be revised to meet current compliance requirements. Lionel December, MD Lionel December, MD 03/07/2021 10:07:30 AM This report has been signed electronically. Number of Addenda: 0

## 2021-03-07 NOTE — Transfer of Care (Signed)
Immediate Anesthesia Transfer of Care Note  Patient: Crystal Snow  Procedure(s) Performed: COLONOSCOPY WITH PROPOFOL  Patient Location: Short Stay  Anesthesia Type:General  Level of Consciousness: awake and alert   Airway & Oxygen Therapy: Patient Spontanous Breathing  Post-op Assessment: Report given to RN and Post -op Vital signs reviewed and stable  Post vital signs: Reviewed and stable  Last Vitals:  Vitals Value Taken Time  BP    Temp    Pulse    Resp    SpO2      Last Pain:  Vitals:   03/07/21 0944  TempSrc:   PainSc: 0-No pain         Complications: No notable events documented.

## 2021-03-07 NOTE — Anesthesia Preprocedure Evaluation (Signed)
Anesthesia Evaluation  Patient identified by MRN, date of birth, ID band Patient awake    Reviewed: Allergy & Precautions, NPO status , Patient's Chart, lab work & pertinent test results, reviewed documented beta blocker date and time   History of Anesthesia Complications Negative for: history of anesthetic complications  Airway Mallampati: III  TM Distance: >3 FB Neck ROM: Full  Mouth opening: Limited Mouth Opening  Dental  (+) Dental Advisory Given, Poor Dentition, Chipped, Missing   Pulmonary sleep apnea ,    Pulmonary exam normal breath sounds clear to auscultation       Cardiovascular Exercise Tolerance: Good hypertension, Pt. on medications and Pt. on home beta blockers Normal cardiovascular exam Rhythm:Regular Rate:Normal     Neuro/Psych TIACVA, Residual Symptoms negative psych ROS   GI/Hepatic negative GI ROS, Neg liver ROS,   Endo/Other  diabetes, Well Controlled, Type 2, Oral Hypoglycemic Agents  Renal/GU negative Renal ROS     Musculoskeletal negative musculoskeletal ROS (+)   Abdominal   Peds  Hematology negative hematology ROS (+)   Anesthesia Other Findings   Reproductive/Obstetrics negative OB ROS                             Anesthesia Physical Anesthesia Plan  ASA: 3  Anesthesia Plan: General   Post-op Pain Management:    Induction: Intravenous  PONV Risk Score and Plan: Propofol infusion  Airway Management Planned: Nasal Cannula and Natural Airway  Additional Equipment:   Intra-op Plan:   Post-operative Plan:   Informed Consent: I have reviewed the patients History and Physical, chart, labs and discussed the procedure including the risks, benefits and alternatives for the proposed anesthesia with the patient or authorized representative who has indicated his/her understanding and acceptance.     Dental advisory given  Plan Discussed with: CRNA and  Surgeon  Anesthesia Plan Comments:         Anesthesia Quick Evaluation

## 2021-03-08 ENCOUNTER — Encounter (INDEPENDENT_AMBULATORY_CARE_PROVIDER_SITE_OTHER): Payer: Self-pay | Admitting: *Deleted

## 2021-03-13 ENCOUNTER — Encounter (HOSPITAL_COMMUNITY): Payer: Self-pay | Admitting: Internal Medicine

## 2021-03-15 ENCOUNTER — Ambulatory Visit (INDEPENDENT_AMBULATORY_CARE_PROVIDER_SITE_OTHER): Payer: Medicare Other | Admitting: Cardiology

## 2021-03-15 ENCOUNTER — Other Ambulatory Visit (HOSPITAL_COMMUNITY)
Admission: RE | Admit: 2021-03-15 | Discharge: 2021-03-15 | Disposition: A | Discharge status: Home / Self Care | Payer: Medicare Other | Source: Ambulatory Visit | Attending: Cardiology | Admitting: Cardiology

## 2021-03-15 ENCOUNTER — Other Ambulatory Visit: Payer: Self-pay

## 2021-03-15 ENCOUNTER — Ambulatory Visit (HOSPITAL_COMMUNITY)
Admission: RE | Admit: 2021-03-15 | Discharge: 2021-03-15 | Disposition: A | Discharge status: Home / Self Care | Payer: Medicare Other | Source: Ambulatory Visit | Attending: Family Medicine | Admitting: Family Medicine

## 2021-03-15 ENCOUNTER — Encounter: Payer: Self-pay | Admitting: Cardiology

## 2021-03-15 VITALS — BP 118/78 | HR 84 | Ht 62.0 in | Wt 231.8 lb

## 2021-03-15 DIAGNOSIS — N179 Acute kidney failure, unspecified: Secondary | ICD-10-CM

## 2021-03-15 DIAGNOSIS — E875 Hyperkalemia: Secondary | ICD-10-CM

## 2021-03-15 DIAGNOSIS — Z1231 Encounter for screening mammogram for malignant neoplasm of breast: Secondary | ICD-10-CM

## 2021-03-15 DIAGNOSIS — I429 Cardiomyopathy, unspecified: Secondary | ICD-10-CM

## 2021-03-15 DIAGNOSIS — I350 Nonrheumatic aortic (valve) stenosis: Secondary | ICD-10-CM | POA: Diagnosis not present

## 2021-03-15 DIAGNOSIS — F410 Panic disorder [episodic paroxysmal anxiety] without agoraphobia: Secondary | ICD-10-CM

## 2021-03-15 LAB — BASIC METABOLIC PANEL
Anion gap: 6 (ref 5–15)
BUN: 22 mg/dL (ref 8–23)
CO2: 25 mmol/L (ref 22–32)
Calcium: 8.9 mg/dL (ref 8.9–10.3)
Chloride: 104 mmol/L (ref 98–111)
Creatinine, Ser: 0.93 mg/dL (ref 0.44–1.00)
GFR, Estimated: >60 mL/min (ref >60)
Glucose, Bld: 228 mg/dL — ABNORMAL HIGH (ref 70–99)
Potassium: 4.2 mmol/L (ref 3.5–5.1)
Sodium: 135 mmol/L (ref 135–145)

## 2021-03-15 NOTE — Patient Instructions (Signed)
Medication Instructions:  Your physician recommends that you continue on your current medications as directed. Please refer to the Current Medication list given to you today.  *If you need a refill on your cardiac medications before your next appointment, please call your pharmacy*   Lab Work:  BMET today  If you have labs (blood work) drawn today and your tests are completely normal, you will receive your results only by: MyChart Message (if you have MyChart) OR A paper copy in the mail If you have any lab test that is abnormal or we need to change your treatment, we will call you to review the results.   Testing/Procedures: Your physician has requested that you have an echocardiogram. Echocardiography is a painless test that uses sound waves to create images of your heart. It provides your doctor with information about the size and shape of your heart and how well your heart's chambers and valves are working. This procedure takes approximately one hour. There are no restrictions for this procedure.    Follow-Up: At [facility], you and your health needs are our priority.  As part of our continuing mission to provide you with exceptional heart care, we have created designated Provider Care Teams.  These Care Teams include your primary Cardiologist (physician) and Advanced Practice Providers (APPs -  Physician Assistants and Nurse Practitioners) who all work together to provide you with the care you need, when you need it.  We recommend signing up for the patient portal called "MyChart".  Sign up information is provided on this After Visit Summary.  MyChart is used to connect with patients for Virtual Visits (Telemedicine).  Patients are able to view lab/test results, encounter notes, upcoming appointments, etc.  Non-urgent messages can be sent to your provider as well.   To learn more about what you can do with MyChart, go to [email or web address].    Your next appointment:   12  month(s)  The format for your next appointment:   In Person  Provider:   Dina Rich, MD   Other Instructions None

## 2021-03-15 NOTE — Progress Notes (Signed)
Cardiology Office Note   Date:  03/15/2021   ID:  Crystal Snow, DOB 12/31/1960, MRN [id number]  PCP:  Tanna Furry, MD  Cardiologist:  was Dr. Purvis Sheffield     Chief Complaint  Patient presents with   Cardiomyopathy   Hospitalization Follow-up      History of Present Illness: Crystal Snow is a 60 y.o. female who presents for cardiomyopathy  though EF was normal in 2016.  On BB and ace.  Hx of HTN, HLD AS though improved on last echo and only mild in 2016.  Hx of CVA  and diabetes..    Today she presents after episode in June she was seen in ER.  She had a panic attack while at laundry mat.  She was hot and then SOB and felt her heart racing.  This is her second panic attack, she had one last year.  She was hot both times.  In ER EKG SR with 1st degree AV block.  Her Cr was elevated at 2.10 up from 1.32 in 2020.  Her K+ was 4.9  she is on lisinopril 40 mg daily.   She has no chest pressure or pain. She is DOE, none at night only with activity and heat makes it worse.  Has been going on for some time.    She just had colonoscopy which was normal.     Past Medical History:  Diagnosis Date   Cholecystitis    lap chole 2006   CVA (cerebral infarction)    03/2014   Diabetes mellitus without complication (HCC)    Dyslipidemia    Gout    Hypertension    Obesity (BMI 30-39.9)    Obstructive sleep apnea     Past Surgical History:  Procedure Laterality Date   ABDOMINAL HYSTERECTOMY     COLONOSCOPY WITH PROPOFOL N/A 03/07/2021   Procedure: COLONOSCOPY WITH PROPOFOL;  Surgeon: Malissa Hippo, MD;  [facility];  Service: Endoscopy;  Laterality: N/A;  10:55     Current Outpatient Medications  Medication Sig Dispense Refill   allopurinol (ZYLOPRIM) 100 MG tablet Take 100 mg by mouth daily.  2   amLODipine (NORVASC) 10 MG tablet Take 10 mg by mouth daily.  3   aspirin EC 81 MG tablet Take 81 mg by mouth daily.     atorvastatin (LIPITOR) 80 MG  tablet Take 80 mg by mouth daily.     carvedilol (COREG) 25 MG tablet Take 25 mg by mouth 2 (two) times daily.     Cholecalciferol (VITAMIN D3) 50 MCG (2000 UT) TABS Take 2,000 Units by mouth daily.     DULoxetine (CYMBALTA) 30 MG capsule Take 30 mg by mouth daily.     Fiber Adult Gummies 2 g CHEW Chew 2 g by mouth daily.     gabapentin (NEURONTIN) 300 MG capsule Take 300 mg by mouth 2 (two) times daily.     glipiZIDE (GLUCOTROL) 10 MG tablet Take 10 mg by mouth 2 (two) times daily before a meal.  3   hydrOXYzine (VISTARIL) 25 MG capsule Take 25 mg by mouth daily.     lisinopril (ZESTRIL) 40 MG tablet Take 40 mg by mouth daily.     metFORMIN (GLUCOPHAGE) 1000 MG tablet Take 1 tablet (1,000 mg total) by mouth 2 (two) times daily with a meal. 60 tablet 2   Multiple Vitamins-Minerals (MULTIVITAMIN WITH MINERALS) tablet Take 1 tablet by mouth daily. Centrum     polyethylene glycol-electrolytes (  TRILYTE) 420 g solution Take 4,000 mLs by mouth as directed. 4000 mL 0   vitamin B-12 (CYANOCOBALAMIN) 1000 MCG tablet Take 1,000 mcg by mouth daily.     vitamin C (ASCORBIC ACID) 250 MG tablet Take 250 mg by mouth daily.     vitamin E 180 MG (400 UNITS) capsule Take 400 Units by mouth daily.     lisinopril (PRINIVIL,ZESTRIL) 20 MG tablet Take 1 tablet (20 mg total) by mouth at bedtime. (Patient not taking: Reported on 03/15/2021) 90 tablet 3   No current facility-administered medications for this visit.    Allergies:   Patient has no known allergies.    Social History:  The patient  reports that she has never smoked. She has never used smokeless tobacco. She reports that she does not drink alcohol and does not use drugs.   Family History:  The patient's family history includes CVA in her mother; Cancer in her brother, father, maternal grandfather, maternal grandmother, paternal grandfather, and paternal grandmother; Diabetes in her maternal grandmother and mother; Hypertension in her mother and sister.     ROS:  General:no colds or fevers, + weight loss Skin:no rashes or ulcers HEENT:no blurred vision, no congestion CV:see HPI PUL:see HPI GI:no diarrhea constipation or melena, no indigestion GU:no hematuria, no dysuria MS:no joint pain, no claudication Neuro:no syncope, no lightheadedness Endo:+ diabetes has been stable. no thyroid disease  Wt Readings from Last 3 Encounters:  03/15/21 231 lb 12.8 oz (105.1 kg)  03/07/21 235 lb 14.3 oz (107 kg)  02/09/18 239 lb (108.4 kg)     PHYSICAL EXAM: VS:  BP 118/78   Pulse 84   Ht 5\' 2"  (1.575 m)   Wt 231 lb 12.8 oz (105.1 kg)   SpO2 96%   BMI 42.40 kg/m  , BMI Body mass index is 42.4 kg/m. General:Pleasant affect, NAD Skin:Warm and dry, brisk capillary refill HEENT:normocephalic, sclera clear, mucus membranes moist Neck:supple, no JVD, no bruits  Heart:S1S2 RRR with 1/6 systolic murmur, no gallup, rub or click Lungs:clear without rales, rhonchi, or wheezes , non tender, + BS, do not palpate liver spleen or masses Ext:no lower ext edema, 2+ pedal pulses, 2+ radial pulses Neuro:alert and oriented X 3, MAE, follows commands, + facial symmetry    EKG:  EKG is Not ordered today. The ekg from ER was reviewed.  And SR with 1st degree AV block  Recent Labs: 03/05/2021: BUN 22; Creatinine, Ser 1.00; Potassium 4.3; Sodium 137    Lipid Panel    Component Value Date/Time   CHOL 190 01/24/2015 0523   TRIG 145 01/24/2015 0523   HDL 56 01/24/2015 0523   CHOLHDL 3.4 01/24/2015 0523   VLDL 29 01/24/2015 0523   LDLCALC 105 (H) 01/24/2015 0523       Other studies Reviewed: Additional studies/ records that were reviewed today include: . Echo 02/12/18 Study Conclusions   - Left ventricle: The cavity size was normal. Wall thickness was    increased in a pattern of mild LVH. Systolic function was normal.    The estimated ejection fraction was in the range of 60% to 65%.    Wall motion was normal; there were no regional  wall motion    abnormalities. Doppler parameters are consistent with abnormal    left ventricular relaxation (grade 1 diastolic dysfunction).  - Aortic valve: Moderately calcified annulus. Trileaflet;    moderately thickened leaflets. Valve area (VTI): 1.87 cm^2. Valve    area (Vmax): 1.81 cm^2. Valve area (Vmean):  1.59 cm^2.  - Technically adequate study.   -------------------------------------------------------------------  Study data:  Comparison was made to the study of 01/24/2015.  Study  status:  Routine.  Procedure:  Transthoracic echocardiography.  Image quality was adequate.          Transthoracic  echocardiography.  M-mode, complete 2D, spectral Doppler, and color  Doppler.  Birthdate:  Patient birthdate: Jan 18, 1961.  Age:  Patient  is 60 yr old.  Sex:  Gender: female.    BMI: 43.7 kg/m^2.  Blood  pressure:     146/86  Patient status:  Inpatient.  Study date:  Study date: 02/12/2018. Study time: 08:34 AM.  Location:  Bedside.     -------------------------------------------------------------------   -------------------------------------------------------------------  Left ventricle:  The cavity size was normal. Wall thickness was  increased in a pattern of mild LVH. Systolic function was normal.  The estimated ejection fraction was in the range of 60% to 65%.  Wall motion was normal; there were no regional wall motion  abnormalities. Doppler parameters are consistent with abnormal left  ventricular relaxation (grade 1 diastolic dysfunction). There was  no evidence of elevated ventricular filling pressure by Doppler  parameters.   -------------------------------------------------------------------  Aortic valve:   Moderately calcified annulus. Trileaflet;  moderately thickened leaflets.  Doppler:   There was no stenosis.  There was no significant regurgitation.    VTI ratio of LVOT to  aortic valve: 0.66. Valve area (VTI): 1.87 cm^2. Indexed valve area  (VTI): 0.83  cm^2/m^2. Peak velocity ratio of LVOT to aortic valve:  0.64. Valve area (Vmax): 1.81 cm^2. Indexed valve area (Vmax): 0.81  cm^2/m^2. Mean velocity ratio of LVOT to aortic valve: 0.56. Valve  area (Vmean): 1.59 cm^2. Indexed valve area (Vmean): 0.71 cm^2/m^2.     Mean gradient (S): 9 mm Hg. Peak gradient (S): 15 mm Hg.   -------------------------------------------------------------------  Aorta:  Aortic root: The aortic root was normal in size.   -------------------------------------------------------------------  Mitral valve:   Normal thickness leaflets .  Doppler:   There was  no evidence for stenosis.   There was no significant regurgitation.     -------------------------------------------------------------------  Left atrium:  The atrium was normal in size.   -------------------------------------------------------------------  Right ventricle:  The cavity size was normal. Systolic function was  normal.   -------------------------------------------------------------------  Pulmonic valve:   Not well visualized.  Doppler:   There was no  evidence for stenosis.   There was no significant regurgitation.     -------------------------------------------------------------------  Tricuspid valve:   Normal thickness leaflets.  Doppler:   There was  no evidence for stenosis.   There was no significant regurgitation.     -------------------------------------------------------------------  Pulmonary artery:    Systolic pressure could not be accurately  estimated.   Inadequate TR jet.   -------------------------------------------------------------------  Right atrium:  The atrium was normal in size.   -------------------------------------------------------------------  Pericardium:  There was no pericardial effusion.   ASSESSMENT AND PLAN:  1.  Hx of cardiomyopathy but last EF normal.  Mild AS now with SOB may be worse.  Will recheck echo  2.  Recent panic attack due to dehydration.   Her Cr was elevated, she is on ACE at 40 mg will recheck BMP to eval.   3.  AKI on ER visit will recheck Cr and K+ may need to decrease lisinopril.   4.  Hx of CVA per pcp   Current medicines are reviewed with the patient today.  The patient Has no concerns regarding medicines.  The following changes have been made:  See above Labs/ tests ordered today include:see above  Disposition:   FU:  see above  Signed, Nada Boozer, NP  03/15/2021 2:08 PM    [facility] [address] [place], [place], [place]  [address]/ [address] [place], [place] Phone: [phone]; Fax: [phone]  [phone]

## 2021-03-16 ENCOUNTER — Other Ambulatory Visit (HOSPITAL_COMMUNITY): Payer: Self-pay | Admitting: Family Medicine

## 2021-03-19 ENCOUNTER — Other Ambulatory Visit (HOSPITAL_COMMUNITY): Payer: Self-pay | Admitting: Family Medicine

## 2021-03-19 DIAGNOSIS — R928 Other abnormal and inconclusive findings on diagnostic imaging of breast: Secondary | ICD-10-CM

## 2021-03-20 ENCOUNTER — Other Ambulatory Visit: Payer: Self-pay

## 2021-03-20 ENCOUNTER — Other Ambulatory Visit (HOSPITAL_COMMUNITY): Payer: Self-pay | Admitting: Family Medicine

## 2021-03-20 ENCOUNTER — Ambulatory Visit (HOSPITAL_COMMUNITY)
Admission: RE | Admit: 2021-03-20 | Discharge: 2021-03-20 | Disposition: A | Discharge status: Home / Self Care | Payer: Medicare Other | Source: Ambulatory Visit | Attending: Family Medicine | Admitting: Family Medicine

## 2021-03-20 DIAGNOSIS — R922 Inconclusive mammogram: Secondary | ICD-10-CM | POA: Diagnosis not present

## 2021-03-20 DIAGNOSIS — R928 Other abnormal and inconclusive findings on diagnostic imaging of breast: Secondary | ICD-10-CM | POA: Insufficient documentation

## 2021-03-20 DIAGNOSIS — N6314 Unspecified lump in the right breast, lower inner quadrant: Secondary | ICD-10-CM | POA: Diagnosis not present

## 2021-03-27 ENCOUNTER — Other Ambulatory Visit: Payer: Self-pay

## 2021-03-27 ENCOUNTER — Ambulatory Visit (HOSPITAL_COMMUNITY)
Admission: RE | Admit: 2021-03-27 | Discharge: 2021-03-27 | Disposition: A | Discharge status: Home / Self Care | Payer: Medicare Other | Source: Ambulatory Visit | Attending: Family Medicine | Admitting: Family Medicine

## 2021-03-27 ENCOUNTER — Other Ambulatory Visit (HOSPITAL_COMMUNITY): Payer: Self-pay | Admitting: Family Medicine

## 2021-03-27 ENCOUNTER — Encounter (HOSPITAL_COMMUNITY): Payer: Self-pay

## 2021-03-27 DIAGNOSIS — R928 Other abnormal and inconclusive findings on diagnostic imaging of breast: Secondary | ICD-10-CM

## 2021-03-27 DIAGNOSIS — N6011 Diffuse cystic mastopathy of right breast: Secondary | ICD-10-CM | POA: Diagnosis not present

## 2021-03-27 DIAGNOSIS — N6021 Fibroadenosis of right breast: Secondary | ICD-10-CM | POA: Diagnosis not present

## 2021-03-27 DIAGNOSIS — N6314 Unspecified lump in the right breast, lower inner quadrant: Secondary | ICD-10-CM | POA: Diagnosis not present

## 2021-03-27 DIAGNOSIS — N6489 Other specified disorders of breast: Secondary | ICD-10-CM | POA: Diagnosis not present

## 2021-03-27 MED ORDER — LIDOCAINE-EPINEPHRINE (PF) 2 %-1:200000 IJ SOLN
INTRAMUSCULAR | Status: AC
  Administered 2021-03-27: 10 mL
  Filled 2021-03-27: qty 10

## 2021-03-27 MED ORDER — LIDOCAINE HCL (PF) 2 % IJ SOLN
INTRAMUSCULAR | Status: AC
  Administered 2021-03-27: 10 mL
  Filled 2021-03-27: qty 10

## 2021-03-27 NOTE — Sedation Documentation (Signed)
PT tolerated right breast biopsy well today with NAD noted. PT verbalized understanding of discharge instructions. PT ambulated back to the mammogram area this time and given ice pack and printed d/c instructions.

## 2021-03-28 DIAGNOSIS — I10 Essential (primary) hypertension: Secondary | ICD-10-CM | POA: Diagnosis not present

## 2021-03-28 DIAGNOSIS — K089 Disorder of teeth and supporting structures, unspecified: Secondary | ICD-10-CM | POA: Diagnosis not present

## 2021-03-28 DIAGNOSIS — M1A09X0 Idiopathic chronic gout, multiple sites, without tophus (tophi): Secondary | ICD-10-CM | POA: Diagnosis not present

## 2021-03-28 DIAGNOSIS — I5032 Chronic diastolic (congestive) heart failure: Secondary | ICD-10-CM | POA: Diagnosis not present

## 2021-03-28 DIAGNOSIS — M25562 Pain in left knee: Secondary | ICD-10-CM | POA: Diagnosis not present

## 2021-03-28 DIAGNOSIS — N189 Chronic kidney disease, unspecified: Secondary | ICD-10-CM | POA: Diagnosis not present

## 2021-03-28 DIAGNOSIS — E782 Mixed hyperlipidemia: Secondary | ICD-10-CM | POA: Diagnosis not present

## 2021-03-28 DIAGNOSIS — R809 Proteinuria, unspecified: Secondary | ICD-10-CM | POA: Diagnosis not present

## 2021-03-28 DIAGNOSIS — E119 Type 2 diabetes mellitus without complications: Secondary | ICD-10-CM | POA: Diagnosis not present

## 2021-03-28 DIAGNOSIS — R928 Other abnormal and inconclusive findings on diagnostic imaging of breast: Secondary | ICD-10-CM | POA: Diagnosis not present

## 2021-03-28 LAB — SURGICAL PATHOLOGY

## 2021-04-03 DIAGNOSIS — M25662 Stiffness of left knee, not elsewhere classified: Secondary | ICD-10-CM | POA: Diagnosis not present

## 2021-04-03 DIAGNOSIS — M25562 Pain in left knee: Secondary | ICD-10-CM | POA: Diagnosis not present

## 2021-04-03 DIAGNOSIS — M6281 Muscle weakness (generalized): Secondary | ICD-10-CM | POA: Diagnosis not present

## 2021-04-06 ENCOUNTER — Ambulatory Visit (HOSPITAL_COMMUNITY)
Admission: RE | Admit: 2021-04-06 | Discharge: 2021-04-06 | Disposition: A | Discharge status: Home / Self Care | Payer: Medicare Other | Source: Ambulatory Visit | Attending: Cardiology | Admitting: Cardiology

## 2021-04-06 ENCOUNTER — Other Ambulatory Visit: Payer: Self-pay

## 2021-04-06 DIAGNOSIS — I350 Nonrheumatic aortic (valve) stenosis: Secondary | ICD-10-CM | POA: Diagnosis not present

## 2021-04-06 LAB — ECHOCARDIOGRAM COMPLETE
AR max vel: 1.78 cm2
AV Area VTI: 1.94 cm2
AV Area mean vel: 1.85 cm2
AV Mean grad: 7 mmHg
AV Peak grad: 15.4 mmHg
Ao pk vel: 1.96 m/s
Area-P 1/2: 3.05 cm2
S' Lateral: 2.7 cm

## 2021-04-06 NOTE — Progress Notes (Signed)
*  PRELIMINARY RESULTS* Echocardiogram 2D Echocardiogram has been performed.  Stacey Drain 04/06/2021, 12:53 PM

## 2021-04-20 ENCOUNTER — Encounter: Payer: Self-pay | Admitting: *Deleted

## 2021-07-11 DIAGNOSIS — H1013 Acute atopic conjunctivitis, bilateral: Secondary | ICD-10-CM | POA: Diagnosis not present

## 2021-07-11 DIAGNOSIS — I10 Essential (primary) hypertension: Secondary | ICD-10-CM | POA: Diagnosis not present

## 2021-07-11 DIAGNOSIS — E782 Mixed hyperlipidemia: Secondary | ICD-10-CM | POA: Diagnosis not present

## 2021-07-11 DIAGNOSIS — N189 Chronic kidney disease, unspecified: Secondary | ICD-10-CM | POA: Diagnosis not present

## 2021-07-11 DIAGNOSIS — M25562 Pain in left knee: Secondary | ICD-10-CM | POA: Diagnosis not present

## 2021-07-11 DIAGNOSIS — R809 Proteinuria, unspecified: Secondary | ICD-10-CM | POA: Diagnosis not present

## 2021-07-11 DIAGNOSIS — Z23 Encounter for immunization: Secondary | ICD-10-CM | POA: Diagnosis not present

## 2021-07-11 DIAGNOSIS — E119 Type 2 diabetes mellitus without complications: Secondary | ICD-10-CM | POA: Diagnosis not present

## 2021-07-11 DIAGNOSIS — I5032 Chronic diastolic (congestive) heart failure: Secondary | ICD-10-CM | POA: Diagnosis not present

## 2021-07-11 DIAGNOSIS — M1A09X0 Idiopathic chronic gout, multiple sites, without tophus (tophi): Secondary | ICD-10-CM | POA: Diagnosis not present

## 2021-07-11 DIAGNOSIS — R928 Other abnormal and inconclusive findings on diagnostic imaging of breast: Secondary | ICD-10-CM | POA: Diagnosis not present

## 2021-10-11 DIAGNOSIS — I10 Essential (primary) hypertension: Secondary | ICD-10-CM | POA: Diagnosis not present

## 2021-10-11 DIAGNOSIS — N189 Chronic kidney disease, unspecified: Secondary | ICD-10-CM | POA: Diagnosis not present

## 2021-10-11 DIAGNOSIS — E119 Type 2 diabetes mellitus without complications: Secondary | ICD-10-CM | POA: Diagnosis not present

## 2021-10-11 DIAGNOSIS — Z0000 Encounter for general adult medical examination without abnormal findings: Secondary | ICD-10-CM | POA: Diagnosis not present

## 2021-10-11 DIAGNOSIS — M1A09X0 Idiopathic chronic gout, multiple sites, without tophus (tophi): Secondary | ICD-10-CM | POA: Diagnosis not present

## 2021-10-11 DIAGNOSIS — E782 Mixed hyperlipidemia: Secondary | ICD-10-CM | POA: Diagnosis not present

## 2021-10-11 DIAGNOSIS — M25562 Pain in left knee: Secondary | ICD-10-CM | POA: Diagnosis not present

## 2021-10-11 DIAGNOSIS — R809 Proteinuria, unspecified: Secondary | ICD-10-CM | POA: Diagnosis not present

## 2021-10-11 DIAGNOSIS — I5032 Chronic diastolic (congestive) heart failure: Secondary | ICD-10-CM | POA: Diagnosis not present

## 2021-10-11 DIAGNOSIS — R928 Other abnormal and inconclusive findings on diagnostic imaging of breast: Secondary | ICD-10-CM | POA: Diagnosis not present

## 2022-01-07 NOTE — Progress Notes (Unsigned)
Cardiology Office Note    Date:  01/09/2022   ID:  Crystal Snow, DOB [birth year], MRN [id number]   PCP:  Tanna Furry, MD   [facility]  Cardiologist:  None   Advanced Practice Provider:  No care team member to display Electrophysiologist:  None   [id number]   Chief Complaint  Patient presents with   Follow-up    History of Present Illness:  Crystal Snow is a 61 y.o. female with history of HTN, HLD, NICM normalized 2016, CVA, DM, mild AS  Last seen 02/2021 and doing well. Had an ED visit for panic attack due to dehydration. Crt was up. Repat echo 04/06/21 normal LVEF, grade 1DD, mild AS.  Patient comes in for f/u. Denies chest pain, dyspnea, palpitation, edema. 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 Walks her dog a mile twice a week. Scheduled for blood week next month.    Past Medical History:  Diagnosis Date   Cholecystitis    lap chole 2006   CVA (cerebral infarction)    03/2014   Diabetes mellitus without complication (HCC)    Dyslipidemia    Gout    Hypertension    Obesity (BMI 30-39.9)    Obstructive sleep apnea     Past Surgical History:  Procedure Laterality Date   ABDOMINAL HYSTERECTOMY     COLONOSCOPY WITH PROPOFOL N/A 03/07/2021   Procedure: COLONOSCOPY WITH PROPOFOL;  Surgeon: Malissa Hippo, MD;  [facility];  Service: Endoscopy;  Laterality: N/A;  10:55    Current Medications: Current Meds  Medication Sig   allopurinol (ZYLOPRIM) 100 MG tablet Take 100 mg by mouth daily.   amLODipine (NORVASC) 10 MG tablet Take 10 mg by mouth daily.   aspirin EC 81 MG tablet Take 81 mg by mouth daily.   atorvastatin (LIPITOR) 80 MG tablet Take 80 mg by mouth daily.   carvedilol (COREG) 25 MG tablet Take 25 mg by mouth 2 (two) times daily.   Cholecalciferol (VITAMIN D3) 50 MCG (2000 UT) TABS Take 2,000 Units by mouth daily.   DULoxetine (CYMBALTA) 30 MG capsule Take 30 mg by  mouth daily.   Fiber Adult Gummies 2 g CHEW Chew 2 g by mouth daily.   gabapentin (NEURONTIN) 300 MG capsule Take 300 mg by mouth 2 (two) times daily.   glipiZIDE (GLUCOTROL) 10 MG tablet Take 10 mg by mouth 2 (two) times daily before a meal.   hydrOXYzine (VISTARIL) 25 MG capsule Take 25 mg by mouth daily.   lisinopril (ZESTRIL) 40 MG tablet Take 40 mg by mouth daily.   metFORMIN (GLUCOPHAGE) 1000 MG tablet Take 1 tablet (1,000 mg total) by mouth 2 (two) times daily with a meal.   Multiple Vitamins-Minerals (MULTIVITAMIN WITH MINERALS) tablet Take 1 tablet by mouth daily. Centrum   polyethylene glycol-electrolytes (TRILYTE) 420 g solution Take 4,000 mLs by mouth as directed.   vitamin B-12 (CYANOCOBALAMIN) 1000 MCG tablet Take 1,000 mcg by mouth daily.   vitamin C (ASCORBIC ACID) 250 MG tablet Take 250 mg by mouth daily.   vitamin E 180 MG (400 UNITS) capsule Take 400 Units by mouth daily.     Allergies:   Patient has no known allergies.   Social History   Socioeconomic History   Marital status: Single    Spouse name: Not on file   Number of children: Not on file   Years of education: Not on file  Highest education level: Not on file  Occupational History   Not on file  Tobacco Use   Smoking status: Never   Smokeless tobacco: Never  Vaping Use   Vaping Use: Never used  Substance and Sexual Activity   Alcohol use: No    Alcohol/week: 0.0 standard drinks   Drug use: No   Sexual activity: Not on file  Other Topics Concern   Not on file  Social History Narrative   Not on file   Social Determinants of Health   Financial Resource Strain: Not on file  Food Insecurity: Not on file  Transportation Needs: Not on file  Physical Activity: Not on file  Stress: Not on file  Social Connections: Not on file     Family History:  The patient's  family history includes CVA in her mother; Cancer in her brother, father, maternal grandfather, maternal grandmother, paternal grandfather,  and paternal grandmother; Diabetes in her maternal grandmother and mother; Hypertension in her mother and sister.   ROS:   Please see the history of present illness.    ROS All other systems reviewed and are negative.   PHYSICAL EXAM:   VS:  BP 124/78   Pulse 82   Ht 5\' 2"  (1.575 m)   Wt 228 lb (103.4 kg)   SpO2 95%   BMI 41.70 kg/m   Physical Exam  GEN: Obese, in no acute distress  Neck: no JVD, carotid bruits, or masses Cardiac:RRR; no murmurs, rubs, or gallops  Respiratory:  clear to auscultation bilaterally, normal work of breathing GI: soft, nontender, nondistended, + BS Ext: without cyanosis, clubbing, or edema, Good distal pulses bilaterally Neuro:  Alert and Oriented x 3, Psych: euthymic mood, full affect  Wt Readings from Last 3 Encounters:  01/09/22 228 lb (103.4 kg)  03/15/21 231 lb 12.8 oz (105.1 kg)  03/07/21 235 lb 14.3 oz (107 kg)      Studies/Labs Reviewed:   EKG:  EKG is not ordered today.     Recent Labs: 03/15/2021: BUN 22; Creatinine, Ser 0.93; Potassium 4.2; Sodium 135   Lipid Panel    Component Value Date/Time   CHOL 190 01/24/2015 0523   TRIG 145 01/24/2015 0523   HDL 56 01/24/2015 0523   CHOLHDL 3.4 01/24/2015 0523   VLDL 29 01/24/2015 0523   LDLCALC 105 (H) 01/24/2015 0523    Additional studies/ records that were reviewed today include:  Echo 04/06/21  IMPRESSIONS     1. Left ventricular ejection fraction, by estimation, is 60 to 65%. The  left ventricle has normal function. The left ventricle has no regional  wall motion abnormalities. There is mild left ventricular hypertrophy.  Left ventricular diastolic parameters  are consistent with Grade I diastolic dysfunction (impaired relaxation).   2. Right ventricular systolic function is normal. The right ventricular  size is normal. There is normal pulmonary artery systolic pressure. The  estimated right ventricular systolic pressure is [id number] mmHg.   3. There is a trivial pericardial  effusion posterior to the left  ventricle.   4. The mitral valve is abnormal. Mild mitral valve regurgitation.  Moderate mitral annular calcification.   5. The aortic valve is tricuspid. There is moderate calcification of the  aortic valve. Aortic valve regurgitation is not visualized. Aortic valve  area, by VTI measures 1.94 cm. Aortic valve mean gradient measures 7.0  mmHg. Aortic valve Vmax measures  1.96 m/s. Moderately sclerotic to mildly stenotic aortic valve.   6. The inferior vena cava is  normal in size with greater than 50%  respiratory variability, suggesting right atrial pressure of 3 mmHg.   Comparison(s): No prior Echocardiogram.   FINDINGS   Left Ventricle: Left ventricular ejection fraction, by estimation, is 60  to 65%. The left ventricle has normal function. The left ventricle has no  regional wall motion abnormalities. The left ventricular internal cavity  size was normal in size. There is   mild left ventricular hypertrophy. Left ventricular diastolic parameters  are consistent with Grade I diastolic dysfunction (impaired relaxation).   Right Ventricle: The right ventricular size is normal. No increase in  right ventricular wall thickness. Right ventricular systolic function is  normal. There is normal pulmonary artery systolic pressure. The tricuspid  regurgitant velocity is 2.57 m/s, and   with an assumed right atrial pressure of 3 mmHg, the estimated right  ventricular systolic pressure is [id number] mmHg.   Left Atrium: Left atrial size was normal in size.   Right Atrium: Right atrial size was normal in size.   Pericardium: Trivial pericardial effusion is present. The pericardial  effusion is posterior to the left ventricle.   Mitral Valve: The mitral valve is abnormal. Moderate mitral annular  calcification. Mild mitral valve regurgitation.   Tricuspid Valve: The tricuspid valve is grossly normal. Tricuspid valve  regurgitation is mild.   Aortic Valve: The  aortic valve is tricuspid. There is moderate  calcification of the aortic valve. There is moderate aortic valve annular  calcification. Aortic valve regurgitation is not visualized. Aortic valve  mean gradient measures 7.0 mmHg. Aortic  valve peak gradient measures 15.4 mmHg. Aortic valve area, by VTI measures  1.94 cm.   Pulmonic Valve: The pulmonic valve was grossly normal. Pulmonic valve  regurgitation is trivial.   Aorta: The aortic root is normal in size and structure.   Venous: The inferior vena cava is normal in size with greater than 50%  respiratory variability, suggesting right atrial pressure of 3 mmHg.   IAS/Shunts: No atrial level shunt detected by color flow Doppler.    Risk Assessment/Calculations:         ASSESSMENT:    1. NICM (nonischemic cardiomyopathy) ([place])   2. Essential hypertension   3. Hyperlipidemia, unspecified hyperlipidemia type   4. Nonrheumatic aortic valve stenosis   5. History of CVA (cerebrovascular accident)      PLAN:  In order of problems listed above:  NICM normalized EF 60-65% on echo 03/2021- compensated and doing well on norvasc, coreg, lisinopril   HTN well controlled  HLD on lipitor for FLP and labs with PCP next month  Mild AS on echo 03/2021   History of CVA on ASA  Shared Decision Making/Informed Consent        Medication Adjustments/Labs and Tests Ordered: Current medicines are reviewed at length with the patient today.  Concerns regarding medicines are outlined above.  Medication changes, Labs and Tests ordered today are listed in the Patient Instructions below. Patient Instructions  Medication Instructions:  Your physician recommends that you continue on your current medications as directed. Please refer to the Current Medication list given to you today.   Labwork: None today  Testing/Procedures: None today  Follow-Up: 1 year  Any Other Special Instructions Will Be Listed Below (If  Applicable).   Exercise 150 minutes minutes a week - 30 minutes, 5 days a week-  If you need a refill on your cardiac medications before your next appointment, please call your pharmacy.    Signed, Ermalinda Barrios, PA-C  01/09/2022 1:48 PM    [facility] [place], [place], [place]  [address] Phone: [phone]; Fax: [phone]

## 2022-01-09 ENCOUNTER — Encounter: Payer: Self-pay | Admitting: Physician Assistant

## 2022-01-09 ENCOUNTER — Ambulatory Visit (INDEPENDENT_AMBULATORY_CARE_PROVIDER_SITE_OTHER): Payer: Medicare Other | Admitting: Physician Assistant

## 2022-01-09 VITALS — BP 124/78 | HR 82 | Ht 62.0 in | Wt 228.0 lb

## 2022-01-09 DIAGNOSIS — I10 Essential (primary) hypertension: Secondary | ICD-10-CM | POA: Diagnosis not present

## 2022-01-09 DIAGNOSIS — E785 Hyperlipidemia, unspecified: Secondary | ICD-10-CM | POA: Diagnosis not present

## 2022-01-09 DIAGNOSIS — I350 Nonrheumatic aortic (valve) stenosis: Secondary | ICD-10-CM

## 2022-01-09 DIAGNOSIS — Z8673 Personal history of transient ischemic attack (TIA), and cerebral infarction without residual deficits: Secondary | ICD-10-CM

## 2022-01-09 DIAGNOSIS — I428 Other cardiomyopathies: Secondary | ICD-10-CM | POA: Diagnosis not present

## 2022-01-09 NOTE — Patient Instructions (Signed)
Medication Instructions:  Your physician recommends that you continue on your current medications as directed. Please refer to the Current Medication list given to you today.   Labwork: None today  Testing/Procedures: None today  Follow-Up: 1 year  Any Other Special Instructions Will Be Listed Below (If Applicable).   Exercise 150 minutes minutes a week - 30 minutes, 5 days a week-  If you need a refill on your cardiac medications before your next appointment, please call your pharmacy.

## 2022-03-27 DIAGNOSIS — R42 Dizziness and giddiness: Secondary | ICD-10-CM | POA: Diagnosis not present

## 2022-03-27 DIAGNOSIS — H1013 Acute atopic conjunctivitis, bilateral: Secondary | ICD-10-CM | POA: Diagnosis not present

## 2022-03-27 DIAGNOSIS — W010XXA Fall on same level from slipping, tripping and stumbling without subsequent striking against object, initial encounter: Secondary | ICD-10-CM | POA: Diagnosis not present

## 2022-03-27 DIAGNOSIS — R519 Headache, unspecified: Secondary | ICD-10-CM | POA: Diagnosis not present

## 2022-05-30 ENCOUNTER — Other Ambulatory Visit (HOSPITAL_COMMUNITY): Payer: Self-pay | Admitting: Family Medicine

## 2022-05-30 DIAGNOSIS — R928 Other abnormal and inconclusive findings on diagnostic imaging of breast: Secondary | ICD-10-CM | POA: Diagnosis not present

## 2022-05-30 DIAGNOSIS — E782 Mixed hyperlipidemia: Secondary | ICD-10-CM | POA: Diagnosis not present

## 2022-05-30 DIAGNOSIS — N189 Chronic kidney disease, unspecified: Secondary | ICD-10-CM | POA: Diagnosis not present

## 2022-05-30 DIAGNOSIS — R809 Proteinuria, unspecified: Secondary | ICD-10-CM | POA: Diagnosis not present

## 2022-05-30 DIAGNOSIS — M1A09X0 Idiopathic chronic gout, multiple sites, without tophus (tophi): Secondary | ICD-10-CM | POA: Diagnosis not present

## 2022-05-30 DIAGNOSIS — Z713 Dietary counseling and surveillance: Secondary | ICD-10-CM | POA: Diagnosis not present

## 2022-05-30 DIAGNOSIS — I5032 Chronic diastolic (congestive) heart failure: Secondary | ICD-10-CM | POA: Diagnosis not present

## 2022-05-30 DIAGNOSIS — Z1231 Encounter for screening mammogram for malignant neoplasm of breast: Secondary | ICD-10-CM

## 2022-05-30 DIAGNOSIS — I10 Essential (primary) hypertension: Secondary | ICD-10-CM | POA: Diagnosis not present

## 2022-05-30 DIAGNOSIS — E119 Type 2 diabetes mellitus without complications: Secondary | ICD-10-CM | POA: Diagnosis not present

## 2022-05-30 DIAGNOSIS — Z1239 Encounter for other screening for malignant neoplasm of breast: Secondary | ICD-10-CM | POA: Diagnosis not present

## 2022-05-30 LAB — HM DIABETES FOOT EXAM: HM Diabetic Foot Exam: NORMAL

## 2022-05-30 LAB — LIPID PANEL
Cholesterol: 163 (ref 0–200)
LDL Cholesterol: 80
Triglycerides: 91 (ref 40–160)

## 2022-06-10 ENCOUNTER — Ambulatory Visit (HOSPITAL_COMMUNITY)
Admission: RE | Admit: 2022-06-10 | Discharge: 2022-06-10 | Disposition: A | Discharge status: Home / Self Care | Payer: Medicare Other | Source: Ambulatory Visit | Attending: Family Medicine | Admitting: Family Medicine

## 2022-06-10 DIAGNOSIS — Z1231 Encounter for screening mammogram for malignant neoplasm of breast: Secondary | ICD-10-CM | POA: Insufficient documentation

## 2022-06-14 LAB — HM MAMMOGRAPHY

## 2022-09-30 LAB — BASIC METABOLIC PANEL: Creatinine: 1 (ref 0.5–1.1)

## 2022-09-30 LAB — COMPREHENSIVE METABOLIC PANEL: eGFR: 61

## 2022-10-01 DIAGNOSIS — E559 Vitamin D deficiency, unspecified: Secondary | ICD-10-CM | POA: Diagnosis not present

## 2022-10-01 DIAGNOSIS — E119 Type 2 diabetes mellitus without complications: Secondary | ICD-10-CM | POA: Diagnosis not present

## 2022-10-01 DIAGNOSIS — N2581 Secondary hyperparathyroidism of renal origin: Secondary | ICD-10-CM | POA: Diagnosis not present

## 2022-10-01 DIAGNOSIS — N189 Chronic kidney disease, unspecified: Secondary | ICD-10-CM | POA: Diagnosis not present

## 2022-10-01 DIAGNOSIS — E538 Deficiency of other specified B group vitamins: Secondary | ICD-10-CM | POA: Diagnosis not present

## 2022-10-01 DIAGNOSIS — I10 Essential (primary) hypertension: Secondary | ICD-10-CM | POA: Diagnosis not present

## 2022-10-02 LAB — HEMOGLOBIN A1C: Hemoglobin A1C: 6.5

## 2022-10-02 LAB — MICROALBUMIN, URINE: Microalb, Ur: >42

## 2022-10-09 DIAGNOSIS — E782 Mixed hyperlipidemia: Secondary | ICD-10-CM | POA: Diagnosis not present

## 2022-10-09 DIAGNOSIS — E559 Vitamin D deficiency, unspecified: Secondary | ICD-10-CM | POA: Diagnosis not present

## 2022-10-09 DIAGNOSIS — R238 Other skin changes: Secondary | ICD-10-CM | POA: Diagnosis not present

## 2022-10-09 DIAGNOSIS — E119 Type 2 diabetes mellitus without complications: Secondary | ICD-10-CM | POA: Diagnosis not present

## 2022-10-09 DIAGNOSIS — I5032 Chronic diastolic (congestive) heart failure: Secondary | ICD-10-CM | POA: Diagnosis not present

## 2022-10-09 DIAGNOSIS — N2581 Secondary hyperparathyroidism of renal origin: Secondary | ICD-10-CM | POA: Diagnosis not present

## 2022-10-09 DIAGNOSIS — R809 Proteinuria, unspecified: Secondary | ICD-10-CM | POA: Diagnosis not present

## 2022-10-09 DIAGNOSIS — I10 Essential (primary) hypertension: Secondary | ICD-10-CM | POA: Diagnosis not present

## 2022-10-09 DIAGNOSIS — E538 Deficiency of other specified B group vitamins: Secondary | ICD-10-CM | POA: Diagnosis not present

## 2022-10-09 DIAGNOSIS — M1A09X0 Idiopathic chronic gout, multiple sites, without tophus (tophi): Secondary | ICD-10-CM | POA: Diagnosis not present

## 2022-10-09 DIAGNOSIS — N189 Chronic kidney disease, unspecified: Secondary | ICD-10-CM | POA: Diagnosis not present

## 2022-10-30 ENCOUNTER — Encounter (HOSPITAL_COMMUNITY): Payer: Self-pay | Admitting: Pharmacy Technician

## 2022-10-30 ENCOUNTER — Emergency Department (HOSPITAL_COMMUNITY)
Admission: EM | Admit: 2022-10-30 | Discharge: 2022-10-30 | Disposition: A | Discharge status: Home / Self Care | Payer: 59 | Attending: Emergency Medicine | Admitting: Emergency Medicine

## 2022-10-30 ENCOUNTER — Emergency Department (HOSPITAL_COMMUNITY): Payer: 59

## 2022-10-30 ENCOUNTER — Other Ambulatory Visit: Payer: Self-pay

## 2022-10-30 DIAGNOSIS — L03316 Cellulitis of umbilicus: Secondary | ICD-10-CM | POA: Diagnosis not present

## 2022-10-30 DIAGNOSIS — I2510 Atherosclerotic heart disease of native coronary artery without angina pectoris: Secondary | ICD-10-CM | POA: Diagnosis not present

## 2022-10-30 DIAGNOSIS — E119 Type 2 diabetes mellitus without complications: Secondary | ICD-10-CM | POA: Insufficient documentation

## 2022-10-30 DIAGNOSIS — R109 Unspecified abdominal pain: Secondary | ICD-10-CM | POA: Diagnosis not present

## 2022-10-30 DIAGNOSIS — N83202 Unspecified ovarian cyst, left side: Secondary | ICD-10-CM | POA: Insufficient documentation

## 2022-10-30 DIAGNOSIS — I10 Essential (primary) hypertension: Secondary | ICD-10-CM | POA: Insufficient documentation

## 2022-10-30 DIAGNOSIS — Z7984 Long term (current) use of oral hypoglycemic drugs: Secondary | ICD-10-CM | POA: Insufficient documentation

## 2022-10-30 DIAGNOSIS — Z79899 Other long term (current) drug therapy: Secondary | ICD-10-CM | POA: Diagnosis not present

## 2022-10-30 DIAGNOSIS — Z7982 Long term (current) use of aspirin: Secondary | ICD-10-CM | POA: Insufficient documentation

## 2022-10-30 DIAGNOSIS — L03311 Cellulitis of abdominal wall: Secondary | ICD-10-CM | POA: Diagnosis not present

## 2022-10-30 LAB — COMPREHENSIVE METABOLIC PANEL
ALT: 22 U/L (ref 0–44)
AST: 25 U/L (ref 15–41)
Albumin: 3.7 g/dL (ref 3.5–5.0)
Alkaline Phosphatase: 58 U/L (ref 38–126)
Anion gap: 12 (ref 5–15)
BUN: 19 mg/dL (ref 8–23)
CO2: 25 mmol/L (ref 22–32)
Calcium: 9 mg/dL (ref 8.9–10.3)
Chloride: 104 mmol/L (ref 98–111)
Creatinine, Ser: 1.1 mg/dL — ABNORMAL HIGH (ref 0.44–1.00)
GFR, Estimated: 56 mL/min — ABNORMAL LOW (ref >60)
Glucose, Bld: 119 mg/dL — ABNORMAL HIGH (ref 70–99)
Potassium: 4.3 mmol/L (ref 3.5–5.1)
Sodium: 141 mmol/L (ref 135–145)
Total Bilirubin: 0.5 mg/dL (ref 0.3–1.2)
Total Protein: 7.3 g/dL (ref 6.5–8.1)

## 2022-10-30 LAB — CBC WITH DIFFERENTIAL/PLATELET
Abs Immature Granulocytes: 0 10*3/uL (ref 0.00–0.07)
Basophils Absolute: 0.1 10*3/uL (ref 0.0–0.1)
Basophils Relative: 1 %
Eosinophils Absolute: 0.1 10*3/uL (ref 0.0–0.5)
Eosinophils Relative: 2 %
HCT: 39.7 % (ref 36.0–46.0)
Hemoglobin: 12.6 g/dL (ref 12.0–15.0)
Immature Granulocytes: 0 %
Lymphocytes Relative: 54 %
Lymphs Abs: 4.4 10*3/uL — ABNORMAL HIGH (ref 0.7–4.0)
MCH: 27.6 pg (ref 26.0–34.0)
MCHC: 31.7 g/dL (ref 30.0–36.0)
MCV: 86.9 fL (ref 80.0–100.0)
Monocytes Absolute: 0.6 10*3/uL (ref 0.1–1.0)
Monocytes Relative: 7 %
Neutro Abs: 2.9 10*3/uL (ref 1.7–7.7)
Neutrophils Relative %: 36 %
Platelets: 281 10*3/uL (ref 150–400)
RBC: 4.57 MIL/uL (ref 3.87–5.11)
RDW: 16.7 % — ABNORMAL HIGH (ref 11.5–15.5)
WBC: 8.1 10*3/uL (ref 4.0–10.5)
nRBC: 0 % (ref 0.0–0.2)

## 2022-10-30 LAB — LIPASE, BLOOD: Lipase: 39 U/L (ref 11–51)

## 2022-10-30 MED ORDER — IOHEXOL 300 MG/ML  SOLN
100.0000 mL | Freq: Once | INTRAMUSCULAR | Status: AC | PRN
  Administered 2022-10-30: 100 mL via INTRAVENOUS

## 2022-10-30 MED ORDER — DOXYCYCLINE HYCLATE 100 MG PO TABS
100.0000 mg | ORAL_TABLET | Freq: Once | ORAL | Status: AC
  Administered 2022-10-30: 100 mg via ORAL
  Filled 2022-10-30: qty 1

## 2022-10-30 MED ORDER — DOXYCYCLINE HYCLATE 100 MG PO CAPS: 100.0000 mg | ORAL_CAPSULE | Freq: Two times a day (BID) | ORAL | 0 refills | Status: DC

## 2022-10-30 NOTE — ED Triage Notes (Signed)
Pt here with reports of a sore inside her naval X3-4 weeks. Endorses bloody drainage on occasion from site.

## 2022-10-30 NOTE — ED Provider Notes (Signed)
[facility] Provider Note   CSN: [id number] Arrival date & time: 10/30/22  [id number]     History  No chief complaint on file.   Crystal Snow is a 62 y.o. female.  Pt is a 62 yo female with pmhx significant for htn, dm2, cva, hld, obesity, and gout.  Pt has had drainage from her naval for 3-4 weeks.  It is bloody and smells.  She has some abd pain as well.  No other sx.       Home Medications Prior to Admission medications   Medication Sig Start Date End Date Taking? Authorizing Provider  allopurinol (ZYLOPRIM) 100 MG tablet Take 100 mg by mouth daily. 11/18/16  Yes [provider]  amLODipine (NORVASC) 10 MG tablet Take 10 mg by mouth daily. 11/18/16  Yes [provider]  aspirin EC 81 MG tablet Take 81 mg by mouth daily.   Yes [provider]  atorvastatin (LIPITOR) 80 MG tablet Take 80 mg by mouth daily.   Yes [provider]  carvedilol (COREG) 25 MG tablet Take 25 mg by mouth 2 (two) times daily. 03/13/21  Yes [provider]  Cholecalciferol (VITAMIN D3) 50 MCG (2000 UT) TABS Take 2,000 Units by mouth daily.   Yes [provider]  doxycycline (VIBRAMYCIN) 100 MG capsule Take 1 capsule (100 mg total) by mouth 2 (two) times daily. 10/30/22  Yes Isla Pence, MD  DULoxetine (CYMBALTA) 30 MG capsule Take 30 mg by mouth daily. 02/18/21  Yes [provider]  Fiber Adult Gummies 2 g CHEW Chew 2 g by mouth daily.   Yes [provider]  gabapentin (NEURONTIN) 300 MG capsule Take 300 mg by mouth 2 (two) times daily.   Yes [provider]  glipiZIDE (GLUCOTROL) 10 MG tablet Take 10 mg by mouth 2 (two) times daily before a meal. 11/18/16  Yes [provider]  hydrOXYzine (VISTARIL) 25 MG capsule Take 25 mg by mouth daily. 01/31/21  Yes [provider]  lisinopril (ZESTRIL) 40 MG tablet Take 40 mg by mouth daily.   Yes [provider]   metFORMIN (GLUCOPHAGE) 1000 MG tablet Take 1 tablet (1,000 mg total) by mouth 2 (two) times daily with a meal. 10/04/15  Yes Caryl Ada K, PA-C  Multiple Vitamins-Minerals (MULTIVITAMIN WITH MINERALS) tablet Take 1 tablet by mouth daily. Centrum   Yes [provider]  vitamin B-12 (CYANOCOBALAMIN) 1000 MCG tablet Take 1,000 mcg by mouth daily.   Yes [provider]  vitamin C (ASCORBIC ACID) 250 MG tablet Take 250 mg by mouth daily.   Yes [provider]  vitamin E 180 MG (400 UNITS) capsule Take 400 Units by mouth daily.   Yes [provider]  polyethylene glycol-electrolytes (TRILYTE) 420 g solution Take 4,000 mLs by mouth as directed. Patient not taking: Reported on 10/30/2022 02/13/21   Rogene Houston, MD      Allergies    Patient has no known allergies.    Review of Systems   Review of Systems  Gastrointestinal:  Positive for abdominal pain.       D/c from umbilicus  All other systems reviewed and are negative.   Physical Exam Updated Vital Signs BP 130/75   Pulse 82   Temp 98.3 F (36.8 C) (Oral)   Resp 16   SpO2 94%  Physical Exam Vitals and nursing note reviewed.  Constitutional:      Appearance: Normal appearance. She  is obese.  HENT:     Head: Normocephalic and atraumatic.     Right Ear: External ear normal.     Left Ear: External ear normal.     Nose: Nose normal.     Mouth/Throat:     Mouth: Mucous membranes are moist.     Pharynx: Oropharynx is clear.  Eyes:     Extraocular Movements: Extraocular movements intact.     Conjunctiva/sclera: Conjunctivae normal.     Pupils: Pupils are equal, round, and reactive to light.  Cardiovascular:     Rate and Rhythm: Normal rate and regular rhythm.     Pulses: Normal pulses.     Heart sounds: Normal heart sounds.  Pulmonary:     Effort: Pulmonary effort is normal.     Breath sounds: Normal breath sounds.  Abdominal:     General: Abdomen is flat. Bowel sounds are normal.      Palpations: Abdomen is soft.     Comments: Blood inside umbilicus.  No abscess palpated.   Musculoskeletal:        General: Normal range of motion.     Cervical back: Normal range of motion and neck supple.  Skin:    General: Skin is warm.     Capillary Refill: Capillary refill takes less than 2 seconds.  Neurological:     General: No focal deficit present.     Mental Status: She is alert and oriented to person, place, and time.  Psychiatric:        Mood and Affect: Mood normal.        Behavior: Behavior normal.     ED Results / Procedures / Treatments   Labs (all labs ordered are listed, but only abnormal results are displayed) Labs Reviewed  CBC WITH DIFFERENTIAL/PLATELET - Abnormal; Notable for the following components:      Result Value   RDW 16.7 (*)    Lymphs Abs 4.4 (*)    All other components within normal limits  COMPREHENSIVE METABOLIC PANEL - Abnormal; Notable for the following components:   Glucose, Bld 119 (*)    Creatinine, Ser 1.10 (*)    GFR, Estimated 56 (*)    All other components within normal limits  LIPASE, BLOOD  URINALYSIS, ROUTINE W REFLEX MICROSCOPIC    EKG None  Radiology US Pelvis Complete  Result Date: 10/30/2022 CLINICAL DATA:  LEFT adnexal mass, postmenopausal EXAM: TRANSABDOMINAL AND TRANSVAGINAL ULTRASOUND OF PELVIS DOPPLER ULTRASOUND OF OVARIES TECHNIQUE: Both transabdominal and transvaginal ultrasound examinations of the pelvis were performed. Transabdominal technique was performed for global imaging of the pelvis including uterus, ovaries, adnexal regions, and pelvic cul-de-sac. Image quality degraded secondary to body habitus and bowel. It was necessary to proceed with endovaginal exam following the transabdominal exam to visualize the ovaries and adnexa. Color and duplex Doppler ultrasound was utilized to evaluate blood flow to the ovaries. COMPARISON:  CT abdomen and pelvis 10/30/2022 FINDINGS: Uterus Surgically absent Endometrium  Surgically absent Right ovary Not visualized, nor visualized on earlier CT, question surgically absent Left ovary Measurements: 5.1 x 3.6 x 3.8 cm = volume 36.1 mL. LEFT ovary replaced by a heterogeneous solid-appearing mass which contains internal blood flow on color Doppler imaging. Mass contains areas of hypoechogenicity as well as significant hyperechogenicity, some associated with shadowing. Largest area of increased echogenicity measures approximately 3.1 cm diameter. By CT mass contains only a single small calcification 5 mm diameter. Potentially this may represent a combination of fat and calcification. No gross fat is  seen by CT however. Possibility of a dermoid tumor of the LEFT ovary is raised. Pulsed Doppler evaluation of demonstrates normal low-resistance arterial and venous waveforms within the LEFT ovary/LEFT ovarian mass. Other findings Trace nonspecific free pelvic fluid.  No additional masses. IMPRESSION: Surgical absence of uterus with nonvisualization of RIGHT ovary, question prior RIGHT oophorectomy. Heterogeneous solid appearing mass replacing the LEFT ovary 5.1 x 3.6 x 3.8 cm, containing internal blood flow on color Doppler imaging, containing areas of hypoechogenicity as well as significant hyperechogenicity, some associated with shadowing, and scattered areas of increased echogenicity. Potentially this may represent combination of fat and calcification, raising question of a dermoid tumor; consider follow-up characterization by MR imaging with and without contrast. No additional pelvic sonographic abnormalities or evidence of LEFT ovarian torsion. Electronically Signed   By: Lavonia Dana M.D.   On: 10/30/2022 11:09   US Transvaginal Non-OB  Result Date: 10/30/2022 CLINICAL DATA:  LEFT adnexal mass, postmenopausal EXAM: TRANSABDOMINAL AND TRANSVAGINAL ULTRASOUND OF PELVIS DOPPLER ULTRASOUND OF OVARIES TECHNIQUE: Both transabdominal and transvaginal ultrasound examinations of the pelvis  were performed. Transabdominal technique was performed for global imaging of the pelvis including uterus, ovaries, adnexal regions, and pelvic cul-de-sac. Image quality degraded secondary to body habitus and bowel. It was necessary to proceed with endovaginal exam following the transabdominal exam to visualize the ovaries and adnexa. Color and duplex Doppler ultrasound was utilized to evaluate blood flow to the ovaries. COMPARISON:  CT abdomen and pelvis 10/30/2022 FINDINGS: Uterus Surgically absent Endometrium Surgically absent Right ovary Not visualized, nor visualized on earlier CT, question surgically absent Left ovary Measurements: 5.1 x 3.6 x 3.8 cm = volume 36.1 mL. LEFT ovary replaced by a heterogeneous solid-appearing mass which contains internal blood flow on color Doppler imaging. Mass contains areas of hypoechogenicity as well as significant hyperechogenicity, some associated with shadowing. Largest area of increased echogenicity measures approximately 3.1 cm diameter. By CT mass contains only a single small calcification 5 mm diameter. Potentially this may represent a combination of fat and calcification. No gross fat is seen by CT however. Possibility of a dermoid tumor of the LEFT ovary is raised. Pulsed Doppler evaluation of demonstrates normal low-resistance arterial and venous waveforms within the LEFT ovary/LEFT ovarian mass. Other findings Trace nonspecific free pelvic fluid.  No additional masses. IMPRESSION: Surgical absence of uterus with nonvisualization of RIGHT ovary, question prior RIGHT oophorectomy. Heterogeneous solid appearing mass replacing the LEFT ovary 5.1 x 3.6 x 3.8 cm, containing internal blood flow on color Doppler imaging, containing areas of hypoechogenicity as well as significant hyperechogenicity, some associated with shadowing, and scattered areas of increased echogenicity. Potentially this may represent combination of fat and calcification, raising question of a dermoid  tumor; consider follow-up characterization by MR imaging with and without contrast. No additional pelvic sonographic abnormalities or evidence of LEFT ovarian torsion. Electronically Signed   By: Lavonia Dana M.D.   On: 10/30/2022 11:09   [place] Art/Ven Flow Abd Pelv Doppler  Result Date: 10/30/2022 CLINICAL DATA:  LEFT adnexal mass, postmenopausal EXAM: TRANSABDOMINAL AND TRANSVAGINAL ULTRASOUND OF PELVIS DOPPLER ULTRASOUND OF OVARIES TECHNIQUE: Both transabdominal and transvaginal ultrasound examinations of the pelvis were performed. Transabdominal technique was performed for global imaging of the pelvis including uterus, ovaries, adnexal regions, and pelvic cul-de-sac. Image quality degraded secondary to body habitus and bowel. It was necessary to proceed with endovaginal exam following the transabdominal exam to visualize the ovaries and adnexa. Color and duplex Doppler ultrasound was utilized to evaluate blood flow  to the ovaries. COMPARISON:  CT abdomen and pelvis 10/30/2022 FINDINGS: Uterus Surgically absent Endometrium Surgically absent Right ovary Not visualized, nor visualized on earlier CT, question surgically absent Left ovary Measurements: 5.1 x 3.6 x 3.8 cm = volume 36.1 mL. LEFT ovary replaced by a heterogeneous solid-appearing mass which contains internal blood flow on color Doppler imaging. Mass contains areas of hypoechogenicity as well as significant hyperechogenicity, some associated with shadowing. Largest area of increased echogenicity measures approximately 3.1 cm diameter. By CT mass contains only a single small calcification 5 mm diameter. Potentially this may represent a combination of fat and calcification. No gross fat is seen by CT however. Possibility of a dermoid tumor of the LEFT ovary is raised. Pulsed Doppler evaluation of demonstrates normal low-resistance arterial and venous waveforms within the LEFT ovary/LEFT ovarian mass. Other findings Trace nonspecific free pelvic fluid.  No  additional masses. IMPRESSION: Surgical absence of uterus with nonvisualization of RIGHT ovary, question prior RIGHT oophorectomy. Heterogeneous solid appearing mass replacing the LEFT ovary 5.1 x 3.6 x 3.8 cm, containing internal blood flow on color Doppler imaging, containing areas of hypoechogenicity as well as significant hyperechogenicity, some associated with shadowing, and scattered areas of increased echogenicity. Potentially this may represent combination of fat and calcification, raising question of a dermoid tumor; consider follow-up characterization by MR imaging with and without contrast. No additional pelvic sonographic abnormalities or evidence of LEFT ovarian torsion. Electronically Signed   By: Lavonia Dana M.D.   On: 10/30/2022 11:09   CT ABDOMEN PELVIS W CONTRAST  Result Date: 10/30/2022 CLINICAL DATA:  Abdominal pain, acute, nonlocalized. Patient reports a sore navel with blood at times for 3-4 weeks. EXAM: CT ABDOMEN AND PELVIS WITH CONTRAST TECHNIQUE: Multidetector CT imaging of the abdomen and pelvis was performed using the standard protocol following bolus administration of intravenous contrast. RADIATION DOSE REDUCTION: This exam was performed according to the departmental dose-optimization program which includes automated exposure control, adjustment of the mA and/or kV according to patient size and/or use of iterative reconstruction technique. CONTRAST:  161m OMNIPAQUE IOHEXOL 300 MG/ML  SOLN COMPARISON:  Limited correlation made with abdominal ultrasound 07/12/2005 FINDINGS: Lower chest: Mild atelectasis at both lung bases. There is atherosclerosis of the aorta and coronary arteries. There are calcifications of the aortic valve. Hepatobiliary: The liver is normal in density without suspicious focal abnormality. No evidence of biliary dilatation post cholecystectomy. Pancreas: Unremarkable. No pancreatic ductal dilatation or surrounding inflammatory changes. Spleen: Normal in size  without focal abnormality. Adrenals/Urinary Tract: Both adrenal glands appear normal. Possible punctate nonobstructing calculus in the lower pole of the right kidney, best seen on the coronal images. No other evidence of urinary tract calculus, hydronephrosis, perinephric soft tissue stranding or focal renal lesion. The bladder appears unremarkable for its degree of distention. Stomach/Bowel: No enteric contrast administered. The stomach appears unremarkable for its degree of distension. No evidence of bowel wall thickening, distention or surrounding inflammatory change. Retrocecal surgical clips suggesting previous appendectomy. Vascular/Lymphatic: There are no enlarged abdominal or pelvic lymph nodes. Mild aortic and branch vessel atherosclerosis without evidence of aneurysm or large vessel occlusion. Reproductive: Status post hysterectomy. There is a left adnexal mass which measures 5.6 x 3.7 cm on image 67/2 and 3.5 cm on sagittal image 79/8. This is mildly heterogeneous in density with a mean density of 64 HU. No associated calcifications or surrounding inflammatory changes. No right adnexal mass. Other: Postsurgical changes in the anterior abdominal wall with a 4.4 cm supraumbilical hernia containing only  fat. There is mild nonspecific dermal thickening around the umbilicus without focal fluid collection, soft tissue emphysema or foreign body. No ascites or free intraperitoneal air. Musculoskeletal: There are chronic bilateral L5 pars defects with 9 mm anterolisthesis and moderate to severe foraminal narrowing bilaterally at L5-S1. No acute osseous findings are evident. IMPRESSION: 1. Indeterminate 5.6 cm left adnexal mass status post hysterectomy, potentially neoplasm. Recommend further evaluation with pelvic ultrasound. 2. Postsurgical changes in the anterior abdominal wall with a supraumbilical hernia containing only fat. Nonspecific dermal thickening around the umbilicus without focal fluid collection,  soft tissue emphysema or foreign body. 3. 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 4. Possible punctate nonobstructing calculus in the lower pole of the right kidney. No evidence of ureteral calculus or hydronephrosis. 5.  Aortic Atherosclerosis (ICD10-I70.0). Electronically Signed   By: Richardean Sale M.D.   On: 10/30/2022 09:09    Procedures Procedures    Medications Ordered in ED Medications  doxycycline (VIBRA-TABS) tablet 100 mg (has no administration in time range)  iohexol (OMNIPAQUE) 300 MG/ML solution 100 mL (100 mLs Intravenous Contrast Given 10/30/22 [id number])    ED Course/ Medical Decision Making/ A&P                             Medical Decision Making Amount and/or Complexity of Data Reviewed Labs: ordered. Radiology: ordered.  Risk Prescription drug management.   This patient presents to the ED for concern of sore in abd, this involves an extensive number of treatment options, and is a complaint that carries with it a high risk of complications and morbidity.  The differential diagnosis includes superficial vs deep abscess   Co morbidities that complicate the patient evaluation  htn, dm2, cva, hld, obesity, and gout.  Pt has had drainage from her naval for 3-4 weeks   Additional history obtained:  Additional history obtained from epic chart review    Lab Tests:  I Ordered, and personally interpreted labs.  The pertinent results include:  cbc nl, cmp nl, lip nl   Imaging Studies ordered:  I ordered imaging studies including ct abd/pelvis and [place] I independently visualized and interpreted imaging which showed  CT abd/pelvis: ndeterminate 5.6 cm left adnexal mass status post hysterectomy,  potentially neoplasm. Recommend further evaluation with pelvic  ultrasound.  2. Postsurgical changes in the anterior abdominal wall with a  supraumbilical hernia containing only fat. Nonspecific  dermal  thickening around the umbilicus without focal fluid collection, soft  tissue emphysema or foreign body.  3. 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  4. Possible punctate nonobstructing calculus in the lower pole of  the right kidney. No evidence of ureteral calculus or  hydronephrosis.  5.  Aortic Atherosclerosis (ICD10-I70.0).  [place]: Surgical absence of uterus with nonvisualization of RIGHT ovary,  question prior RIGHT oophorectomy.    Heterogeneous solid appearing mass replacing the LEFT ovary 5.1 x  3.6 x 3.8 cm, containing internal blood flow on color Doppler  imaging, containing areas of hypoechogenicity as well as significant  hyperechogenicity, some associated with shadowing, and scattered  areas of increased echogenicity.    Potentially this may represent combination of fat and calcification,  raising question of a dermoid tumor; consider follow-up  characterization by MR imaging with and without contrast.    No additional pelvic sonographic abnormalities or evidence of  LEFT  ovarian torsion.   I agree with the radiologist interpretation   Cardiac Monitoring:  The patient was maintained on a cardiac monitor.  I personally viewed and interpreted the cardiac monitored which showed an underlying rhythm of: nsr   Medicines ordered and prescription drug management:  I ordered medication including doxy  for cellulitis  Reevaluation of the patient after these medicines showed that the patient improved I have reviewed the patients home medicines and have made adjustments as needed   Test Considered:  Ct/us  Problem List / ED Course:  Umbilical cellulitis:  no abscess.  Doxy started Left ovarian cyst:  ? Dermoid tumor.  No torsion.  Pt to f/u with obgyn.    Reevaluation:  After the interventions noted above, I reevaluated the patient and found that they have :improved   Social  Determinants of Health:  Lives at home   Dispostion:  After consideration of the diagnostic results and the patients response to treatment, I feel that the patent would benefit from discharge with outpatient f/u.  Return if worse..          Final Clinical Impression(s) / ED Diagnoses Final diagnoses:  Cellulitis of umbilicus  Cyst of left ovary    Rx / DC Orders ED Discharge Orders          Ordered    doxycycline (VIBRAMYCIN) 100 MG capsule  2 times daily        10/30/22 1142              Isla Pence, MD 10/30/22 1144

## 2022-11-13 ENCOUNTER — Ambulatory Visit (INDEPENDENT_AMBULATORY_CARE_PROVIDER_SITE_OTHER): Payer: 59 | Admitting: Internal Medicine

## 2022-11-13 ENCOUNTER — Encounter: Payer: Self-pay | Admitting: Internal Medicine

## 2022-11-13 VITALS — BP 114/74 | HR 98 | Ht 62.0 in | Wt 221.0 lb

## 2022-11-13 DIAGNOSIS — I429 Cardiomyopathy, unspecified: Secondary | ICD-10-CM | POA: Diagnosis not present

## 2022-11-13 DIAGNOSIS — E119 Type 2 diabetes mellitus without complications: Secondary | ICD-10-CM | POA: Diagnosis not present

## 2022-11-13 DIAGNOSIS — G629 Polyneuropathy, unspecified: Secondary | ICD-10-CM | POA: Diagnosis not present

## 2022-11-13 DIAGNOSIS — I10 Essential (primary) hypertension: Secondary | ICD-10-CM

## 2022-11-13 DIAGNOSIS — E1169 Type 2 diabetes mellitus with other specified complication: Secondary | ICD-10-CM | POA: Diagnosis not present

## 2022-11-13 DIAGNOSIS — Z8673 Personal history of transient ischemic attack (TIA), and cerebral infarction without residual deficits: Secondary | ICD-10-CM | POA: Diagnosis not present

## 2022-11-13 DIAGNOSIS — Z0001 Encounter for general adult medical examination with abnormal findings: Secondary | ICD-10-CM | POA: Diagnosis not present

## 2022-11-13 DIAGNOSIS — N182 Chronic kidney disease, stage 2 (mild): Secondary | ICD-10-CM

## 2022-11-13 DIAGNOSIS — E785 Hyperlipidemia, unspecified: Secondary | ICD-10-CM

## 2022-11-13 DIAGNOSIS — I350 Nonrheumatic aortic (valve) stenosis: Secondary | ICD-10-CM | POA: Diagnosis not present

## 2022-11-13 NOTE — Patient Instructions (Signed)
It was a pleasure to see you today.  Thank you for giving [place] the opportunity to be involved in your care.  Below is a brief recap of your visit and next steps.  We will plan to see you again in 3 months.  Summary You have established care today.  We will request records from [facility]  No medication changes today We will tentatively plan for follow up in 3 months.

## 2022-11-13 NOTE — Progress Notes (Signed)
New Patient Office Visit  Subjective    Patient ID: Crystal Snow, female    DOB: [birth year]  Age: 62 y.o. MRN: [id number]  CC:  Chief Complaint  Patient presents with   Establish Care    HPI Crystal Snow presents to establish care.  She is a 62 year old woman who endorses a past medical history significant for T2DM, HTN, peripheral neuropathy, CKD, HLD, prior CVA (2015), mild AS, and NICM.  She has most recently been followed by Dr. Tarry Kos at [place].  Crystal Snow reports feeling well today.  She is asymptomatic and has no acute concerns to discuss.  She is currently disabled secondary to chronic back pain and neuropathy.  She denies tobacco, alcohol, and illicit drug use.  Her family medical history is significant for diabetes mellitus, HTN, and unspecified cancer in her father, brother, and mother.  Chronic medical conditions and outstanding preventative care items discussed today are individually addressed A/P below.  Outpatient Encounter Medications as of 11/13/2022  Medication Sig   amLODipine (NORVASC) 10 MG tablet Take 10 mg by mouth daily.   aspirin EC 81 MG tablet Take 81 mg by mouth daily.   atorvastatin (LIPITOR) 80 MG tablet Take 80 mg by mouth daily.   carvedilol (COREG) 25 MG tablet Take 25 mg by mouth 2 (two) times daily.   Cholecalciferol (VITAMIN D3) 50 MCG (2000 UT) TABS Take 2,000 Units by mouth daily.   DULoxetine (CYMBALTA) 30 MG capsule Take 30 mg by mouth daily.   empagliflozin (JARDIANCE) 25 MG TABS tablet Take 25 mg by mouth daily.   Fiber Adult Gummies 2 g CHEW Chew 2 g by mouth daily.   gabapentin (NEURONTIN) 300 MG capsule Take 300 mg by mouth 2 (two) times daily.   glipiZIDE (GLUCOTROL) 10 MG tablet Take 10 mg by mouth 2 (two) times daily before a meal.   hydrOXYzine (VISTARIL) 25 MG capsule Take 25 mg by mouth daily.   lisinopril (ZESTRIL) 40 MG tablet Take 40 mg by mouth daily.   metFORMIN (GLUCOPHAGE) 1000 MG tablet  Take 1 tablet (1,000 mg total) by mouth 2 (two) times daily with a meal.   Multiple Vitamins-Minerals (MULTIVITAMIN WITH MINERALS) tablet Take 1 tablet by mouth daily. Centrum   Semaglutide,0.25 or 0.5MG /DOS, (OZEMPIC, 0.25 OR 0.5 MG/DOSE,) 2 MG/3ML SOPN Inject 0.5mg  weekly Subcutaneous for 90 days   vitamin B-12 (CYANOCOBALAMIN) 1000 MCG tablet Take 1,000 mcg by mouth daily.   vitamin C (ASCORBIC ACID) 250 MG tablet Take 250 mg by mouth daily.   [DISCONTINUED] allopurinol (ZYLOPRIM) 100 MG tablet Take 100 mg by mouth daily.   [DISCONTINUED] doxycycline (VIBRAMYCIN) 100 MG capsule Take 1 capsule (100 mg total) by mouth 2 (two) times daily.   [DISCONTINUED] vitamin E 180 MG (400 UNITS) capsule Take 400 Units by mouth daily.   [DISCONTINUED] polyethylene glycol-electrolytes (TRILYTE) 420 g solution Take 4,000 mLs by mouth as directed. (Patient not taking: Reported on 10/30/2022)   No facility-administered encounter medications on file as of 11/13/2022.    Past Medical History:  Diagnosis Date   Cholecystitis    lap chole 2006   CVA (cerebral infarction)    03/2014   Diabetes mellitus without complication    Dyslipidemia    Gout    Hypertension    Obesity (BMI 30-39.9)    Obstructive sleep apnea     Past Surgical History:  Procedure Laterality Date   COLONOSCOPY WITH PROPOFOL N/A 03/07/2021   Procedure: COLONOSCOPY WITH  PROPOFOL;  Surgeon: Rogene Houston, MD;  [facility];  Service: Endoscopy;  Laterality: N/A;  10:55   TOTAL ABDOMINAL HYSTERECTOMY      Family History  Problem Relation Age of Onset   Diabetes Mother    CVA Mother    Hypertension Mother    Cancer Father    Hypertension Sister    Cancer Brother    Cancer Maternal Grandmother    Diabetes Maternal Grandmother    Cancer Maternal Grandfather    Cancer Paternal Grandmother    Cancer Paternal Grandfather     Social History   Socioeconomic History   Marital status: Single    Spouse name: Not on  file   Number of children: Not on file   Years of education: Not on file   Highest education level: Not on file  Occupational History   Not on file  Tobacco Use   Smoking status: Never   Smokeless tobacco: Never  Vaping Use   Vaping Use: Never used  Substance and Sexual Activity   Alcohol use: No    Alcohol/week: 0.0 standard drinks of alcohol   Drug use: No   Sexual activity: Not on file  Other Topics Concern   Not on file  Social History Narrative   Not on file   Social Determinants of Health   Financial Resource Strain: Not on file  Food Insecurity: Not on file  Transportation Needs: Not on file  Physical Activity: Not on file  Stress: Not on file  Social Connections: Not on file  Intimate Partner Violence: Not on file   Review of Systems  Constitutional:  Negative for chills and fever.  HENT:  Negative for sore throat.   Respiratory:  Negative for cough and shortness of breath.   Cardiovascular:  Negative for chest pain, palpitations and leg swelling.  Gastrointestinal:  Negative for abdominal pain, blood in stool, constipation, diarrhea, nausea and vomiting.  Genitourinary:  Negative for dysuria and hematuria.  Musculoskeletal:  Negative for myalgias.  Skin:  Negative for itching and rash.  Neurological:  Negative for dizziness and headaches.  Psychiatric/Behavioral:  Negative for depression and suicidal ideas.     Objective    BP 114/74   Pulse 98   Ht 5\' 2"  (1.575 m)   Wt 221 lb (100.2 kg)   SpO2 94%   BMI 40.42 kg/m   Physical Exam Vitals reviewed.  Constitutional:      General: She is not in acute distress.    Appearance: Normal appearance. She is obese. She is not toxic-appearing.  HENT:     Head: Normocephalic and atraumatic.     Right Ear: External ear normal.     Left Ear: External ear normal.     Nose: Nose normal. No congestion or rhinorrhea.     Mouth/Throat:     Mouth: Mucous membranes are moist.     Pharynx: Oropharynx is clear. No  oropharyngeal exudate or posterior oropharyngeal erythema.  Eyes:     General: No scleral icterus.    Extraocular Movements: Extraocular movements intact.     Conjunctiva/sclera: Conjunctivae normal.     Pupils: Pupils are equal, round, and reactive to light.  Cardiovascular:     Rate and Rhythm: Normal rate and regular rhythm.     Pulses: Normal pulses.     Heart sounds: Murmur heard.     No friction rub. No gallop.  Pulmonary:     Effort: Pulmonary effort is normal.  Breath sounds: Normal breath sounds. No wheezing, rhonchi or rales.  Abdominal:     General: Abdomen is flat. Bowel sounds are normal. There is no distension.     Palpations: Abdomen is soft.     Tenderness: There is no abdominal tenderness.  Musculoskeletal:        General: No swelling. Normal range of motion.     Cervical back: Normal range of motion.     Right lower leg: No edema.     Left lower leg: No edema.  Lymphadenopathy:     Cervical: No cervical adenopathy.  Skin:    General: Skin is warm and dry.     Capillary Refill: Capillary refill takes less than 2 seconds.     Coloration: Skin is not jaundiced.  Neurological:     General: No focal deficit present.     Mental Status: She is alert and oriented to person, place, and time.  Psychiatric:        Mood and Affect: Mood normal.        Behavior: Behavior normal.    Assessment & Plan:   Problem List Items Addressed This Visit       Essential hypertension - Primary (Chronic)    Previously documented history of hypertension.  She is currently prescribed atenolol 25 mg twice daily, lisinopril 40 mg daily, and amlodipine 5 mg daily.  BP today is 114/74. -No medication changes today.  Continue current regimen.      Cardiomyopathy (Chronic)    Previously documented history of nonischemic cardiomyopathy.  EF normalized on echo from August 2022.  She is followed by cardiology and has an appointment scheduled for June.  Euvolemic on exam today. -No  medication changes today.  Continue carvedilol, lisinopril, and Jardiance      Mild aortic stenosis    Noted on most recent TTE.  Asymptomatic currently.  Followed by cardiology and has an appointment scheduled for June.      Type 2 diabetes mellitus without complication    Previously documented history of T2DM.  Last A1c 6.6 in February.  She is currently prescribed metformin 1000 mg twice daily, Ozempic 0.25 mg weekly, glipizide 10 mg twice daily, and Jardiance 25 mg daily. -No medication changes today -Repeat labs at follow-up in 3 months      Hyperlipidemia associated with type 2 diabetes mellitus    Currently prescribed atorvastatin 80 mg daily.  Lipid panel recently updated and is at goal. -No medication changes today      Neuropathy    She endorses a history of neuropathy and is prescribed gabapentin 300 mg twice daily as needed for pain relief as well as Cymbalta 30 mg daily. -No medication changes today      CKD (chronic kidney disease), stage II    CKD stage II based on most recent labs.  She is currently on Jardiance and lisinopril. -No medication changes today -Repeat labs at follow-up in 3 months      History of CVA (cerebrovascular accident)    Previously documented history of CVA (2015).  She is currently on ASA and statin therapy.  No residual deficits appreciated on exam today. -No medication changes today.  Continue ASA/statin      Encounter for general adult medical examination with abnormal findings    Presenting today to establish care.  Available records and labs have been reviewed. -Records from [place] have been requested -We will tentatively plan for follow-up in 3 months  Return in about 3 months (around 02/13/2023).   Johnette Abraham, MD

## 2022-11-18 ENCOUNTER — Encounter: Payer: Self-pay | Admitting: Internal Medicine

## 2022-11-18 ENCOUNTER — Ambulatory Visit (INDEPENDENT_AMBULATORY_CARE_PROVIDER_SITE_OTHER): Payer: 59 | Admitting: Internal Medicine

## 2022-11-18 DIAGNOSIS — Z0000 Encounter for general adult medical examination without abnormal findings: Secondary | ICD-10-CM

## 2022-11-18 NOTE — Progress Notes (Signed)
Subjective:  This is a telephone encounter between Crystal Snow and Crystal Snow on 11/18/2022 for AWV. The visit was conducted with the patient located at home and Crystal Snow at [facility]. The patient's identity was confirmed using their DOB and current address. The patient has consented to being evaluated through a telephone encounter and understands the associated risks (an examination cannot be done and the patient may need to come in for an appointment) / benefits (allows the patient to remain at home, decreasing exposure to coronavirus).      Crystal Snow is a 62 y.o. female who presents for Medicare Annual (Subsequent) preventive examination.  Review of Systems    Review of Systems  All other systems reviewed and are negative.   Objective:    Today's Vitals   11/18/22 1523  PainSc: 0-No pain   There is no height or weight on file to calculate BMI.     10/30/2022    7:21 AM 03/07/2021    9:09 AM 03/06/2021    3:09 PM 03/26/2016    9:17 AM 12/29/2015   11:32 PM 10/04/2015    9:35 AM 01/23/2015   10:00 PM  Advanced Directives  Does Patient Have a Medical Advance Directive? No No No No No No No  Would patient like information on creating a medical advance directive? Yes (ED - Information included in AVS) No - Patient declined No - Patient declined No - patient declined information  No - patient declined information No - patient declined information    Current Medications (verified) Outpatient Encounter Medications as of 11/18/2022  Medication Sig   amLODipine (NORVASC) 10 MG tablet Take 10 mg by mouth daily.   aspirin EC 81 MG tablet Take 81 mg by mouth daily.   atorvastatin (LIPITOR) 80 MG tablet Take 80 mg by mouth daily.   carvedilol (COREG) 25 MG tablet Take 25 mg by mouth 2 (two) times daily.   Cholecalciferol (VITAMIN D3) 50 MCG (2000 UT) TABS Take 2,000 Units by mouth daily.   DULoxetine (CYMBALTA) 30 MG capsule Take 30 mg by mouth daily.   empagliflozin  (JARDIANCE) 25 MG TABS tablet Take 25 mg by mouth daily.   Fiber Adult Gummies 2 g CHEW Chew 2 g by mouth daily.   gabapentin (NEURONTIN) 300 MG capsule Take 300 mg by mouth 2 (two) times daily.   glipiZIDE (GLUCOTROL) 10 MG tablet Take 10 mg by mouth 2 (two) times daily before a meal.   hydrOXYzine (VISTARIL) 25 MG capsule Take 25 mg by mouth daily.   lisinopril (ZESTRIL) 40 MG tablet Take 40 mg by mouth daily.   metFORMIN (GLUCOPHAGE) 1000 MG tablet Take 1 tablet (1,000 mg total) by mouth 2 (two) times daily with a meal.   Multiple Vitamins-Minerals (MULTIVITAMIN WITH MINERALS) tablet Take 1 tablet by mouth daily. Centrum   Semaglutide,0.25 or 0.5MG /DOS, (OZEMPIC, 0.25 OR 0.5 MG/DOSE,) 2 MG/3ML SOPN Inject 0.5mg  weekly Subcutaneous for 90 days   vitamin B-12 (CYANOCOBALAMIN) 1000 MCG tablet Take 1,000 mcg by mouth daily.   vitamin C (ASCORBIC ACID) 250 MG tablet Take 250 mg by mouth daily.   No facility-administered encounter medications on file as of 11/18/2022.    Allergies (verified) Patient has no known allergies.   History: Past Medical History:  Diagnosis Date   Cholecystitis    lap chole 2006   CVA (cerebral infarction)    03/2014   Diabetes mellitus without complication (HCC)    Dyslipidemia    Gout  Hypertension    Obesity (BMI 30-39.9)    Obstructive sleep apnea    Past Surgical History:  Procedure Laterality Date   COLONOSCOPY WITH PROPOFOL N/A 03/07/2021   Procedure: COLONOSCOPY WITH PROPOFOL;  Surgeon: Rogene Houston, MD;  [facility];  Service: Endoscopy;  Laterality: N/A;  10:55   TOTAL ABDOMINAL HYSTERECTOMY     Family History  Problem Relation Age of Onset   Diabetes Mother    CVA Mother    Hypertension Mother    Cancer Father    Hypertension Sister    Cancer Brother    Cancer Maternal Grandmother    Diabetes Maternal Grandmother    Cancer Maternal Grandfather    Cancer Paternal Grandmother    Cancer Paternal Grandfather    Social  History   Socioeconomic History   Marital status: Single    Spouse name: Not on file   Number of children: Not on file   Years of education: Not on file   Highest education level: Not on file  Occupational History   Not on file  Tobacco Use   Smoking status: Never   Smokeless tobacco: Never  Vaping Use   Vaping Use: Never used  Substance and Sexual Activity   Alcohol use: No    Alcohol/week: 0.0 standard drinks of alcohol   Drug use: No   Sexual activity: Not on file  Other Topics Concern   Not on file  Social History Narrative   Not on file   Social Determinants of Health   Financial Resource Strain: Not on file  Food Insecurity: Not on file  Transportation Needs: Not on file  Physical Activity: Not on file  Stress: Not on file  Social Connections: Not on file    Tobacco Counseling Counseling given: Not Answered   Clinical Intake:  Pre-visit preparation completed: Yes  Pain : No/denies pain Pain Score: 0-No pain     Nutritional Status: BMI > 30  Obese Diabetes: Yes CBG done?: No Did pt. bring in CBG monitor from home?: No  How often do you need to have someone help you when you read instructions, pamphlets, or other written materials from your doctor or pharmacy?: 5 - Always What is the last grade level you completed in school?: 12th grade   Activities of Daily Living    11/18/2022    3:28 PM  In your present state of health, do you have any difficulty performing the following activities:  Hearing? 0  Vision? 0  Difficulty concentrating or making decisions? 0  Walking or climbing stairs? 0  Dressing or bathing? 0  Doing errands, shopping? 0    Patient Care Team: Johnette Abraham, MD as PCP - General (Internal Medicine)  Indicate any recent Medical Services you may have received from other than [facility] providers in the past year (date may be approximate).     Assessment:   This is a routine wellness examination for Crystal Snow.  Hearing/Vision  screen No results found.  Dietary issues and exercise activities discussed:     Goals Addressed   None    Depression Screen    11/18/2022    3:25 PM 11/13/2022    2:39 PM  PHQ 2/9 Scores  PHQ - 2 Score 0 3  PHQ- 9 Score  8    Fall Risk    11/18/2022    3:25 PM 11/13/2022    2:39 PM  Fall Risk   Falls in the past year? 1 1  Number  falls in past yr: 0 1  Injury with Fall? 1 1  Comment Black eye   Risk for fall due to :  Impaired mobility  Follow up  Falls evaluation completed    FALL RISK PREVENTION PERTAINING TO THE HOME:  Any stairs in or around the home? Yes  If so, are there any without handrails? Yes  Home free of loose throw rugs in walkways, pet beds, electrical cords, etc? Yes  Adequate lighting in your home to reduce risk of falls? Yes   ASSISTIVE DEVICES UTILIZED TO PREVENT FALLS:  Life alert? No  Use of a cane, walker or w/c? Yes  Grab bars in the bathroom? No  Shower chair or bench in shower? Yes  Elevated toilet seat or a handicapped toilet? No    Cognitive Function:        11/18/2022    3:26 PM  6CIT Screen  What Year? 0 points  What month? 0 points  What time? 0 points  Count back from 20 0 points  Months in reverse 2 points  Repeat phrase 4 points  Total Score 6 points    Immunizations Immunization History  Administered Date(s) Administered   Influenza Whole 06/09/2006   Influenza,inj,Quad PF,6+ Mos 07/11/2022   PNEUMOCOCCAL CONJUGATE-20 05/28/2021   Pneumococcal Polysaccharide-23 03/31/2006   Td 02/15/2005   Zoster Recombinat (Shingrix) 10/15/2008    TDAP status: Due, Education has been provided regarding the importance of this vaccine. Advised may receive this vaccine at local pharmacy or Health Dept. Aware to provide a copy of the vaccination record if obtained from local pharmacy or Health Dept. Verbalized acceptance and understanding.  Flu Vaccine status: Up to date  Pneumococcal vaccine status: Up to date  Covid-19 vaccine  status: Completed vaccinesNot recorded in chart, patient will bring to next visit  Qualifies for Shingles Vaccine? Yes   Zostavax completed No   Shingrix Completed?: No.    Education has been provided regarding the importance of this vaccine. Patient has been advised to call insurance company to determine out of pocket expense if they have not yet received this vaccine. Advised may also receive vaccine at local pharmacy or Health Dept. Verbalized acceptance and understanding.Patient says she has not had vaccine. One vaccine recorded in 2010.   Screening Tests Health Maintenance  Topic Date Due   Medicare Annual Wellness (AWV)  Never done   COVID-19 Vaccine (1) Never done   OPHTHALMOLOGY EXAM  Never done   Diabetic kidney evaluation - Urine ACR  Never done   Hepatitis C Screening  Never done   Zoster Vaccines- Shingrix (2 of 2) 12/10/2008   DTaP/Tdap/Td (2 - Tdap) 02/16/2015   INFLUENZA VACCINE  03/20/2023   HEMOGLOBIN A1C  04/02/2023   FOOT EXAM  05/31/2023   Diabetic kidney evaluation - eGFR measurement  10/30/2023   MAMMOGRAM  06/14/2024   COLONOSCOPY (Pts 45-42yrs Insurance coverage will need to be confirmed)  03/08/2031   HIV Screening  Completed   HPV VACCINES  Aged Out    Health Maintenance  Health Maintenance Due  Topic Date Due   Medicare Annual Wellness (AWV)  Never done   COVID-19 Vaccine (1) Never done   OPHTHALMOLOGY EXAM  Never done   Diabetic kidney evaluation - Urine ACR  Never done   Hepatitis C Screening  Never done   Zoster Vaccines- Shingrix (2 of 2) 12/10/2008   DTaP/Tdap/Td (2 - Tdap) 02/16/2015    Colorectal cancer screening: Type of screening: Colonoscopy. Completed 03/07/2021.  Repeat every 10 years  Mammogram status: Completed 06/14/2022. Repeat every year  Lung Cancer Screening: (Low Dose CT Chest recommended if Age 74-80 years, 30 pack-year currently smoking OR have quit w/in 15years.) does not qualify.    Additional Screening:  Hepatitis C  Screening: does qualify; Can check at next in office visit.  Vision Screening: Recommended annual ophthalmology exams for early detection of glaucoma and other disorders of the eye. Is the patient up to date with their annual eye exam?  Yes  Who is the provider or what is the name of the office in which the patient attends annual eye exams? [facility] If pt is not established with a provider, would they like to be referred to a provider to establish care? No .   Dental Screening: Recommended annual dental exams for proper oral hygiene  Community Resource Referral / Chronic Care Management: CRR required this visit?  No   CCM required this visit?  No      Plan:     I have personally reviewed and noted the following in the patient's chart:   Medical and social history Use of alcohol, tobacco or illicit drugs  Current medications and supplements including opioid prescriptions. Patient is not currently taking opioid prescriptions. Functional ability and status Nutritional status Physical activity Advanced directives List of other physicians Hospitalizations, surgeries, and ER visits in previous 12 months Vitals Screenings to include cognitive, depression, and falls Referrals and appointments  In addition, I have reviewed and discussed with patient certain preventive protocols, quality metrics, and best practice recommendations. A written personalized care plan for preventive services as well as general preventive health recommendations were provided to patient.     Crystal Dy, MD   11/18/2022

## 2022-11-18 NOTE — Patient Instructions (Addendum)
  Crystal Snow , Thank you for taking time to come for your Medicare Wellness Visit. I appreciate your ongoing commitment to your health goals. Please review the following plan we discussed and let me know if I can assist you in the future.   These are the goals we discussed:  Goals      Increase physical activity     Regular physical activity is one of the most important things you can do for your health. If you're ready to get the immediate benefits of better sleep, reduced anxiety, and lower blood pressure, here are ways to get started. Look for ways to reduce time sitting and increase time moving. For example, make it a tradition to walk before or after dinner. I recommend at least 150 minutes of moderate-intensity physical activity a week for adults. You might split that into 30 minutes, 5 days a week.          Life Alert: 1 [phone]  This is a list of the screening recommended for you and due dates:  Health Maintenance  Topic Date Due   Medicare Annual Wellness Visit  Never done   COVID-19 Vaccine (1) Never done   Eye exam for diabetics  Never done   Yearly kidney health urinalysis for diabetes  Never done   Hepatitis C Screening: USPSTF Recommendation to screen - Ages 52-79 yo.  Never done   Zoster (Shingles) Vaccine (2 of 2) 12/10/2008   DTaP/Tdap/Td vaccine (2 - Tdap) 02/16/2015   Flu Shot  03/20/2023   Hemoglobin A1C  04/02/2023   Complete foot exam   05/31/2023   Yearly kidney function blood test for diabetes  10/30/2023   Mammogram  06/14/2024   Colon Cancer Screening  03/08/2031   HIV Screening  Completed   HPV Vaccine  Aged Out

## 2022-11-19 ENCOUNTER — Encounter: Payer: Self-pay | Admitting: Internal Medicine

## 2022-11-19 DIAGNOSIS — N182 Chronic kidney disease, stage 2 (mild): Secondary | ICD-10-CM | POA: Insufficient documentation

## 2022-11-19 DIAGNOSIS — E1169 Type 2 diabetes mellitus with other specified complication: Secondary | ICD-10-CM | POA: Insufficient documentation

## 2022-11-19 DIAGNOSIS — Z0001 Encounter for general adult medical examination with abnormal findings: Secondary | ICD-10-CM | POA: Insufficient documentation

## 2022-11-19 DIAGNOSIS — G629 Polyneuropathy, unspecified: Secondary | ICD-10-CM | POA: Insufficient documentation

## 2022-11-19 DIAGNOSIS — I350 Nonrheumatic aortic (valve) stenosis: Secondary | ICD-10-CM | POA: Insufficient documentation

## 2022-11-19 NOTE — Assessment & Plan Note (Signed)
Noted on most recent TTE.  Asymptomatic currently.  Followed by cardiology and has an appointment scheduled for June.

## 2022-11-19 NOTE — Assessment & Plan Note (Signed)
Presenting today to establish care.  Available records and labs have been reviewed. -Records from [place] have been requested -We will tentatively plan for follow-up in 3 months

## 2022-11-19 NOTE — Assessment & Plan Note (Signed)
She endorses a history of neuropathy and is prescribed gabapentin 300 mg twice daily as needed for pain relief as well as Cymbalta 30 mg daily. -No medication changes today

## 2022-11-19 NOTE — Assessment & Plan Note (Signed)
Currently prescribed atorvastatin 80 mg daily.  Lipid panel recently updated and is at goal. -No medication changes today

## 2022-11-19 NOTE — Assessment & Plan Note (Signed)
CKD stage II based on most recent labs.  She is currently on Jardiance and lisinopril. -No medication changes today -Repeat labs at follow-up in 3 months

## 2022-11-19 NOTE — Assessment & Plan Note (Signed)
Previously documented history of nonischemic cardiomyopathy.  EF normalized on echo from August 2022.  She is followed by cardiology and has an appointment scheduled for June.  Euvolemic on exam today. -No medication changes today.  Continue carvedilol, lisinopril, and Jardiance

## 2022-11-19 NOTE — Assessment & Plan Note (Signed)
Previously documented history of hypertension.  She is currently prescribed atenolol 25 mg twice daily, lisinopril 40 mg daily, and amlodipine 5 mg daily.  BP today is 114/74. -No medication changes today.  Continue current regimen.

## 2022-11-19 NOTE — Assessment & Plan Note (Signed)
Previously documented history of T2DM.  Last A1c 6.6 in February.  She is currently prescribed metformin 1000 mg twice daily, Ozempic 0.25 mg weekly, glipizide 10 mg twice daily, and Jardiance 25 mg daily. -No medication changes today -Repeat labs at follow-up in 3 months

## 2022-11-19 NOTE — Assessment & Plan Note (Signed)
Previously documented history of CVA (2015).  She is currently on ASA and statin therapy.  No residual deficits appreciated on exam today. -No medication changes today.  Continue ASA/statin

## 2022-12-12 DIAGNOSIS — H2513 Age-related nuclear cataract, bilateral: Secondary | ICD-10-CM | POA: Diagnosis not present

## 2022-12-12 DIAGNOSIS — Z794 Long term (current) use of insulin: Secondary | ICD-10-CM | POA: Diagnosis not present

## 2022-12-12 DIAGNOSIS — Z7984 Long term (current) use of oral hypoglycemic drugs: Secondary | ICD-10-CM | POA: Diagnosis not present

## 2022-12-12 DIAGNOSIS — E119 Type 2 diabetes mellitus without complications: Secondary | ICD-10-CM | POA: Diagnosis not present

## 2022-12-19 DIAGNOSIS — H2511 Age-related nuclear cataract, right eye: Secondary | ICD-10-CM | POA: Diagnosis not present

## 2022-12-19 DIAGNOSIS — H2512 Age-related nuclear cataract, left eye: Secondary | ICD-10-CM | POA: Diagnosis not present

## 2023-01-02 DIAGNOSIS — H2512 Age-related nuclear cataract, left eye: Secondary | ICD-10-CM | POA: Diagnosis not present

## 2023-01-09 ENCOUNTER — Encounter (HOSPITAL_COMMUNITY): Payer: Self-pay | Admitting: Oral Surgery

## 2023-01-10 NOTE — Progress Notes (Signed)
Anesthesia Chart Review: Same-day workup  Follows with cardiology at [facility] for history of HTN, HLD, CVA 2015, nonischemic cardiomyopathy with normalized EF by echo 2016, CVA, mild AS.  Recent echo 04/06/2021 showed normal LVEF, grade 1 DD, mild AS (mean gradient 7 mmHg, AVA 1.94 cm).  Last seen by cardiology 01/09/2022 and noted to be doing well at that time, no changes to management.  Establish care with new PCP Dr. Delmar Landau on 11/19/2022.  Per note doing well overall, euvolemic from cardiac standpoint, no symptoms related to mild AS.  No changes made to management.  Recommended follow-up with cardiology as scheduled in June.  Non-insulin-dependent DM2, controlled, A1c 6.5 on 10/02/2022. She is on once weekly GLP1 agonist semaglutide. Thus far we have not been able to get in contact with the pt to confirm when this was held. PAT RN will continue to attempt to reach.  Patient will need day of surgery labs and evaluation.  EKG 01/30/21 (care everywhere): Sinus rhythm wit 1st degree AV block. Rate 78. Low voltage QRS. Can't rule out anterior infarct, age undetermiend.   TTE 04/06/2021:  1. Left ventricular ejection fraction, by estimation, is 60 to 65%. The  left ventricle has normal function. The left ventricle has no regional  wall motion abnormalities. There is mild left ventricular hypertrophy.  Left ventricular diastolic parameters  are consistent with Grade I diastolic dysfunction (impaired relaxation).   2. Right ventricular systolic function is normal. The right ventricular  size is normal. There is normal pulmonary artery systolic pressure. The  estimated right ventricular systolic pressure is 29.4 mmHg.   3. There is a trivial pericardial effusion posterior to the left  ventricle.   4. The mitral valve is abnormal. Mild mitral valve regurgitation.  Moderate mitral annular calcification.   5. The aortic valve is tricuspid. There is moderate calcification of the  aortic valve. Aortic  valve regurgitation is not visualized. Aortic valve  area, by VTI measures 1.94 cm. Aortic valve mean gradient measures 7.0  mmHg. Aortic valve Vmax measures  1.96 m/s. Moderately sclerotic to mildly stenotic aortic valve.   6. The inferior vena cava is normal in size with greater than 50%  respiratory variability, suggesting right atrial pressure of 3 mmHg.   Nuclear stress 04/21/2014: IMPRESSION:  1. No reversible ischemia or infarction.   2. Abnormal left ventricular wall motion.   3. Left ventricular ejection fraction 21%   4. High-risk stress test findings based on low ejection fraction.  Recommend correlate findings with echocardiogram. There is no  evidence of scar, no ischemia currently at jeopardy*.     Zannie Cove [facility] Short Stay Center/Anesthesiology Phone [phone] 01/10/2023 3:21 PM

## 2023-01-14 ENCOUNTER — Encounter (HOSPITAL_COMMUNITY): Payer: Self-pay | Admitting: Oral Surgery

## 2023-01-14 ENCOUNTER — Other Ambulatory Visit: Payer: Self-pay

## 2023-01-14 NOTE — H&P (Signed)
  Patient: Crystal Snow  PID: [id number]  DOB: 1960-11-11  SEX: Female   Patient referred by  DDS for extraction teeth# 5, 6, 7, 8, 12, frenectomy.  CC: No pain.  Past Medical History:  CVA, OSA, Difficult breathing, Diabetes, Night Sweats, Obese, HTN, Gout    Medications: Aspirin, Atorvastatin, Metformin ER, Carvedilol, Duloxetine, Jardiance, Lisinopril, Vitamin D3, fiber select gummies, b12    Allergies:     NKDA    Surgeries:   Hysterectomy, Colonoscopy     Social History       Smoking:  n          Alcohol:n Drug use:n                             Exam: BMI 38. Large decay teeth # 6, 9. Attrition and erosion teeth # 5, 7, 8, 12 with sharp edges. Maxillary buccal frenum low on alveolar ridge into attached gingiva. Mandibular teeth # 22-26, 29 present. Large bilateral lingual tori. Minimal tongue elevation, short lingual frenum. No purulence, edema, fluctuance, trismus. Oral cancer screening negative. Pharynx clear. No lymphadenopathy.  Panorex: Large decay teeth # 6, 9. Attrition and erosion teeth # 5, 7, 8, 12 Mandibular teeth # 22-26, 29 present.  Assessment: ASA 3. Non-restorable  5, 6, 7, 8, 9, 12. Ankyloglossia. Bilateral mandibular lingual tori.             Plan: 1. MD Clearance.   2. Check with GDDS re  frenum, lingual tori. Extraction Teeth # 5, 6, 7, 8, 9, 12. Alveoloplasty. Mandibular lingual frenectomy. Removal mandibular lingual tori.  Hospital Day surgery.                 Rx: n               Risks and complications explained. Questions answered.   Georgia Lopes, DMD

## 2023-01-14 NOTE — Progress Notes (Signed)
SDW call  Patient was given pre-op instructions over the phone. Patient verbalized understanding of instructions provided.    PCP - Dr. Christel Mormon, [facility] Cardiologist -  Dr. Katheren Shams Pulmonary:    PPM/ICD - Denies  Chest x-ray - n/a EKG -  DOS 01/15/2023 Stress Test -  ECHO - 04/06/2021 Cardiac Cath -   Sleep Study/sleep apnea/CPAP: Denies  Type II diabetes Fasting Blood sugar range: 102-125 How often check sugars: Daily Jardiance, states last dose 01/13/2023 Glipizide, instructed not to take evening or morning dose Metformin, instructed not to take the morning of surgery Ozempic, states last dose 01/06/2023   Blood Thinner Instructions: Denies Aspirin Instructions: States last dose 01/14/2023   ERAS Protcol - No, NPO PRE-SURGERY Ensure or G2-    COVID TEST- n/a    Anesthesia review: Yes. HTN, DM, cardiomyopathy, aortic stenosis, CVA, CKD, high cholesterol   Patient denies shortness of breath, fever, cough and chest pain over the phone call  Your procedure is scheduled on Wednesday Jan 15, 2023  Report to [facility] Main Entrance "A" at    A.M., then check in with the Admitting office.  Call this number if you have problems the morning of surgery:  [phone]   If you have any questions prior to your surgery date call [phone]: Open Monday-Friday 8am-4pm If you experience any cold or flu symptoms such as cough, fever, chills, shortness of breath, etc. between now and your scheduled surgery, please notify us at the above number    Remember:  Do not eat or drink after midnight the night before your surgery   Take these medicines the morning of surgery with A SIP OF WATER:  Amlodipine, atorvastatin, carvedilol, duloxetine, gabapentin, hydroxyzine  As of today, STOP taking any Aspirin (unless otherwise instructed by your surgeon) Aleve, Naproxen, Ibuprofen, Motrin, Advil, Goody's, BC's, all herbal medications, fish oil, and all  vitamins.

## 2023-01-14 NOTE — Anesthesia Preprocedure Evaluation (Signed)
Anesthesia Evaluation  Patient identified by MRN, date of birth, ID band Patient awake    Reviewed: Allergy & Precautions, NPO status , Patient's Chart, lab work & pertinent test results  Airway Mallampati: II  TM Distance: >3 FB Neck ROM: Full    Dental  (+) Dental Advisory Given, Poor Dentition, Chipped, Missing   Pulmonary sleep apnea    Pulmonary exam normal breath sounds clear to auscultation       Cardiovascular hypertension, Pt. on medications and Pt. on home beta blockers + Valvular Problems/Murmurs AS  Rhythm:Regular Rate:Normal + Systolic murmurs Echo 2022  1. Left ventricular ejection fraction, by estimation, is 60 to 65%. The left ventricle has normal function. The left ventricle has no regional wall motion abnormalities. There is mild left ventricular hypertrophy. Left ventricular diastolic parameters are consistent with Grade I diastolic dysfunction (impaired relaxation).   2. Right ventricular systolic function is normal. The right ventricular  size is normal. There is normal pulmonary artery systolic pressure. The estimated right ventricular systolic pressure is 29.4 mmHg.   3. There is a trivial pericardial effusion posterior to the left ventricle.   4. The mitral valve is abnormal. Mild mitral valve regurgitation. Moderate mitral annular calcification.   5. The aortic valve is tricuspid. There is moderate calcification of the aortic valve. Aortic valve regurgitation is not visualized. Aortic valve area, by VTI measures 1.94 cm. Aortic valve mean gradient measures 7.0 mmHg. Aortic valve Vmax measures 1.96 m/s. Moderately sclerotic to mildly stenotic aortic valve.   6. The inferior vena cava is normal in size with greater than 50% respiratory variability, suggesting right atrial pressure of 3 mmHg.   Comparison(s): No prior Echocardiogram.      Neuro/Psych TIACVA, Residual Symptoms    GI/Hepatic negative GI ROS, Neg  liver ROS,,,  Endo/Other  diabetes  Morbid obesity  Renal/GU Renal disease     Musculoskeletal negative musculoskeletal ROS (+)    Abdominal  (+) + obese  Peds  Hematology negative hematology ROS (+)   Anesthesia Other Findings   Reproductive/Obstetrics                              Anesthesia Physical Anesthesia Plan  ASA: 4  Anesthesia Plan: General   Post-op Pain Management: Tylenol PO (pre-op)*   Induction:   PONV Risk Score and Plan: 4 or greater and Ondansetron and Dexamethasone  Airway Management Planned: Oral ETT, Nasal ETT and Video Laryngoscope Planned  Additional Equipment:   Intra-op Plan:   Post-operative Plan: Extubation in OR  Informed Consent: I have reviewed the patients History and Physical, chart, labs and discussed the procedure including the risks, benefits and alternatives for the proposed anesthesia with the patient or authorized representative who has indicated his/her understanding and acceptance.     Dental advisory given  Plan Discussed with: CRNA  Anesthesia Plan Comments:         Anesthesia Quick Evaluation

## 2023-01-15 ENCOUNTER — Ambulatory Visit (HOSPITAL_COMMUNITY): Payer: 59 | Admitting: Physician Assistant

## 2023-01-15 ENCOUNTER — Ambulatory Visit (HOSPITAL_COMMUNITY)
Admission: RE | Admit: 2023-01-15 | Discharge: 2023-01-15 | Disposition: A | Discharge status: Home / Self Care | Payer: 59 | Attending: Oral Surgery | Admitting: Oral Surgery

## 2023-01-15 ENCOUNTER — Encounter (HOSPITAL_COMMUNITY): Payer: Self-pay | Admitting: Oral Surgery

## 2023-01-15 ENCOUNTER — Other Ambulatory Visit: Payer: Self-pay

## 2023-01-15 ENCOUNTER — Encounter (HOSPITAL_COMMUNITY)
Admission: RE | Disposition: A | Discharge status: Home / Self Care | Payer: Self-pay | Source: Home / Self Care | Attending: Oral Surgery

## 2023-01-15 ENCOUNTER — Ambulatory Visit (HOSPITAL_BASED_OUTPATIENT_CLINIC_OR_DEPARTMENT_OTHER): Payer: 59 | Admitting: Physician Assistant

## 2023-01-15 DIAGNOSIS — K029 Dental caries, unspecified: Secondary | ICD-10-CM | POA: Insufficient documentation

## 2023-01-15 DIAGNOSIS — M270 Developmental disorders of jaws: Secondary | ICD-10-CM

## 2023-01-15 DIAGNOSIS — Q381 Ankyloglossia: Secondary | ICD-10-CM | POA: Insufficient documentation

## 2023-01-15 DIAGNOSIS — K0850 Unsatisfactory restoration of tooth, unspecified: Secondary | ICD-10-CM | POA: Diagnosis not present

## 2023-01-15 DIAGNOSIS — Z6841 Body Mass Index (BMI) 40.0 and over, adult: Secondary | ICD-10-CM | POA: Insufficient documentation

## 2023-01-15 DIAGNOSIS — G4733 Obstructive sleep apnea (adult) (pediatric): Secondary | ICD-10-CM | POA: Diagnosis not present

## 2023-01-15 DIAGNOSIS — E785 Hyperlipidemia, unspecified: Secondary | ICD-10-CM | POA: Insufficient documentation

## 2023-01-15 DIAGNOSIS — I340 Nonrheumatic mitral (valve) insufficiency: Secondary | ICD-10-CM | POA: Insufficient documentation

## 2023-01-15 DIAGNOSIS — I10 Essential (primary) hypertension: Secondary | ICD-10-CM | POA: Diagnosis not present

## 2023-01-15 DIAGNOSIS — E119 Type 2 diabetes mellitus without complications: Secondary | ICD-10-CM | POA: Insufficient documentation

## 2023-01-15 DIAGNOSIS — I700 Atherosclerosis of aorta: Secondary | ICD-10-CM | POA: Diagnosis not present

## 2023-01-15 DIAGNOSIS — I350 Nonrheumatic aortic (valve) stenosis: Secondary | ICD-10-CM | POA: Diagnosis not present

## 2023-01-15 HISTORY — PX: TOOTH EXTRACTION: SHX859

## 2023-01-15 HISTORY — DX: Cardiomyopathy, unspecified: I42.9

## 2023-01-15 HISTORY — DX: Cerebral infarction, unspecified: I63.9

## 2023-01-15 HISTORY — DX: Chronic kidney disease, unspecified: N18.9

## 2023-01-15 HISTORY — DX: Nonrheumatic aortic (valve) stenosis: I35.0

## 2023-01-15 LAB — BASIC METABOLIC PANEL
Anion gap: 12 (ref 5–15)
BUN: 12 mg/dL (ref 8–23)
CO2: 25 mmol/L (ref 22–32)
Calcium: 9.6 mg/dL (ref 8.9–10.3)
Chloride: 102 mmol/L (ref 98–111)
Creatinine, Ser: 0.93 mg/dL (ref 0.44–1.00)
GFR, Estimated: >60 mL/min (ref >60)
Glucose, Bld: 118 mg/dL — ABNORMAL HIGH (ref 70–99)
Potassium: 4.1 mmol/L (ref 3.5–5.1)
Sodium: 139 mmol/L (ref 135–145)

## 2023-01-15 LAB — CBC
HCT: 42.4 % (ref 36.0–46.0)
Hemoglobin: 13.1 g/dL (ref 12.0–15.0)
MCH: 26.5 pg (ref 26.0–34.0)
MCHC: 30.9 g/dL (ref 30.0–36.0)
MCV: 85.7 fL (ref 80.0–100.0)
Platelets: 293 10*3/uL (ref 150–400)
RBC: 4.95 MIL/uL (ref 3.87–5.11)
RDW: 15.2 % (ref 11.5–15.5)
WBC: 7.4 10*3/uL (ref 4.0–10.5)
nRBC: 0 % (ref 0.0–0.2)

## 2023-01-15 LAB — GLUCOSE, CAPILLARY
Glucose-Capillary: 110 mg/dL — ABNORMAL HIGH (ref 70–99)
Glucose-Capillary: 187 mg/dL — ABNORMAL HIGH (ref 70–99)

## 2023-01-15 SURGERY — DENTAL RESTORATION/EXTRACTIONS
Anesthesia: General | Site: Mouth

## 2023-01-15 MED ORDER — PROPOFOL 10 MG/ML IV BOLUS
INTRAVENOUS | Status: AC
  Filled 2023-01-15: qty 20

## 2023-01-15 MED ORDER — 0.9 % SODIUM CHLORIDE (POUR BTL) OPTIME
TOPICAL | Status: DC | PRN
  Administered 2023-01-15: 1000 mL

## 2023-01-15 MED ORDER — GELATIN ABSORBABLE 12-7 MM EX MISC
CUTANEOUS | Status: DC | PRN
  Administered 2023-01-15: 1 via TOPICAL

## 2023-01-15 MED ORDER — PROPOFOL 10 MG/ML IV BOLUS
INTRAVENOUS | Status: DC | PRN
  Administered 2023-01-15: 120 mg via INTRAVENOUS
  Administered 2023-01-15: 25 mg via INTRAVENOUS

## 2023-01-15 MED ORDER — FENTANYL CITRATE (PF) 100 MCG/2ML IJ SOLN
INTRAMUSCULAR | Status: DC | PRN
  Administered 2023-01-15: 100 ug via INTRAVENOUS

## 2023-01-15 MED ORDER — LIDOCAINE-EPINEPHRINE 2 %-1:100000 IJ SOLN
INTRAMUSCULAR | Status: DC | PRN
  Administered 2023-01-15 (×2): 20 mL

## 2023-01-15 MED ORDER — SODIUM CHLORIDE 0.9 % IR SOLN
Status: DC | PRN
  Administered 2023-01-15: 1000 mL

## 2023-01-15 MED ORDER — OXYCODONE HCL 5 MG PO TABS: 5.0000 mg | ORAL_TABLET | Freq: Once | ORAL | Status: DC | PRN

## 2023-01-15 MED ORDER — HYDROCODONE-ACETAMINOPHEN 5-325 MG PO TABS: 1.0000 | ORAL_TABLET | Freq: Four times a day (QID) | ORAL | 0 refills | Status: DC | PRN

## 2023-01-15 MED ORDER — LACTATED RINGERS IV SOLN: INTRAVENOUS | Status: DC

## 2023-01-15 MED ORDER — LIDOCAINE-EPINEPHRINE 2 %-1:100000 IJ SOLN
INTRAMUSCULAR | Status: AC
  Filled 2023-01-15: qty 1

## 2023-01-15 MED ORDER — CHLORHEXIDINE GLUCONATE 0.12 % MT SOLN
OROMUCOSAL | Status: AC
  Filled 2023-01-15: qty 15

## 2023-01-15 MED ORDER — ORAL CARE MOUTH RINSE: 15.0000 mL | Freq: Once | OROMUCOSAL | Status: AC

## 2023-01-15 MED ORDER — PHENYLEPHRINE 80 MCG/ML (10ML) SYRINGE FOR IV PUSH (FOR BLOOD PRESSURE SUPPORT)
PREFILLED_SYRINGE | INTRAVENOUS | Status: DC | PRN
  Administered 2023-01-15 (×2): 80 ug via INTRAVENOUS
  Administered 2023-01-15: 160 ug via INTRAVENOUS
  Administered 2023-01-15: 80 ug via INTRAVENOUS
  Administered 2023-01-15: 160 ug via INTRAVENOUS
  Administered 2023-01-15 (×6): 80 ug via INTRAVENOUS

## 2023-01-15 MED ORDER — OXYCODONE HCL 5 MG/5ML PO SOLN: 5.0000 mg | Freq: Once | ORAL | Status: DC | PRN

## 2023-01-15 MED ORDER — SUGAMMADEX SODIUM 200 MG/2ML IV SOLN
INTRAVENOUS | Status: DC | PRN
  Administered 2023-01-15: 200 mg via INTRAVENOUS

## 2023-01-15 MED ORDER — FENTANYL CITRATE (PF) 100 MCG/2ML IJ SOLN: 25.0000 ug | INTRAMUSCULAR | Status: DC | PRN

## 2023-01-15 MED ORDER — FENTANYL CITRATE (PF) 100 MCG/2ML IJ SOLN
INTRAMUSCULAR | Status: AC
  Filled 2023-01-15: qty 2

## 2023-01-15 MED ORDER — ROCURONIUM BROMIDE 10 MG/ML (PF) SYRINGE
PREFILLED_SYRINGE | INTRAVENOUS | Status: DC | PRN
  Administered 2023-01-15: 50 mg via INTRAVENOUS

## 2023-01-15 MED ORDER — MEPERIDINE HCL 25 MG/ML IJ SOLN: 6.2500 mg | INTRAMUSCULAR | Status: DC | PRN

## 2023-01-15 MED ORDER — DEXAMETHASONE SODIUM PHOSPHATE 10 MG/ML IJ SOLN
INTRAMUSCULAR | Status: DC | PRN
  Administered 2023-01-15: 5 mg via INTRAVENOUS

## 2023-01-15 MED ORDER — CHLORHEXIDINE GLUCONATE 0.12 % MT SOLN
15.0000 mL | Freq: Once | OROMUCOSAL | Status: AC
  Administered 2023-01-15: 15 mL via OROMUCOSAL

## 2023-01-15 MED ORDER — CEFAZOLIN SODIUM-DEXTROSE 2-4 GM/100ML-% IV SOLN
INTRAVENOUS | Status: AC
  Filled 2023-01-15: qty 100

## 2023-01-15 MED ORDER — OXYMETAZOLINE HCL 0.05 % NA SOLN
NASAL | Status: AC
  Filled 2023-01-15: qty 30

## 2023-01-15 MED ORDER — PROMETHAZINE HCL 25 MG/ML IJ SOLN: 6.2500 mg | INTRAMUSCULAR | Status: DC | PRN

## 2023-01-15 MED ORDER — LIDOCAINE 2% (20 MG/ML) 5 ML SYRINGE
INTRAMUSCULAR | Status: DC | PRN
  Administered 2023-01-15: 100 mg via INTRAVENOUS

## 2023-01-15 MED ORDER — AMOXICILLIN 500 MG PO CAPS: 500.0000 mg | ORAL_CAPSULE | Freq: Three times a day (TID) | ORAL | 0 refills | Status: DC

## 2023-01-15 MED ORDER — GLYCOPYRROLATE 0.2 MG/ML IJ SOLN
INTRAMUSCULAR | Status: DC | PRN
  Administered 2023-01-15: .2 mg via INTRAVENOUS

## 2023-01-15 MED ORDER — OXYMETAZOLINE HCL 0.05 % NA SOLN
NASAL | Status: DC | PRN
  Administered 2023-01-15: 3 via NASAL

## 2023-01-15 MED ORDER — CEFAZOLIN SODIUM-DEXTROSE 2-4 GM/100ML-% IV SOLN
2.0000 g | INTRAVENOUS | Status: AC
  Administered 2023-01-15: 2 g via INTRAVENOUS

## 2023-01-15 MED ORDER — CARVEDILOL 12.5 MG PO TABS
25.0000 mg | ORAL_TABLET | Freq: Once | ORAL | Status: AC
  Administered 2023-01-15: 25 mg via ORAL
  Filled 2023-01-15: qty 2

## 2023-01-15 MED ORDER — ESMOLOL HCL 100 MG/10ML IV SOLN
INTRAVENOUS | Status: DC | PRN
  Administered 2023-01-15: 20 mg via INTRAVENOUS

## 2023-01-15 MED ORDER — ONDANSETRON HCL 4 MG/2ML IJ SOLN
INTRAMUSCULAR | Status: DC | PRN
  Administered 2023-01-15: 4 mg via INTRAVENOUS

## 2023-01-15 SURGICAL SUPPLY — 39 items
BAG COUNTER SPONGE SURGICOUNT (BAG) IMPLANT
BAG SPNG CNTER NS LX DISP (BAG)
BLADE SURG 15 STRL LF DISP TIS (BLADE) ×1 IMPLANT
BLADE SURG 15 STRL SS (BLADE) ×1
BUR CROSS CUT FISSURE 1.6 (BURR) ×1 IMPLANT
BUR EGG ELITE 4.0 (BURR) ×1 IMPLANT
CANISTER SUCT 3000ML PPV (MISCELLANEOUS) ×1 IMPLANT
COVER SURGICAL LIGHT HANDLE (MISCELLANEOUS) ×1 IMPLANT
GAUZE PACKING FOLDED 2  STR (GAUZE/BANDAGES/DRESSINGS) ×1
GAUZE PACKING FOLDED 2 STR (GAUZE/BANDAGES/DRESSINGS) ×1 IMPLANT
GLOVE BIO SURGEON STRL SZ 6.5 (GLOVE) IMPLANT
GLOVE BIO SURGEON STRL SZ7 (GLOVE) IMPLANT
GLOVE BIO SURGEON STRL SZ8 (GLOVE) ×1 IMPLANT
GLOVE BIOGEL PI IND STRL 6.5 (GLOVE) IMPLANT
GLOVE BIOGEL PI IND STRL 7.0 (GLOVE) IMPLANT
GOWN STRL REUS W/ TWL LRG LVL3 (GOWN DISPOSABLE) ×1 IMPLANT
GOWN STRL REUS W/ TWL XL LVL3 (GOWN DISPOSABLE) ×1 IMPLANT
GOWN STRL REUS W/TWL LRG LVL3 (GOWN DISPOSABLE) ×1
GOWN STRL REUS W/TWL XL LVL3 (GOWN DISPOSABLE) ×1
IV NS 1000ML (IV SOLUTION) ×1
IV NS 1000ML BAXH (IV SOLUTION) ×1 IMPLANT
KIT BASIN OR (CUSTOM PROCEDURE TRAY) ×1 IMPLANT
KIT TURNOVER KIT B (KITS) ×1 IMPLANT
NDL HYPO 25GX1X1/2 BEV (NEEDLE) ×2 IMPLANT
NEEDLE HYPO 25GX1X1/2 BEV (NEEDLE) ×2 IMPLANT
NS IRRIG 1000ML POUR BTL (IV SOLUTION) ×1 IMPLANT
PAD ARMBOARD 7.5X6 YLW CONV (MISCELLANEOUS) ×1 IMPLANT
SLEEVE IRRIGATION ELITE 7 (MISCELLANEOUS) ×1 IMPLANT
SPIKE FLUID TRANSFER (MISCELLANEOUS) ×1 IMPLANT
SPONGE SURGIFOAM ABS GEL 12-7 (HEMOSTASIS) IMPLANT
SUCTION FRAZIER HANDLE 10FR (MISCELLANEOUS) ×1
SUCTION TUBE FRAZIER 10FR DISP (MISCELLANEOUS) IMPLANT
SUT CHROMIC 3 0 PS 2 (SUTURE) ×1 IMPLANT
SUT CHROMIC 4 0 PS 2 18 (SUTURE) IMPLANT
SYR BULB IRRIG 60ML STRL (SYRINGE) ×1 IMPLANT
SYR CONTROL 10ML LL (SYRINGE) ×1 IMPLANT
TRAY ENT MC OR (CUSTOM PROCEDURE TRAY) ×1 IMPLANT
TUBING IRRIGATION (MISCELLANEOUS) ×1 IMPLANT
YANKAUER SUCT BULB TIP NO VENT (SUCTIONS) ×1 IMPLANT

## 2023-01-15 NOTE — Transfer of Care (Signed)
Immediate Anesthesia Transfer of Care Note  Patient: Crystal Snow  Procedure(s) Performed: DENTAL RESTORATION/EXTRACTIONS (Mouth)  Patient Location: PACU  Anesthesia Type:General  Level of Consciousness: awake, oriented, and drowsy  Airway & Oxygen Therapy: Patient Spontanous Breathing and Patient connected to nasal cannula oxygen  Post-op Assessment: Report given to RN and Post -op Vital signs reviewed and stable  Post vital signs: Reviewed and stable  Last Vitals:  Vitals Value Taken Time  BP 153/85 01/15/23 0915  Temp    Pulse 80 01/15/23 0917  Resp 14 01/15/23 0917  SpO2 98 % 01/15/23 0917  Vitals shown include unvalidated device data.  Last Pain:  Vitals:   01/15/23 0630  TempSrc:   PainSc: 0-No pain         Complications: No notable events documented.

## 2023-01-15 NOTE — Anesthesia Procedure Notes (Signed)
Procedure Name: Intubation Date/Time: 01/15/2023 7:45 AM  Performed by: Stanton Kidney, CRNAPre-anesthesia Checklist: Patient identified, Emergency Drugs available, Suction available and Patient being monitored Patient Re-evaluated:Patient Re-evaluated prior to induction Oxygen Delivery Method: Circle system utilized Preoxygenation: Pre-oxygenation with 100% oxygen Induction Type: IV induction Ventilation: Mask ventilation without difficulty Laryngoscope Size: McGraph and 3 Grade View: Grade I Nasal Tubes: Nasal prep performed and Nasal Rae Endobronchial tube: Right Tube size: 6.5 mm Number of attempts: 1 Airway Equipment and Method: Video-laryngoscopy Placement Confirmation: ETT inserted through vocal cords under direct vision, positive ETCO2 and breath sounds checked- equal and bilateral Secured at: 27 cm Tube secured with: Tape Dental Injury: Teeth and Oropharynx as per pre-operative assessment

## 2023-01-15 NOTE — Anesthesia Postprocedure Evaluation (Signed)
Anesthesia Post Note  Patient: Crystal Snow  Procedure(s) Performed: DENTAL RESTORATION/EXTRACTIONS (Mouth)     Patient location during evaluation: PACU Anesthesia Type: General Level of consciousness: sedated and patient cooperative Pain management: pain level controlled Vital Signs Assessment: post-procedure vital signs reviewed and stable Respiratory status: spontaneous breathing Cardiovascular status: stable Anesthetic complications: no   No notable events documented.  Last Vitals:  Vitals:   01/15/23 0945 01/15/23 1000  BP: (!) 160/94 (!) 156/90  Pulse: 79 80  Resp: 16 19  Temp:  36.6 C  SpO2: 92% 95%    Last Pain:  Vitals:   01/15/23 0945  TempSrc:   PainSc: 0-No pain                 Lewie Loron

## 2023-01-15 NOTE — H&P (Signed)
H&P documentation  -History and Physical Reviewed  -Patient has been re-examined  -No change in the plan of care  Crystal Snow  

## 2023-01-15 NOTE — Op Note (Signed)
01/15/2023  8:55 AM  PATIENT:  Crystal Snow  62 y.o. female  PRE-OPERATIVE DIAGNOSIS:  NON-RESTORABLE TEETH # 5, 6, 7, 8, 9, 12, SECONDARY TO DENTAL CARIES, BILATERAL MANDIBULAR LINGUAL TORI, ANKYLOGLOSSIA, HYPERPLASTIC MAXILLARY BUCCAL FRENUM  POST-OPERATIVE DIAGNOSIS:  SAME  PROCEDURE:  Procedure(s): EXTRACTION TEETH # 5, 6, 7, 8, 9, 12, ALVEOLOPLASTY RIGHT AND LEFT MAXILLA; REMOVAL  BILATERAL MANDIBULAR LINGUAL TORI, LINGUAL FRENECTOMY, HY MAXILLARY BUCCAL FRENECTOMY   SURGEON:  Surgeon(s): Ocie Doyne, DMD  ANESTHESIA:   local and general  EBL:  minimal  DRAINS: none   SPECIMEN:  No Specimen  COUNTS:  YES  PLAN OF CARE: Discharge to home after PACU  PATIENT DISPOSITION:  PACU - hemodynamically stable.   PROCEDURE DETAILS: Dictation #[id number]  Crystal Snow, DMD 01/15/2023 8:55 AM

## 2023-01-15 NOTE — Op Note (Signed)
NAME: Crystal Snow, Crystal Snow MEDICAL RECORD NO: [id number] ACCOUNT NO: [id number] DATE OF BIRTH: 08/24/1960 FACILITY: [facility] LOCATION: [facility]PER[facility] PHYSICIAN: Georgia Lopes, DDS  Operative Report   DATE OF PROCEDURE: 01/15/2023  PREOPERATIVE DIAGNOSIS:  Nonrestorable teeth secondary to dental caries numbers 5, 6, 7, 8, 9, 12, bilateral mandibular lingual tori, ankyloglossia, hyperplastic maxillary buccal frenum.  POSTOPERATIVE DIAGNOSIS:  Nonrestorable teeth secondary to dental caries numbers 5, 6, 7, 8, 9, 12 bilateral mandibular lingual tori, ankyloglossia, hyperplastic maxillary buccal frenum.  PROCEDURE:  Extraction teeth numbers 5, 6, 7, 8, 9, 12.  Alveoloplasty right and left maxilla, removal of bilateral mandibular lingual tori, lingual frenectomy and maxillary buccal frenectomy.  SURGEON:  Georgia Lopes, DDS  ANESTHESIA:  General, nasal intubation, Dr. Renold Don attending.  DESCRIPTION OF PROCEDURE:  The patient was taken to the operating room and placed on the table in supine position.  General anesthesia was administered and nasal endotracheal tube was placed and secured.  The eyes were protected and the patient was  draped for surgery.  Timeout was performed.  The posterior pharynx was suctioned and a throat pack was placed.  2% lidocaine 1:100,000 epinephrine was infiltrated in an inferior alveolar block and buccal infiltration in the anterior mandible, palatal and  buccal infiltration was given around the maxillary teeth.  A bite block was placed on the right side of the mouth, the left side was operated first.  A 15 blade was used to make an incision over the edentulous left mandibular ridge beginning in the  molar region, carried forward to tooth #22, then the incision was carried lingually around teeth numbers 22 and 23.  The flap was reflected lingually.  There was a large lingual torus.  The Stryker handpiece with a fissure bur was used to make a vertical  cut in line with the  posterior mandible, which was normal in width, this cut was carried down to the inferior cortex of the lingual bone and then was removed with the 301 elevator.  Then, the egg bur was placed and the Stryker handpiece and the bone was  smoothed until the Contour was smooth. The bone rasp was used to further smooth the area.  Then, the area was irrigated and closed with 3-0 chromic.  Then, the bite block was repositioned to the other side of the mouth.  Similar procedure was performed  on the right mandibular lingual torus.  The 15 blade was used to make an incision on the edentulous ridge up to tooth #28 and carried forward to tooth #26.  The flap was reflected lingually and the torus was exposed.  Then, the fissure bur and the  handpiece was used to make a vertical line along the medial aspect of the torus.  Then, the line was deepened with the drill until the bone was almost mobile.  The bone was then cleaved with the 301 elevator and removed from the mouth.  Then, the egg bur  and the bone file were used to further smooth the area and then the area was irrigated and closed with 3-0 chromic.  Then, a lingual frenectomy was performed.  The 15 blade was used to make V-Y incision at the anterior attachment of the frenum to the  attached mucosa.  The area was explored with the cotton applicator tips to increase range of motion and undermine the submucosal tissue.  Then, the flap was lengthened and closed using 4-0 chromic gut interrupted sutures.  Then, attention was turned to  the  maxilla.  The left side was operated first.  A 15 blade was used to make an incision 1 cm proximal to tooth #12 carried forward buccally and palatally in the gingival sulcus around tooth #12 and carried forward to teeth numbers 9, 8, 7.  The  periosteum was reflected.  The teeth were elevated, teeth numbers 7 and 8 were removed using the dental forceps.  However, teeth numbers 9 and 12, required removal of bone circumferentially around  the tooth in order to achieve mobility and then the teeth  were able to be extracted with the upper Universal forceps.  The periosteum was reflected.  There was a large buccal undercut due to bone overgrowth in the area of tooth #12. The Stryker handpiece with the egg bur was used to reduce the bone contour to  remove the undercuts and then the area was irrigated and closed with 3-0 chromic.  The bite block was repositioned to the right side of the mouth and then teeth numbers 5 and 6 were removed using a 15 blade to make a full thickness incision.  The teeth  were elevated.  Tooth #5 was removed with the dental forceps, but tooth #6 required removal of bone with the Stryker handpiece with a fissure bur.  The tooth was then grasped with the rongeur after elevating with the 301 elevator.  Then, the  alveoloplasty was performed using the egg bur followed by the bone file.  Area was irrigated and closed with 3-0 chromic.  Attention was then turned to the maxillary frenum and the incision was made at the attachment point of the frenum to the lowest  point to the crest of the maxillary ridge.  The 301 elevator was then used to reflect the frenum superiorly.  Then, the area was sutured with a 3-0 chromic gut and then the oral cavity was irrigated and suctioned.  Additional local was administered and  the patient was left under the care of anesthesia after removing the throat pack, plans for discharge through day surgery.  ESTIMATED BLOOD LOSS:  Minimum.  COMPLICATIONS:  None.  SPECIMENS:  None.  COUNTS:  Correct.   PUS D: 01/15/2023 9:05:08 am T: 01/15/2023 9:56:00 am  JOB: [id number]/ [id number]

## 2023-01-16 ENCOUNTER — Encounter (HOSPITAL_COMMUNITY): Payer: Self-pay | Admitting: Oral Surgery

## 2023-01-23 ENCOUNTER — Ambulatory Visit: Payer: Medicare Other | Admitting: Internal Medicine

## 2023-01-29 ENCOUNTER — Ambulatory Visit: Payer: 59 | Attending: Internal Medicine | Admitting: Internal Medicine

## 2023-01-29 ENCOUNTER — Encounter: Payer: Self-pay | Admitting: Internal Medicine

## 2023-01-29 VITALS — BP 146/100 | HR 84 | Ht 62.0 in | Wt 213.0 lb

## 2023-01-29 DIAGNOSIS — I5032 Chronic diastolic (congestive) heart failure: Secondary | ICD-10-CM | POA: Diagnosis not present

## 2023-01-29 DIAGNOSIS — I10 Essential (primary) hypertension: Secondary | ICD-10-CM | POA: Diagnosis not present

## 2023-01-29 DIAGNOSIS — I350 Nonrheumatic aortic (valve) stenosis: Secondary | ICD-10-CM

## 2023-01-29 MED ORDER — BLOOD PRESSURE KIT
1.0000 | PACK | Freq: Two times a day (BID) | 0 refills | Status: AC
Start: 2023-01-29 — End: ?

## 2023-01-29 NOTE — Patient Instructions (Addendum)
Medication Instructions:  Your physician recommends that you continue on your current medications as directed. Please refer to the Current Medication list given to you today.   Labwork: None  Testing/Procedures: Your physician has requested that you have an echocardiogram in 1 yr. Echocardiography is a painless test that uses sound waves to create images of your heart. It provides your doctor with information about the size and shape of your heart and how well your heart's chambers and valves are working. This procedure takes approximately one hour. There are no restrictions for this procedure. Please do NOT wear cologne, perfume, aftershave, or lotions (deodorant is allowed). Please arrive 15 minutes prior to your appointment time.   Follow-Up: Your physician recommends that you schedule a follow-up appointment in: 1 year You will receive a reminder call in the mail in about 10 months reminding you to call and schedule your appointment. If you don't receive this call, please contact our office.   Any Other Special Instructions Will Be Listed Below (If Applicable).  Please call office if you experience any dizziness and we will put in order for 2 week Zio monitor at that time. If you need a refill on your cardiac medications before your next appointment, please call your pharmacy.

## 2023-01-29 NOTE — Progress Notes (Signed)
Cardiology Office Note  Date: 01/29/2023   ID: Crystal Snow, DOB Nov 21, 1960, MRN [id number]  PCP:  Billie Lade, MD  Cardiologist:  None Electrophysiologist:  None   Reason for Office Visit: F/u HFimpEF   History of Present Illness: Crystal Snow is a 62 y.o. 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  Patient is here for follow-up visit with me. No interval ER visits or hospitalizations for heart issues. Patient denied any rest or exertional chest discomfort, tightness, heaviness or pressure, rest or exertional dyspnea, palpitations, light-headedness, syncope and LE swelling. Compliant with medications and no side-effects. No bleeding complications.  Past Medical History:  Diagnosis Date   Aortic stenosis    Cardiomyopathy (HCC)    Cholecystitis    lap chole 2006   Chronic kidney disease    CVA (cerebral infarction)    03/2014   Diabetes mellitus without complication (HCC)    Dyslipidemia    Gout    Hypertension    Obesity (BMI 30-39.9)    Obstructive sleep apnea    Stroke [facility])     Past Surgical History:  Procedure Laterality Date   COLONOSCOPY WITH PROPOFOL N/A 03/07/2021   Procedure: COLONOSCOPY WITH PROPOFOL;  Surgeon: Malissa Hippo, MD;  [facility];  Service: Endoscopy;  Laterality: N/A;  10:55   TOOTH EXTRACTION N/A 01/15/2023   Procedure: DENTAL RESTORATION/EXTRACTIONS;  Surgeon: Ocie Doyne, DMD;  Location: [facility] OR;  Service: Oral Surgery;  Laterality: N/A;   TOTAL ABDOMINAL HYSTERECTOMY      Current Outpatient Medications  Medication Sig Dispense Refill   acetaminophen (TYLENOL) 500 MG tablet Take 500-1,000 mg by mouth every 6 (six) hours as needed (pain.).     amLODipine (NORVASC) 5 MG tablet Take 5 mg by mouth in the morning.  3    amoxicillin (AMOXIL) 500 MG capsule Take 1 capsule (500 mg total) by mouth 3 (three) times daily. 21 capsule 0   aspirin EC 81 MG tablet Take 81 mg by mouth in the morning.     atorvastatin (LIPITOR) 80 MG tablet Take 80 mg by mouth in the morning.     Blood Pressure KIT 1 kit by Does not apply route 2 (two) times daily. 1 kit 0   carvedilol (COREG) 25 MG tablet Take 25 mg by mouth 2 (two) times daily.     Cholecalciferol (VITAMIN D3) 50 MCG (2000 UT) TABS Take 2,000 Units by mouth in the morning.     DULoxetine (CYMBALTA) 30 MG capsule Take 30 mg by mouth in the morning.     empagliflozin (JARDIANCE) 25 MG TABS tablet Take 25 mg by mouth daily.     Fiber Adult Gummies 2 g CHEW Chew 2 g by mouth daily.     gabapentin (NEURONTIN) 300 MG capsule Take 300 mg by mouth 2 (two) times daily.     glipiZIDE (GLUCOTROL) 10 MG tablet Take 10 mg by mouth 2 (two) times daily before a meal.  3   HYDROcodone-acetaminophen (NORCO) 5-325 MG tablet Take 1-2 tablets by mouth every 6 (six) hours as needed for moderate pain. 30 tablet 0   ketorolac (ACULAR) 0.5 % ophthalmic solution Place 1 drop into the left eye in the morning and at bedtime.     lisinopril (ZESTRIL)  40 MG tablet Take 40 mg by mouth in the morning.     metFORMIN (GLUCOPHAGE) 1000 MG tablet Take 1 tablet (1,000 mg total) by mouth 2 (two) times daily with a meal. 60 tablet 2   ofloxacin (OCUFLOX) 0.3 % ophthalmic solution Place 1 drop into the left eye in the morning and at bedtime.     prednisoLONE acetate (PRED FORTE) 1 % ophthalmic suspension Place 1 drop into the left eye in the morning and at bedtime.     Semaglutide,0.25 or 0.5MG /DOS, (OZEMPIC, 0.25 OR 0.5 MG/DOSE,) 2 MG/3ML SOPN Inject 0.5 mg into the skin every Monday.     vitamin B-12 (CYANOCOBALAMIN) 1000 MCG tablet Take 1,000 mcg by mouth in the morning.     vitamin C (ASCORBIC ACID) 250 MG tablet Take 250 mg by mouth in the morning.     No current facility-administered medications for  this visit.   Allergies:  Patient has no known allergies.   Social History: The patient  reports that she has never smoked. She has never used smokeless tobacco. She reports that she does not drink alcohol and does not use drugs.   Family History: The patient's family history includes CVA in her mother; Cancer in her brother, father, maternal grandfather, maternal grandmother, paternal grandfather, and paternal grandmother; Diabetes in her maternal grandmother and mother; Hypertension in her mother and sister.   ROS:  Please see the history of present illness. Otherwise, complete review of systems is positive for none.  All other systems are reviewed and negative.   Physical Exam: VS:  BP (!) 146/100   Pulse 84   Ht 5\' 2"  (1.575 m)   Wt 213 lb (96.6 kg)   SpO2 94%   BMI 38.96 kg/m , BMI Body mass index is 38.96 kg/m.  Wt Readings from Last 3 Encounters:  01/29/23 213 lb (96.6 kg)  01/15/23 220 lb (99.8 kg)  11/13/22 221 lb (100.2 kg)    General: Patient appears comfortable at rest. HEENT: Conjunctiva and lids normal, oropharynx clear with moist mucosa. Neck: Supple, no elevated JVP or carotid bruits, no thyromegaly. Lungs: Clear to auscultation, nonlabored breathing at rest. Cardiac: Regular rate and rhythm, no S3 or significant systolic murmur, no pericardial rub. Abdomen: Soft, nontender, no hepatomegaly, bowel sounds present, no guarding or rebound. Extremities: No pitting edema, distal pulses 2+. Skin: Warm and dry. Musculoskeletal: No kyphosis. Neuropsychiatric: Alert and oriented x3, affect grossly appropriate.  Recent Labwork: 10/30/2022: ALT 22; AST 25 01/15/2023: BUN 12; Creatinine, Ser 0.93; Hemoglobin 13.1; Platelets 293; Potassium 4.1; Sodium 139     Component Value Date/Time   CHOL 163 05/30/2022 0000   TRIG 91 05/30/2022 0000   HDL 56 01/24/2015 0523   CHOLHDL 3.4 01/24/2015 0523   VLDL 29 01/24/2015 0523   LDLCALC 80 05/30/2022 0000    Other Studies  Reviewed Today:   Assessment and Plan:  Patient is a 62 year old F known to have HFimpEF (LVEF normalized in 2016), history of TIAs, HTN, DM 2, HLD, mild aortic valve stenosis in 2022 is here for follow-up visit.  # Dizziness # Syncope Plan patient had 1 episode of dizziness followed by unwitnessed syncope around couple of weeks ago (she was getting up from the commode). No prior or recurrent syncopal episodes.  Unclear etiology. EKG showed NSR, no preexcitation and normal QTc interval.  Echocardiogram from 2022 showed normal BiV function, G1 DD and mild aortic valve stenosis. If she develops dizziness next presyncope again, she will benefit  from event monitor.  She is instructed to call the clinic for 2-week event monitor to be shipped to her residence.  # HFimpEF (40 to 45% in 2015 which normalized by 2016) -NM stress test from 2015 showed no evidence of ischemia. -Echocardiogram from 2022 showed normal LVEF, G1 DD, normal RV function, mild aortic valve stenosis with aortic valve V-max 1.96 m/sec and mean AV gradient 7 mmHg. -Continue carvedilol 20 mg twice daily and lisinopril 40 mg once daily.  # Mild aortic valve stenosis -Echocardiogram from 2022 showed normal LVEF, G1 DD, normal RV function, mild aortic valve stenosis with aortic valve V-max 1.96 m/sec and mean AV gradient 7 mmHg. -Obtain 2D echocardiogram in 1 year, in 2025.  # History of TIAs -Continue aspirin 1 mg once daily and atorvastatin 80 mg at bedtime.  # HLD, at goal -Continue atorvastatin 80 mg at bedtime, goal LDL less than 70.  # HTN, controlled -Continue amlodipine 5 mg once daily, carvedilol 20 mg twice daily and lisinopril 40 mg once daily. BP machine prescription. Does not check Bps at home.   I have spent a total of 30 minutes with patient reviewing chart, EKGs, labs and examining patient as well as establishing an assessment and plan that was discussed with the patient.  > 50% of time was spent in direct  patient care.    Medication Adjustments/Labs and Tests Ordered: Current medicines are reviewed at length with the patient today.  Concerns regarding medicines are outlined above.   Tests Ordered: Orders Placed This Encounter  Procedures   EKG 12-Lead   ECHOCARDIOGRAM COMPLETE    Medication Changes: Meds ordered this encounter  Medications   Blood Pressure KIT    Sig: 1 kit by Does not apply route 2 (two) times daily.    Dispense:  1 kit    Refill:  0    Disposition:  Follow up  one year  Signed, Jamyron Redd Verne Spurr, MD, 01/29/2023 9:51 AM    [facility] at [facility] [address] [place], [place] [address]

## 2023-02-13 ENCOUNTER — Ambulatory Visit: Payer: 59 | Admitting: Internal Medicine

## 2023-05-23 ENCOUNTER — Encounter: Payer: Self-pay | Admitting: Internal Medicine

## 2023-05-23 ENCOUNTER — Ambulatory Visit (INDEPENDENT_AMBULATORY_CARE_PROVIDER_SITE_OTHER): Payer: 59 | Admitting: Internal Medicine

## 2023-05-23 VITALS — BP 118/78 | HR 89 | Resp 16 | Ht 62.0 in | Wt 211.0 lb

## 2023-05-23 DIAGNOSIS — E1169 Type 2 diabetes mellitus with other specified complication: Secondary | ICD-10-CM

## 2023-05-23 DIAGNOSIS — Z6838 Body mass index (BMI) 38.0-38.9, adult: Secondary | ICD-10-CM

## 2023-05-23 DIAGNOSIS — E785 Hyperlipidemia, unspecified: Secondary | ICD-10-CM

## 2023-05-23 DIAGNOSIS — R195 Other fecal abnormalities: Secondary | ICD-10-CM | POA: Diagnosis not present

## 2023-05-23 DIAGNOSIS — N182 Chronic kidney disease, stage 2 (mild): Secondary | ICD-10-CM

## 2023-05-23 DIAGNOSIS — Z1159 Encounter for screening for other viral diseases: Secondary | ICD-10-CM | POA: Diagnosis not present

## 2023-05-23 DIAGNOSIS — E559 Vitamin D deficiency, unspecified: Secondary | ICD-10-CM | POA: Diagnosis not present

## 2023-05-23 DIAGNOSIS — I10 Essential (primary) hypertension: Secondary | ICD-10-CM | POA: Diagnosis not present

## 2023-05-23 DIAGNOSIS — E119 Type 2 diabetes mellitus without complications: Secondary | ICD-10-CM | POA: Diagnosis not present

## 2023-05-23 DIAGNOSIS — Z7984 Long term (current) use of oral hypoglycemic drugs: Secondary | ICD-10-CM | POA: Diagnosis not present

## 2023-05-23 DIAGNOSIS — E66812 Obesity, class 2: Secondary | ICD-10-CM

## 2023-05-23 DIAGNOSIS — G629 Polyneuropathy, unspecified: Secondary | ICD-10-CM | POA: Diagnosis not present

## 2023-05-23 MED ORDER — METFORMIN HCL ER 500 MG PO TB24: 1000.0000 mg | ORAL_TABLET | Freq: Two times a day (BID) | ORAL | 2 refills | Status: DC

## 2023-05-23 MED ORDER — OZEMPIC (0.25 OR 0.5 MG/DOSE) 2 MG/3ML ~~LOC~~ SOPN: 0.2500 mg | PEN_INJECTOR | SUBCUTANEOUS | 0 refills | Status: DC

## 2023-05-23 MED ORDER — DULOXETINE HCL 30 MG PO CPEP: 30.0000 mg | ORAL_CAPSULE | Freq: Every morning | ORAL | 1 refills | Status: DC

## 2023-05-23 NOTE — Assessment & Plan Note (Signed)
Lipid panel last updated in October 2023.  Total cholesterol 163 and LDL 80.  She is currently prescribed atorvastatin 80 mg daily.  Repeat lipid panel ordered today.

## 2023-05-23 NOTE — Assessment & Plan Note (Signed)
Chronic issue that she attributes to metformin.  Metformin has been discontinued today in favor of metformin XR.  Follow-up in 3 months for reassessment.

## 2023-05-23 NOTE — Assessment & Plan Note (Signed)
 Remains adequately controlled on current antihypertensive regimen.  No medication changes are indicated today.

## 2023-05-23 NOTE — Assessment & Plan Note (Signed)
Noted on previous labs.  Currently prescribed Jardiance and lisinopril.  Repeat CMP ordered today.

## 2023-05-23 NOTE — Progress Notes (Signed)
Established Patient Office Visit  Subjective   Patient ID: Crystal Snow, female    DOB: 06-10-1961  Age: 62 y.o. MRN: [id number]  Chief Complaint  Patient presents with   upset stomach     States her food goes right through her and she keeps diarrhea   Diabetes   Crystal Snow returns to care today for follow-up.  She was last evaluated by me on 3/27 as a new patient presenting to establish care.  No medication changes were made at that time and 27-month follow-up was arranged.  In the interim, she underwent oral surgery in May.  Seen by cardiology for follow-up in June.  There have otherwise been no acute interval events.  Crystal Snow acute concern today is persistent loose stools.  She is concerned that this is a side effect of metformin.  She is otherwise asymptomatic and has no additional concerns to discuss.  Past Medical History:  Diagnosis Date   Aortic stenosis    Cardiomyopathy (HCC)    Cholecystitis    lap chole 2006   Chronic kidney disease    CVA (cerebral infarction)    03/2014   Diabetes mellitus without complication (HCC)    Dyslipidemia    Gout    Hypertension    Obesity (BMI 30-39.9)    Obstructive sleep apnea    Stroke [facility])    Past Surgical History:  Procedure Laterality Date   COLONOSCOPY WITH PROPOFOL N/A 03/07/2021   Procedure: COLONOSCOPY WITH PROPOFOL;  Surgeon: Malissa Hippo, MD;  [facility];  Service: Endoscopy;  Laterality: N/A;  10:55   TOOTH EXTRACTION N/A 01/15/2023   Procedure: DENTAL RESTORATION/EXTRACTIONS;  Surgeon: Ocie Doyne, DMD;  Location: [facility] OR;  Service: Oral Surgery;  Laterality: N/A;   TOTAL ABDOMINAL HYSTERECTOMY     Social History   Tobacco Use   Smoking status: Never   Smokeless tobacco: Never  Vaping Use   Vaping status: Never Used  Substance Use Topics   Alcohol use: No    Alcohol/week: 0.0 standard drinks of alcohol   Drug use: No   Family History  Problem Relation Age of Onset    Diabetes Mother    CVA Mother    Hypertension Mother    Cancer Father    Hypertension Sister    Cancer Brother    Cancer Maternal Grandmother    Diabetes Maternal Grandmother    Cancer Maternal Grandfather    Cancer Paternal Grandmother    Cancer Paternal Grandfather    No Known Allergies  Review of Systems  Gastrointestinal:        Loose stools  All other systems reviewed and are negative.    Objective:     BP 118/78   Pulse 89   Resp 16   Ht 5\' 2"  (1.575 m)   Wt 211 lb (95.7 kg)   SpO2 92%   BMI 38.59 kg/m  BP Readings from Last 3 Encounters:  05/23/23 118/78  01/29/23 (!) 146/100  01/15/23 (!) 156/90   Physical Exam Vitals reviewed.  Constitutional:      General: She is not in acute distress.    Appearance: Normal appearance. She is obese. She is not toxic-appearing.  HENT:     Head: Normocephalic and atraumatic.     Right Ear: External ear normal.     Left Ear: External ear normal.     Nose: Nose normal. No congestion or rhinorrhea.     Mouth/Throat:     Mouth:  Mucous membranes are moist.     Pharynx: Oropharynx is clear. No oropharyngeal exudate or posterior oropharyngeal erythema.  Eyes:     General: No scleral icterus.    Extraocular Movements: Extraocular movements intact.     Conjunctiva/sclera: Conjunctivae normal.     Pupils: Pupils are equal, round, and reactive to light.  Cardiovascular:     Rate and Rhythm: Normal rate and regular rhythm.     Pulses: Normal pulses.     Heart sounds: Murmur heard.     No friction rub. No gallop.  Pulmonary:     Effort: Pulmonary effort is normal.     Breath sounds: Normal breath sounds. No wheezing, rhonchi or rales.  Abdominal:     General: Abdomen is flat. Bowel sounds are normal. There is no distension.     Palpations: Abdomen is soft.     Tenderness: There is no abdominal tenderness.  Musculoskeletal:        General: No swelling. Normal range of motion.     Cervical back: Normal range of motion.      Right lower leg: No edema.     Left lower leg: No edema.  Lymphadenopathy:     Cervical: No cervical adenopathy.  Skin:    General: Skin is warm and dry.     Capillary Refill: Capillary refill takes less than 2 seconds.     Coloration: Skin is not jaundiced.  Neurological:     General: No focal deficit present.     Mental Status: She is alert and oriented to person, place, and time.  Psychiatric:        Mood and Affect: Mood normal.        Behavior: Behavior normal.   Last CBC Lab Results  Component Value Date   WBC 7.4 01/15/2023   HGB 13.1 01/15/2023   HCT 42.4 01/15/2023   MCV 85.7 01/15/2023   MCH 26.5 01/15/2023   RDW 15.2 01/15/2023   PLT 293 01/15/2023   Last metabolic panel Lab Results  Component Value Date   GLUCOSE 118 (H) 01/15/2023   NA 139 01/15/2023   K 4.1 01/15/2023   CL 102 01/15/2023   CO2 25 01/15/2023   BUN 12 01/15/2023   CREATININE 0.93 01/15/2023   GFRNONAA >60 01/15/2023   CALCIUM 9.6 01/15/2023   PROT 7.3 10/30/2022   ALBUMIN 3.7 10/30/2022   BILITOT 0.5 10/30/2022   ALKPHOS 58 10/30/2022   AST 25 10/30/2022   ALT 22 10/30/2022   ANIONGAP 12 01/15/2023   Last lipids Lab Results  Component Value Date   CHOL 163 05/30/2022   HDL 56 01/24/2015   LDLCALC 80 05/30/2022   TRIG 91 05/30/2022   CHOLHDL 3.4 01/24/2015   Last hemoglobin A1c Lab Results  Component Value Date   HGBA1C 6.5 10/02/2022   Last thyroid functions Lab Results  Component Value Date   TSH 0.892 01/24/2015   Last vitamin B12 and Folate Lab Results  Component Value Date   VITAMINB12 386 01/24/2015     Assessment & Plan:   Problem List Items Addressed This Visit       Essential hypertension - Primary (Chronic)    Remains adequately controlled on current antihypertensive regimen.  No medication changes are indicated today.      Type 2 diabetes mellitus without complication (HCC)    A1c 6.5 on labs from February.  She is currently prescribed metformin  1000 mg twice daily, Ozempic 0.5 mg weekly, glipizide 10 mg twice  daily, and Jardiance 25 mg daily.  She reports that she has not taken Ozempic in over a month.  Her acute concern is loose stools, which she attributes to metformin. -Repeat A1c and urine microalbumin/creatinine ratio ordered today -Through shared decision making, metformin has been discontinued in favor of metformin XR 1000 mg twice daily -She is in agreement with resuming Ozempic 0.25 mg weekly -Follow-up in 3 months for reassessment      Hyperlipidemia associated with type 2 diabetes mellitus (HCC)    Lipid panel last updated in October 2023.  Total cholesterol 163 and LDL 80.  She is currently prescribed atorvastatin 80 mg daily.  Repeat lipid panel ordered today.      Neuropathy    Symptoms remain adequately controlled with gabapentin 300 mg twice daily and Cymbalta 30 mg daily.  Cymbalta has been refilled today.      CKD (chronic kidney disease), stage II    Noted on previous labs.  Currently prescribed Jardiance and lisinopril.  Repeat CMP ordered today.      Loose stools    Chronic issue that she attributes to metformin.  Metformin has been discontinued today in favor of metformin XR.  Follow-up in 3 months for reassessment.      Return in about 3 months (around 08/23/2023).   Billie Lade, MD

## 2023-05-23 NOTE — Patient Instructions (Signed)
It was a pleasure to see you today.  Thank you for giving [place] the opportunity to be involved in your care.  Below is a brief recap of your visit and next steps.  We will plan to see you again in 3 months.  Summary Discontinue metformin. Start metformin XR 1000 mg twice daily Restart Ozempic Cymbalta refilled Repeat labs ordered Follow up in 3 months

## 2023-05-23 NOTE — Assessment & Plan Note (Addendum)
A1c 6.5 on labs from February.  She is currently prescribed metformin 1000 mg twice daily, Ozempic 0.5 mg weekly, glipizide 10 mg twice daily, and Jardiance 25 mg daily.  She reports that she has not taken Ozempic in over a month.  Her acute concern is loose stools, which she attributes to metformin. -Repeat A1c and urine microalbumin/creatinine ratio ordered today -Through shared decision making, metformin has been discontinued in favor of metformin XR 1000 mg twice daily -She is in agreement with resuming Ozempic 0.25 mg weekly -Follow-up in 3 months for reassessment

## 2023-05-23 NOTE — Assessment & Plan Note (Signed)
Symptoms remain adequately controlled with gabapentin 300 mg twice daily and Cymbalta 30 mg daily.  Cymbalta has been refilled today.

## 2023-05-25 LAB — VITAMIN D 25 HYDROXY (VIT D DEFICIENCY, FRACTURES): Vit D, 25-Hydroxy: 19.5 ng/mL — ABNORMAL LOW (ref 30.0–100.0)

## 2023-05-25 LAB — CBC WITH DIFFERENTIAL/PLATELET
Basophils Absolute: 0 10*3/uL (ref 0.0–0.2)
Basos: 1 %
EOS (ABSOLUTE): 0.1 10*3/uL (ref 0.0–0.4)
Eos: 1 %
Hematocrit: 41.2 % (ref 34.0–46.6)
Hemoglobin: 13.1 g/dL (ref 11.1–15.9)
Immature Grans (Abs): 0 10*3/uL (ref 0.0–0.1)
Immature Granulocytes: 0 %
Lymphocytes Absolute: 2.6 10*3/uL (ref 0.7–3.1)
Lymphs: 51 %
MCH: 27.5 pg (ref 26.6–33.0)
MCHC: 31.8 g/dL (ref 31.5–35.7)
MCV: 87 fL (ref 79–97)
Monocytes Absolute: 0.4 10*3/uL (ref 0.1–0.9)
Monocytes: 7 %
Neutrophils Absolute: 2 10*3/uL (ref 1.4–7.0)
Neutrophils: 40 %
Platelets: 339 10*3/uL (ref 150–450)
RBC: 4.76 x10E6/uL (ref 3.77–5.28)
RDW: 14.6 % (ref 11.7–15.4)
WBC: 5.1 10*3/uL (ref 3.4–10.8)

## 2023-05-25 LAB — MICROALBUMIN / CREATININE URINE RATIO
Creatinine, Urine: 203.2 mg/dL
Microalb/Creat Ratio: 25 mg/g{creat} (ref 0–29)
Microalbumin, Urine: 51.3 ug/mL

## 2023-05-25 LAB — LIPID PANEL
Chol/HDL Ratio: 2.7 {ratio} (ref 0.0–4.4)
Cholesterol, Total: 158 mg/dL (ref 100–199)
HDL: 59 mg/dL (ref >39)
LDL Chol Calc (NIH): 84 mg/dL (ref 0–99)
Triglycerides: 82 mg/dL (ref 0–149)
VLDL Cholesterol Cal: 15 mg/dL (ref 5–40)

## 2023-05-25 LAB — CMP14+EGFR
ALT: 14 [IU]/L (ref 0–32)
AST: 17 [IU]/L (ref 0–40)
Albumin: 4.3 g/dL (ref 3.9–4.9)
Alkaline Phosphatase: 67 [IU]/L (ref 44–121)
BUN/Creatinine Ratio: 13 (ref 12–28)
BUN: 14 mg/dL (ref 8–27)
Bilirubin Total: 0.4 mg/dL (ref 0.0–1.2)
CO2: 22 mmol/L (ref 20–29)
Calcium: 9.2 mg/dL (ref 8.7–10.3)
Chloride: 107 mmol/L — ABNORMAL HIGH (ref 96–106)
Creatinine, Ser: 1.06 mg/dL — ABNORMAL HIGH (ref 0.57–1.00)
Globulin, Total: 3 g/dL (ref 1.5–4.5)
Glucose: 138 mg/dL — ABNORMAL HIGH (ref 70–99)
Potassium: 3.7 mmol/L (ref 3.5–5.2)
Sodium: 144 mmol/L (ref 134–144)
Total Protein: 7.3 g/dL (ref 6.0–8.5)
eGFR: 59 mL/min/{1.73_m2} — ABNORMAL LOW (ref >59)

## 2023-05-25 LAB — HEMOGLOBIN A1C
Est. average glucose Bld gHb Est-mCnc: 137 mg/dL
Hgb A1c MFr Bld: 6.4 % — ABNORMAL HIGH (ref 4.8–5.6)

## 2023-05-25 LAB — HCV INTERPRETATION

## 2023-05-25 LAB — TSH+FREE T4
Free T4: 1.21 ng/dL (ref 0.82–1.77)
TSH: 1.15 u[IU]/mL (ref 0.450–4.500)

## 2023-05-25 LAB — B12 AND FOLATE PANEL
Folate: 11.2 ng/mL (ref >3.0)
Vitamin B-12: 337 pg/mL (ref 232–1245)

## 2023-05-25 LAB — HCV AB W REFLEX TO QUANT PCR: HCV Ab: NONREACTIVE

## 2023-05-26 DIAGNOSIS — H2511 Age-related nuclear cataract, right eye: Secondary | ICD-10-CM | POA: Diagnosis not present

## 2023-05-27 ENCOUNTER — Other Ambulatory Visit: Payer: Self-pay | Admitting: Internal Medicine

## 2023-05-27 DIAGNOSIS — E559 Vitamin D deficiency, unspecified: Secondary | ICD-10-CM

## 2023-05-27 MED ORDER — VITAMIN D (ERGOCALCIFEROL) 1.25 MG (50000 UNIT) PO CAPS
50000.0000 [IU] | ORAL_CAPSULE | ORAL | 0 refills | Status: DC
Start: 2023-05-27 — End: 2023-08-26

## 2023-06-05 DIAGNOSIS — H2511 Age-related nuclear cataract, right eye: Secondary | ICD-10-CM | POA: Diagnosis not present

## 2023-08-21 ENCOUNTER — Other Ambulatory Visit: Payer: Self-pay | Admitting: Internal Medicine

## 2023-08-21 MED ORDER — ATORVASTATIN CALCIUM 80 MG PO TABS: 80.0000 mg | ORAL_TABLET | Freq: Every morning | ORAL | 0 refills | Status: DC

## 2023-08-21 MED ORDER — GLIPIZIDE 10 MG PO TABS: 10.0000 mg | ORAL_TABLET | Freq: Two times a day (BID) | ORAL | 3 refills | Status: DC

## 2023-08-21 NOTE — Telephone Encounter (Signed)
 Copied from CRM [id number]. Topic: Clinical - Medication Refill >> Aug 21, 2023 12:49 PM Elle L wrote: Most Recent Primary Care Visit:  Provider: DIXON, PHILLIP E  Department: [facility]  Visit Type: OFFICE VISIT  Date: 05/23/2023  Medication: atorvastatin  (LIPITOR) 80 MG tablet AND glipiZIDE  (GLUCOTROL ) 10 MG tablet   Has the patient contacted their pharmacy? Yes   Is this the correct pharmacy for this prescription? Yes  This is the patient's preferred pharmacy:  [id number] - [place], [place] - [address]  [place] [address] Phone: [phone] Fax: [phone]   Has the prescription been filled recently? Yes  Is the patient out of the medication?   Has the patient been seen for an appointment in the last year OR does the patient have an upcoming appointment?   Can we respond through MyChart?   Agent: Please be advised that Rx refills may take up to 3 business days. We ask that you follow-up with your pharmacy.

## 2023-08-22 ENCOUNTER — Other Ambulatory Visit: Payer: Self-pay | Admitting: Internal Medicine

## 2023-08-26 ENCOUNTER — Encounter: Payer: Self-pay | Admitting: Internal Medicine

## 2023-08-26 ENCOUNTER — Ambulatory Visit: Payer: 59 | Admitting: Internal Medicine

## 2023-08-26 VITALS — BP 136/80 | HR 108 | Ht 62.0 in | Wt 219.0 lb

## 2023-08-26 DIAGNOSIS — I10 Essential (primary) hypertension: Secondary | ICD-10-CM | POA: Diagnosis not present

## 2023-08-26 DIAGNOSIS — G629 Polyneuropathy, unspecified: Secondary | ICD-10-CM | POA: Diagnosis not present

## 2023-08-26 DIAGNOSIS — E785 Hyperlipidemia, unspecified: Secondary | ICD-10-CM | POA: Diagnosis not present

## 2023-08-26 DIAGNOSIS — Z23 Encounter for immunization: Secondary | ICD-10-CM | POA: Insufficient documentation

## 2023-08-26 DIAGNOSIS — E119 Type 2 diabetes mellitus without complications: Secondary | ICD-10-CM

## 2023-08-26 DIAGNOSIS — E1169 Type 2 diabetes mellitus with other specified complication: Secondary | ICD-10-CM | POA: Diagnosis not present

## 2023-08-26 DIAGNOSIS — Z7984 Long term (current) use of oral hypoglycemic drugs: Secondary | ICD-10-CM | POA: Diagnosis not present

## 2023-08-26 MED ORDER — GABAPENTIN 300 MG PO CAPS: 300.0000 mg | ORAL_CAPSULE | Freq: Two times a day (BID) | ORAL | 2 refills | Status: DC

## 2023-08-26 MED ORDER — OZEMPIC (0.25 OR 0.5 MG/DOSE) 2 MG/3ML ~~LOC~~ SOPN: 0.5000 mg | PEN_INJECTOR | SUBCUTANEOUS | 1 refills | Status: DC

## 2023-08-26 MED ORDER — AMLODIPINE BESYLATE 5 MG PO TABS: 5.0000 mg | ORAL_TABLET | Freq: Every morning | ORAL | 3 refills | Status: DC

## 2023-08-26 MED ORDER — DULOXETINE HCL 30 MG PO CPEP: 30.0000 mg | ORAL_CAPSULE | Freq: Every morning | ORAL | 1 refills | Status: DC

## 2023-08-26 MED ORDER — LISINOPRIL 40 MG PO TABS: 40.0000 mg | ORAL_TABLET | Freq: Every morning | ORAL | 3 refills | Status: DC

## 2023-08-26 MED ORDER — CARVEDILOL 25 MG PO TABS: 25.0000 mg | ORAL_TABLET | Freq: Two times a day (BID) | ORAL | 1 refills | Status: DC

## 2023-08-26 MED ORDER — ATORVASTATIN CALCIUM 80 MG PO TABS: 80.0000 mg | ORAL_TABLET | Freq: Every morning | ORAL | 3 refills | Status: DC

## 2023-08-26 MED ORDER — EMPAGLIFLOZIN 25 MG PO TABS: 25.0000 mg | ORAL_TABLET | Freq: Every day | ORAL | 2 refills | Status: DC

## 2023-08-26 MED ORDER — METFORMIN HCL ER 500 MG PO TB24
500.0000 mg | ORAL_TABLET | Freq: Two times a day (BID) | ORAL | 2 refills | Status: DC
Start: 2023-08-26 — End: 2024-01-16

## 2023-08-26 NOTE — Progress Notes (Signed)
 Established Patient Office Visit  Subjective   Patient ID: Crystal Snow, female    DOB: [birth year]  Age: 63 y.o. MRN: [id number]  Chief Complaint  Patient presents with   Hypertension    Three month follow up    Crystal Snow returns to care today for routine follow-up.  She was last evaluated by me in October 2024.  Metformin  was switched to extended release in the setting of loose stools and Ozempic  was resumed.  No additional changes were made and 65-month follow-up was arranged.  Crystal Snow reports feeling well today.  She is asymptomatic and has no acute concerns to discuss aside from requesting medication refills.  She reports that loose stools have resolved since switching to extended release metformin .  Past Medical History:  Diagnosis Date   Aortic stenosis    Cardiomyopathy (HCC)    Cholecystitis    lap chole 2006   Chronic kidney disease    CVA (cerebral infarction)    03/2014   Diabetes mellitus without complication (HCC)    Dyslipidemia    Gout    Hypertension    Obesity (BMI 30-39.9)    Obstructive sleep apnea    Stroke [facility])    Past Surgical History:  Procedure Laterality Date   COLONOSCOPY WITH PROPOFOL  N/A 03/07/2021   Procedure: COLONOSCOPY WITH PROPOFOL ;  Surgeon: Golda Claudis PENNER, MD;  Location: [facility];  Service: Endoscopy;  Laterality: N/A;  10:55   TOOTH EXTRACTION N/A 01/15/2023   Procedure: DENTAL RESTORATION/EXTRACTIONS;  Surgeon: Sheryle Hamilton, DMD;  Location: [facility] OR;  Service: Oral Surgery;  Laterality: N/A;   TOTAL ABDOMINAL HYSTERECTOMY     Social History   Tobacco Use   Smoking status: Never   Smokeless tobacco: Never  Vaping Use   Vaping status: Never Used  Substance Use Topics   Alcohol use: No    Alcohol/week: 0.0 standard drinks of alcohol   Drug use: No   Family History  Problem Relation Age of Onset   Diabetes Mother    CVA Mother    Hypertension Mother    Cancer Father    Hypertension Sister    Cancer  Brother    Cancer Maternal Grandmother    Diabetes Maternal Grandmother    Cancer Maternal Grandfather    Cancer Paternal Grandmother    Cancer Paternal Grandfather    No Known Allergies  Review of Systems  Constitutional:  Negative for chills and fever.  HENT:  Negative for sore throat.   Respiratory:  Negative for cough and shortness of breath.   Cardiovascular:  Negative for chest pain, palpitations and leg swelling.  Gastrointestinal:  Negative for abdominal pain, blood in stool, constipation, diarrhea, nausea and vomiting.  Genitourinary:  Negative for dysuria and hematuria.  Musculoskeletal:  Negative for myalgias.  Skin:  Negative for itching and rash.  Neurological:  Negative for dizziness and headaches.  Psychiatric/Behavioral:  Negative for depression and suicidal ideas.      Objective:     BP 136/80   Pulse (!) 108   Ht 5' 2 (1.575 m)   Wt 219 lb (99.3 kg)   SpO2 91%   BMI 40.06 kg/m  BP Readings from Last 3 Encounters:  08/26/23 136/80  05/23/23 118/78  01/29/23 (!) 146/100   Physical Exam Vitals reviewed.  Constitutional:      General: She is not in acute distress.    Appearance: Normal appearance. She is obese. She is not toxic-appearing.  HENT:  Head: Normocephalic and atraumatic.     Right Ear: External ear normal.     Left Ear: External ear normal.     Nose: Nose normal. No congestion or rhinorrhea.     Mouth/Throat:     Mouth: Mucous membranes are moist.     Pharynx: Oropharynx is clear. No oropharyngeal exudate or posterior oropharyngeal erythema.  Eyes:     General: No scleral icterus.    Extraocular Movements: Extraocular movements intact.     Conjunctiva/sclera: Conjunctivae normal.     Pupils: Pupils are equal, round, and reactive to light.  Cardiovascular:     Rate and Rhythm: Normal rate and regular rhythm.     Pulses: Normal pulses.     Heart sounds: Murmur heard.     No friction rub. No gallop.  Pulmonary:     Effort:  Pulmonary effort is normal.     Breath sounds: Normal breath sounds. No wheezing, rhonchi or rales.  Abdominal:     General: Abdomen is flat. Bowel sounds are normal. There is no distension.     Palpations: Abdomen is soft.     Tenderness: There is no abdominal tenderness.  Musculoskeletal:        General: No swelling. Normal range of motion.     Cervical back: Normal range of motion.     Right lower leg: No edema.     Left lower leg: No edema.  Lymphadenopathy:     Cervical: No cervical adenopathy.  Skin:    General: Skin is warm and dry.     Capillary Refill: Capillary refill takes less than 2 seconds.     Coloration: Skin is not jaundiced.  Neurological:     General: No focal deficit present.     Mental Status: She is alert and oriented to person, place, and time.  Psychiatric:        Mood and Affect: Mood normal.        Behavior: Behavior normal.   Diabetic foot exam was performed.  No deformities or other abnormal visual findings.  Posterior tibialis and dorsalis pulse intact bilaterally.  Intact to touch and monofilament testing bilaterally.    Last CBC Lab Results  Component Value Date   WBC 5.1 05/23/2023   HGB 13.1 05/23/2023   HCT 41.2 05/23/2023   MCV 87 05/23/2023   MCH 27.5 05/23/2023   RDW 14.6 05/23/2023   PLT 339 05/23/2023   Last metabolic panel Lab Results  Component Value Date   GLUCOSE 138 (H) 05/23/2023   NA 144 05/23/2023   K 3.7 05/23/2023   CL 107 (H) 05/23/2023   CO2 22 05/23/2023   BUN 14 05/23/2023   CREATININE 1.06 (H) 05/23/2023   EGFR 59 (L) 05/23/2023   CALCIUM  9.2 05/23/2023   PROT 7.3 05/23/2023   ALBUMIN 4.3 05/23/2023   LABGLOB 3.0 05/23/2023   BILITOT 0.4 05/23/2023   ALKPHOS 67 05/23/2023   AST 17 05/23/2023   ALT 14 05/23/2023   ANIONGAP 12 01/15/2023   Last lipids Lab Results  Component Value Date   CHOL 158 05/23/2023   HDL 59 05/23/2023   LDLCALC 84 05/23/2023   TRIG 82 05/23/2023   CHOLHDL 2.7 05/23/2023    Last hemoglobin A1c Lab Results  Component Value Date   HGBA1C 6.4 (H) 05/23/2023   Last thyroid  functions Lab Results  Component Value Date   TSH 1.150 05/23/2023   Last vitamin D  Lab Results  Component Value Date   VD25OH 19.5 (L)  05/23/2023   Last vitamin B12 and Folate Lab Results  Component Value Date   VITAMINB12 337 05/23/2023   FOLATE 11.2 05/23/2023     Assessment & Plan:   Problem List Items Addressed This Visit       Essential hypertension (Chronic)   Remains adequately controlled on current antihypertensive regimen.      Type 2 diabetes mellitus without complication (HCC) - Primary   A1c 6.4 on labs from October.  She is currently prescribed metformin  XR 500 mg twice daily, Ozempic  0.25 mg weekly, glipizide  10 mg twice daily, and Jardiance  25 mg daily.  Loose stools have improved since switching to extended release metformin .  She checks her blood sugar every morning and readings are well within goal.  Her blood sugar this morning was 102. -Increase Ozempic  to 0.5 mg weekly.  Discontinue glipizide  due to potential risk of hypoglycemia.  Can further increase Ozempic  if needed. -Repeat A1c at follow-up in 3 months -Diabetic foot exam completed today      Hyperlipidemia associated with type 2 diabetes mellitus (HCC)   Lipid panel updated in October.  Total cholesterol 158 and LDL 84.  She remains on atorvastatin  80 mg daily.      Neuropathy   Adequately controlled with gabapentin  300 mg twice daily and Cymbalta  30 mg daily.      Need for influenza vaccination   Influenza vaccine administered today      Return in about 3 months (around 11/24/2023).   Manus FORBES Fireman, MD

## 2023-08-26 NOTE — Addendum Note (Signed)
 Addended by: Christel Mormon E on: 08/26/2023 01:59 PM   Modules accepted: Level of Service

## 2023-08-26 NOTE — Assessment & Plan Note (Signed)
 Lipid panel updated in October.  Total cholesterol 158 and LDL 84.  She remains on atorvastatin 80 mg daily.

## 2023-08-26 NOTE — Assessment & Plan Note (Signed)
 Remains adequately controlled on current antihypertensive regimen.

## 2023-08-26 NOTE — Assessment & Plan Note (Signed)
 Influenza vaccine administered today.

## 2023-08-26 NOTE — Patient Instructions (Signed)
 It was a pleasure to see you today.  Thank you for giving us  the opportunity to be involved in your care.  Below is a brief recap of your visit and next steps.  We will plan to see you again in 3 months.  Summary Increase Ozempic  to 0.5 mg weekly Discontinue Glipizide  Flu shot today Follow up in 3 months

## 2023-08-26 NOTE — Assessment & Plan Note (Signed)
 Adequately controlled with gabapentin 300 mg twice daily and Cymbalta 30 mg daily.

## 2023-08-26 NOTE — Assessment & Plan Note (Signed)
 A1c 6.4 on labs from October.  She is currently prescribed metformin  XR 500 mg twice daily, Ozempic  0.25 mg weekly, glipizide  10 mg twice daily, and Jardiance  25 mg daily.  Loose stools have improved since switching to extended release metformin .  She checks her blood sugar every morning and readings are well within goal.  Her blood sugar this morning was 102. -Increase Ozempic  to 0.5 mg weekly.  Discontinue glipizide  due to potential risk of hypoglycemia.  Can further increase Ozempic  if needed. -Repeat A1c at follow-up in 3 months -Diabetic foot exam completed today

## 2023-11-25 ENCOUNTER — Ambulatory Visit: Payer: 59 | Admitting: Internal Medicine

## 2023-11-27 ENCOUNTER — Other Ambulatory Visit: Payer: Self-pay | Admitting: Internal Medicine

## 2023-12-02 ENCOUNTER — Ambulatory Visit (INDEPENDENT_AMBULATORY_CARE_PROVIDER_SITE_OTHER): Payer: 59

## 2023-12-02 VITALS — Ht 64.0 in | Wt 215.0 lb

## 2023-12-02 DIAGNOSIS — Z1231 Encounter for screening mammogram for malignant neoplasm of breast: Secondary | ICD-10-CM

## 2023-12-02 DIAGNOSIS — Z780 Asymptomatic menopausal state: Secondary | ICD-10-CM

## 2023-12-02 DIAGNOSIS — Z0000 Encounter for general adult medical examination without abnormal findings: Secondary | ICD-10-CM | POA: Diagnosis not present

## 2023-12-02 DIAGNOSIS — E119 Type 2 diabetes mellitus without complications: Secondary | ICD-10-CM

## 2023-12-02 NOTE — Progress Notes (Signed)
 Because this visit was a virtual/telehealth visit,  certain criteria was not obtained, such a blood pressure, CBG if applicable, and timed get up and go. Any medications not marked as "taking" were not mentioned during the medication reconciliation part of the visit. Any vitals not documented were not able to be obtained due to this being a telehealth visit or patient was unable to self-report a recent blood pressure reading due to a lack of equipment at home via telehealth. Vitals that have been documented are verbally provided by the patient.   Subjective:   Crystal Snow is a 63 y.o. who presents for a Medicare Wellness preventive visit.  Visit Complete: Virtual I connected with  Crystal Snow on 12/02/23 by a audio enabled telemedicine application and verified that I am speaking with the correct person using two identifiers.  Patient Location: Home  Provider Location: Home Office  I discussed the limitations of evaluation and management by telemedicine. The patient expressed understanding and agreed to proceed.  Vital Signs: Because this visit was a virtual/telehealth visit, some criteria may be missing or patient reported. Any vitals not documented were not able to be obtained and vitals that have been documented are patient reported.  VideoDeclined- This patient declined Librarian, academic. Therefore the visit was completed with audio only.  Persons Participating in Visit: Patient.  AWV Questionnaire: No: Patient Medicare AWV questionnaire was not completed prior to this visit.  Cardiac Risk Factors include: advanced age (>27men, >21 women);diabetes mellitus;dyslipidemia;hypertension;obesity (BMI >30kg/m2);sedentary lifestyle;Other (see comment), Risk factor comments: OSA     Objective:    Today's Vitals   12/02/23 1553  Weight: 215 lb (97.5 kg)  Height: 5\' 4"  (1.626 m)   Body mass index is 36.9 kg/m.     12/02/2023    3:59 PM  01/15/2023    6:25 AM 11/18/2022    3:39 PM 10/30/2022    7:21 AM 03/07/2021    9:09 AM 03/06/2021    3:09 PM 03/26/2016    9:17 AM  Advanced Directives  Does Patient Have a Medical Advance Directive? No No No No No No No  Would patient like information on creating a medical advance directive? No - Patient declined No - Patient declined Yes (ED - Information included in AVS) Yes (ED - Information included in AVS) No - Patient declined No - Patient declined No - patient declined information    Current Medications (verified) Outpatient Encounter Medications as of 12/02/2023  Medication Sig   acetaminophen (TYLENOL) 500 MG tablet Take 500-1,000 mg by mouth every 6 (six) hours as needed (pain.).   amLODipine (NORVASC) 5 MG tablet Take 1 tablet (5 mg total) by mouth in the morning.   aspirin EC 81 MG tablet Take 81 mg by mouth in the morning.   atorvastatin (LIPITOR) 80 MG tablet Take 1 tablet (80 mg total) by mouth in the morning.   Blood Pressure KIT 1 kit by Does not apply route 2 (two) times daily.   carvedilol (COREG) 25 MG tablet Take 1 tablet (25 mg total) by mouth 2 (two) times daily.   Cholecalciferol (VITAMIN D3) 50 MCG (2000 UT) TABS Take 2,000 Units by mouth in the morning.   DULoxetine (CYMBALTA) 30 MG capsule Take 1 capsule (30 mg total) by mouth in the morning.   empagliflozin (JARDIANCE) 25 MG TABS tablet Take 1 tablet (25 mg total) by mouth daily.   Fiber Adult Gummies 2 g CHEW Chew 2 g by mouth daily.  gabapentin (NEURONTIN) 300 MG capsule Take 1 capsule (300 mg total) by mouth 2 (two) times daily.   lisinopril (ZESTRIL) 40 MG tablet Take 1 tablet (40 mg total) by mouth in the morning.   Semaglutide,0.25 or 0.5MG /DOS, (OZEMPIC, 0.25 OR 0.5 MG/DOSE,) 2 MG/3ML SOPN Inject 0.5 mg into the skin every Monday.   vitamin B-12 (CYANOCOBALAMIN) 1000 MCG tablet Take 1,000 mcg by mouth in the morning.   vitamin C (ASCORBIC ACID) 250 MG tablet Take 250 mg by mouth in the morning.   metFORMIN  (GLUCOPHAGE-XR) 500 MG 24 hr tablet Take 1 tablet (500 mg total) by mouth 2 (two) times daily with a meal.   No facility-administered encounter medications on file as of 12/02/2023.    Allergies (verified) Patient has no known allergies.   History: Past Medical History:  Diagnosis Date   Aortic stenosis    Cardiomyopathy (HCC)    Cholecystitis    lap chole 2006   Chronic kidney disease    CVA (cerebral infarction)    03/2014   Diabetes mellitus without complication (HCC)    Dyslipidemia    Gout    Hypertension    Obesity (BMI 30-39.9)    Obstructive sleep apnea    Stroke [facility])    Past Surgical History:  Procedure Laterality Date   COLONOSCOPY WITH PROPOFOL N/A 03/07/2021   Procedure: COLONOSCOPY WITH PROPOFOL;  Surgeon: Malissa Hippo, MD;  [facility];  Service: Endoscopy;  Laterality: N/A;  10:55   TOOTH EXTRACTION N/A 01/15/2023   Procedure: DENTAL RESTORATION/EXTRACTIONS;  Surgeon: Ocie Doyne, DMD;  Location: [facility] OR;  Service: Oral Surgery;  Laterality: N/A;   TOTAL ABDOMINAL HYSTERECTOMY     Family History  Problem Relation Age of Onset   Diabetes Mother    CVA Mother    Hypertension Mother    Cancer Father    Hypertension Sister    Cancer Brother    Cancer Maternal Grandmother    Diabetes Maternal Grandmother    Cancer Maternal Grandfather    Cancer Paternal Grandmother    Cancer Paternal Grandfather    Social History   Socioeconomic History   Marital status: Single    Spouse name: Not on file   Number of children: Not on file   Years of education: Not on file   Highest education level: Not on file  Occupational History   Not on file  Tobacco Use   Smoking status: Never   Smokeless tobacco: Never  Vaping Use   Vaping status: Never Used  Substance and Sexual Activity   Alcohol use: No    Alcohol/week: 0.0 standard drinks of alcohol   Drug use: No   Sexual activity: Not on file  Other Topics Concern   Not on file  Social History  Narrative   Not on file   Social Drivers of Health   Financial Resource Strain: Low Risk  (12/02/2023)   Overall Financial Resource Strain (CARDIA)    Difficulty of Paying Living Expenses: Not hard at all  Food Insecurity: No Food Insecurity (12/02/2023)   Hunger Vital Sign    Worried About Running Out of Food in the Last Year: Never true    Ran Out of Food in the Last Year: Never true  Transportation Needs: No Transportation Needs (12/02/2023)   PRAPARE - Administrator, Civil Service (Medical): No    Lack of Transportation (Non-Medical): No  Physical Activity: Insufficiently Active (12/02/2023)   Exercise Vital Sign  Days of Exercise per Week: 2 days    Minutes of Exercise per Session: 20 min  Stress: No Stress Concern Present (12/02/2023)   [employer] of Occupational Health - Occupational Stress Questionnaire    Feeling of Stress : Not at all  Social Connections: Moderately Integrated (12/02/2023)   Social Connection and Isolation Panel [NHANES]    Frequency of Communication with Friends and Family: Once a week    Frequency of Social Gatherings with Friends and Family: More than three times a week    Attends Religious Services: More than 4 times per year    Active Member of [employer] or Organizations: Yes    Attends Engineer, structural: More than 4 times per year    Marital Status: Never married    Tobacco Counseling Counseling given: Yes    Clinical Intake:  Pre-visit preparation completed: Yes  Pain : No/denies pain     BMI - recorded: 36.9 Nutritional Status: BMI > 30  Obese Nutritional Risks: None Diabetes: Yes CBG done?: No (telehealth visit. unable to obtain cbg) Did pt. bring in CBG monitor from home?: No  Lab Results  Component Value Date   HGBA1C 6.4 (H) 05/23/2023   HGBA1C 6.5 10/02/2022   HGBA1C 7.6 (H) 01/23/2015     How often do you need to have someone help you when you read instructions, pamphlets, or other written  materials from your doctor or pharmacy?: 1 - Never  Interpreter Needed?: No  Information entered by :: Sally Crazier CMA   Activities of Daily Living     12/02/2023    3:59 PM 01/15/2023    6:33 AM  In your present state of health, do you have any difficulty performing the following activities:  Hearing? 0   Vision? 0   Difficulty concentrating or making decisions? 0   Walking or climbing stairs? 0   Dressing or bathing? 0   Doing errands, shopping? 0 1  Comment patient's sister takes her. she does not Facilities manager and eating ? N   Using the Toilet? N   In the past six months, have you accidently leaked urine? Y   Do you have problems with loss of bowel control? Y   Managing your Medications? N   Managing your Finances? N   Housekeeping or managing your Housekeeping? N     Patient Care Team: Tobi Fortes, MD as PCP - General (Internal Medicine)  Indicate any recent Medical Services you may have received from other than [facility] providers in the past year (date may be approximate).     Assessment:   This is a routine wellness examination for Crystal Snow.  Hearing/Vision screen Hearing Screening - Comments:: Patient denies any hearing difficulties.   Vision Screening - Comments:: Patient wears glasses but states she lost them. She is up to date on yearly exams. She was seeing provider at [facility] Location. Needs referral to establish with a new provider as previous provider left. Referral placed.     Goals Addressed             This Visit's Progress    Patient Stated       I want to cook more       Depression Screen     12/02/2023    4:01 PM 08/26/2023    1:14 PM 11/18/2022    3:25 PM 11/13/2022    2:39 PM  PHQ 2/9 Scores  PHQ - 2 Score 0 0  0 3  PHQ- 9 Score 0   8    Fall Risk     12/02/2023    3:59 PM 08/26/2023    1:14 PM 05/23/2023    2:05 PM 11/18/2022    3:25 PM 11/13/2022    2:39 PM  Fall Risk   Falls in the past year? 0 0 0 1 1  Number  falls in past yr: 0 0 0 0 1  Injury with Fall? 0 0 0 1 1  Comment    Black eye   Risk for fall due to : No Fall Risks;Impaired balance/gait;Impaired mobility No Fall Risks   Impaired mobility  Follow up Falls prevention discussed;Falls evaluation completed;Education provided Falls evaluation completed   Falls evaluation completed    MEDICARE RISK AT HOME:  Medicare Risk at Home Any stairs in or around the home?: Yes If so, are there any without handrails?: No Home free of loose throw rugs in walkways, pet beds, electrical cords, etc?: Yes Adequate lighting in your home to reduce risk of falls?: Yes Life alert?: No Use of a cane, walker or w/c?: Yes Grab bars in the bathroom?: No Shower chair or bench in shower?: No (patient requesting a shower chair. message sent to provider.) Elevated toilet seat or a handicapped toilet?: No  TIMED UP AND GO:  Was the test performed?  No  Cognitive Function: 6CIT completed        12/02/2023    3:56 PM 11/18/2022    3:26 PM  6CIT Screen  What Year? 0 points 0 points  What month? 0 points 0 points  What time? 0 points 0 points  Count back from 20 0 points 0 points  Months in reverse 0 points 2 points  Repeat phrase 0 points 4 points  Total Score 0 points 6 points    Immunizations Immunization History  Administered Date(s) Administered   Influenza Whole 06/09/2006   Influenza, Seasonal, Injecte, Preservative Fre 08/26/2023   Influenza,inj,Quad PF,6+ Mos 07/11/2022   PNEUMOCOCCAL CONJUGATE-20 05/28/2021   Pneumococcal Polysaccharide-23 03/31/2006   Td 02/15/2005   Zoster Recombinant(Shingrix) 10/15/2008    Screening Tests Health Maintenance  Topic Date Due   OPHTHALMOLOGY EXAM  Never done   Zoster Vaccines- Shingrix (2 of 2) 12/10/2008   DTaP/Tdap/Td (2 - Tdap) 02/16/2015   COVID-19 Vaccine (1 - 2024-25 season) Never done   MAMMOGRAM  06/15/2023   DEXA SCAN  Never done   HEMOGLOBIN A1C  11/21/2023   INFLUENZA VACCINE   03/19/2024   Diabetic kidney evaluation - eGFR measurement  05/22/2024   Diabetic kidney evaluation - Urine ACR  05/22/2024   FOOT EXAM  08/25/2024   Medicare Annual Wellness (AWV)  12/01/2024   Colonoscopy  03/08/2031   Pneumonia Vaccine 75+ Years old  Completed   Hepatitis C Screening  Completed   HIV Screening  Completed   HPV VACCINES  Aged Out   Meningococcal B Vaccine  Aged Out    Health Maintenance  Health Maintenance Due  Topic Date Due   OPHTHALMOLOGY EXAM  Never done   Zoster Vaccines- Shingrix (2 of 2) 12/10/2008   DTaP/Tdap/Td (2 - Tdap) 02/16/2015   COVID-19 Vaccine (1 - 2024-25 season) Never done   MAMMOGRAM  06/15/2023   DEXA SCAN  Never done   HEMOGLOBIN A1C  11/21/2023   Health Maintenance Items Addressed: Mammogram ordered, DEXA ordered, Referral sent to Optometry/Ophthalmology, A1C  Additional Screening:  Vision Screening: Recommended annual ophthalmology exams for early detection of  glaucoma and other disorders of the eye.  Dental Screening: Recommended annual dental exams for proper oral hygiene  Community Resource Referral / Chronic Care Management: CRR required this visit?  No   CCM required this visit?  No     Plan:     I have personally reviewed and noted the following in the patient's chart:   Medical and social history Use of alcohol, tobacco or illicit drugs  Current medications and supplements including opioid prescriptions. Patient is not currently taking opioid prescriptions. Functional ability and status Nutritional status Physical activity Advanced directives List of other physicians Hospitalizations, surgeries, and ER visits in previous 12 months Vitals Screenings to include cognitive, depression, and falls Referrals and appointments  In addition, I have reviewed and discussed with patient certain preventive protocols, quality metrics, and best practice recommendations. A written personalized care plan for preventive  services as well as general preventive health recommendations were provided to patient.     Sallye Crease Jahlil Ziller, CMA   12/02/2023   After Visit Summary: (Mail) Due to this being a telephonic visit, the after visit summary with patients personalized plan was offered to patient via mail   Notes: Please refer to Routing Comments.

## 2023-12-02 NOTE — Patient Instructions (Signed)
 [name] , Thank you for taking time to come for your Medicare Wellness Visit. I appreciate your ongoing commitment to your health goals. Please review the following plan we discussed and let me know if I can assist you in the future.   Referrals/Orders/Follow-Ups/Clinician Recommendations:  Next Medicare AWV: December 06, 2024 at 11:20 am telephone visit  Please have the following labs drawn at [facility] at [facility]. You do not have to schedule an appointment for this.  Hemoglobin A1C  You've been referred to [facility]. You can call them to schedule your appointment Address: [address], [place], [place] [address] Phone: [phone]  An order for a mammogram and/or a bone density scan was placed for you today. Please call the number below to schedule your appt.   [facility] at [facility] [address] [place], [place] [address] [phone] Make sure to wear two-piece clothing.  If you're having a mammogram, please remember: No lotions,powders, or deodorants the day of the appointment Make sure to bring picture ID and insurance card.  Bring list of medications you are currently taking including any supplements.  If you are having a bone density scan, please discontinue any medications that contain calcium at least 48 hours (2 days) prior to your scan.        This is a list of the screening recommended for you and due dates:  Health Maintenance  Topic Date Due   Eye exam for diabetics  Never done   Zoster (Shingles) Vaccine (2 of 2) 12/10/2008   DTaP/Tdap/Td vaccine (2 - Tdap) 02/16/2015   COVID-19 Vaccine (1 - 2024-25 season) Never done   Mammogram  06/15/2023   DEXA scan (bone density measurement)  Never done   Hemoglobin A1C  11/21/2023   Flu Shot  03/19/2024   Yearly kidney function blood test for diabetes  05/22/2024   Yearly kidney health urinalysis for diabetes  05/22/2024   Complete foot exam   08/25/2024    Medicare Annual Wellness Visit  12/01/2024   Colon Cancer Screening  03/08/2031   Pneumonia Vaccine  Completed   Hepatitis C Screening  Completed   HIV Screening  Completed   HPV Vaccine  Aged Out   Meningitis B Vaccine  Aged Out    Advanced directives: (Declined) Advance directive discussed with you today. Even though you declined this today, please call our office should you change your mind, and we can give you the proper paperwork for you to fill out. Advance Care Planning is important because it:  [x]  Makes sure you receive the medical care that is consistent with your values, goals, and preferences  [x]  It provides guidance to your family and loved ones and it also reduces their decisional burden about whether or not they are making the right decisions based on what you want done  Follow the link provided in your after visit summary or read over the paperwork we have mailed to you to help you started getting your Advance Directives in place. If you need assistance in completing these, please reach out to [place] so that we can help you!   Next Medicare Annual Wellness Visit scheduled for next year: yes  Understanding Your Risk for Falls Millions of people have serious injuries from falls each year. It is important to understand your risk of falling. Talk with your health care provider about your risk and what you can do to lower it. If you  do have a serious fall, make sure to tell your provider. Falling once raises your risk of falling again. How can falls affect me? Serious injuries from falls are common. These include: Broken bones, such as hip fractures. Head injuries, such as traumatic brain injuries (TBI) or concussions. A fear of falling can cause you to avoid activities and stay at home. This can make your muscles weaker and raise your risk for a fall. What can increase my risk? There are a number of risk factors that increase your risk for falling. The more risk factors you  have, the higher your risk of falling. Serious injuries from a fall happen most often to people who are older than 65 years old. Teenagers and young adults ages 6-29 are also at higher risk. Common risk factors include: Weakness in the lower body. Being generally weak or confused due to long-term (chronic) illness. Dizziness or balance problems. Poor vision. Medicines that cause dizziness or drowsiness. These may include: Medicines for your blood pressure, heart, anxiety, insomnia, or swelling (edema). Pain medicines. Muscle relaxants. Other risk factors include: Drinking alcohol. Having had a fall in the past. Having foot pain or wearing improper footwear. Working at a dangerous job. Having any of the following in your home: Tripping hazards, such as floor clutter or loose rugs. Poor lighting. Pets. Having dementia or memory loss. What actions can I take to lower my risk of falling?     Physical activity Stay physically fit. Do strength and balance exercises. Consider taking a regular class to build strength and balance. Yoga and tai chi are good options. Vision Have your eyes checked every year and your prescription for glasses or contacts updated as needed. Shoes and walking aids Wear non-skid shoes. Wear shoes that have rubber soles and low heels. Do not wear high heels. Do not walk around the house in socks or slippers. Use a cane or walker as told by your provider. Home safety Attach secure railings on both sides of your stairs. Install grab bars for your bathtub, shower, and toilet. Use a non-skid mat in your bathtub or shower. Attach bath mats securely with double-sided, non-slip rug tape. Use good lighting in all rooms. Keep a flashlight near your bed. Make sure there is a clear path from your bed to the bathroom. Use night-lights. Do not use throw rugs. Make sure all carpeting is taped or tacked down securely. Remove all clutter from walkways and stairways,  including extension cords. Repair uneven or broken steps and floors. Avoid walking on icy or slippery surfaces. Walk on the grass instead of on icy or slick sidewalks. Use ice melter to get rid of ice on walkways in the winter. Use a cordless phone. Questions to ask your health care provider Can you help me check my risk for a fall? Do any of my medicines make me more likely to fall? Should I take a vitamin D supplement? What exercises can I do to improve my strength and balance? Should I make an appointment to have my vision checked? Do I need a bone density test to check for weak bones (osteoporosis)? Would it help to use a cane or a walker? Where to find more information Centers for Disease Control and Prevention, STEADI: TonerPromos.no Community-Based Fall Prevention Programs: TonerPromos.no [employer] on Aging: BaseRingTones.pl Contact a health care provider if: You fall at home. You are afraid of falling at home. You feel weak, drowsy, or dizzy. This information is not intended to replace advice given  to you by your health care provider. Make sure you discuss any questions you have with your health care provider. Document Revised: 04/08/2022 Document Reviewed: 04/08/2022 Elsevier Patient Education  2024 [employer].

## 2023-12-08 ENCOUNTER — Other Ambulatory Visit: Payer: Self-pay | Admitting: Internal Medicine

## 2023-12-11 ENCOUNTER — Other Ambulatory Visit: Payer: Self-pay | Admitting: Internal Medicine

## 2024-01-02 DIAGNOSIS — H6021 Malignant otitis externa, right ear: Secondary | ICD-10-CM | POA: Diagnosis not present

## 2024-01-03 DIAGNOSIS — R739 Hyperglycemia, unspecified: Secondary | ICD-10-CM | POA: Diagnosis not present

## 2024-01-03 DIAGNOSIS — Z792 Long term (current) use of antibiotics: Secondary | ICD-10-CM | POA: Diagnosis not present

## 2024-01-03 DIAGNOSIS — E1165 Type 2 diabetes mellitus with hyperglycemia: Secondary | ICD-10-CM | POA: Diagnosis not present

## 2024-01-03 DIAGNOSIS — Z91199 Patient's noncompliance with other medical treatment and regimen due to unspecified reason: Secondary | ICD-10-CM | POA: Diagnosis not present

## 2024-01-16 ENCOUNTER — Encounter: Payer: Self-pay | Admitting: Internal Medicine

## 2024-01-16 ENCOUNTER — Ambulatory Visit (INDEPENDENT_AMBULATORY_CARE_PROVIDER_SITE_OTHER): Admitting: Internal Medicine

## 2024-01-16 VITALS — BP 117/82 | HR 95 | Ht 62.0 in | Wt 213.0 lb

## 2024-01-16 DIAGNOSIS — E559 Vitamin D deficiency, unspecified: Secondary | ICD-10-CM

## 2024-01-16 DIAGNOSIS — E119 Type 2 diabetes mellitus without complications: Secondary | ICD-10-CM | POA: Diagnosis not present

## 2024-01-16 DIAGNOSIS — G629 Polyneuropathy, unspecified: Secondary | ICD-10-CM | POA: Diagnosis not present

## 2024-01-16 DIAGNOSIS — E1159 Type 2 diabetes mellitus with other circulatory complications: Secondary | ICD-10-CM

## 2024-01-16 DIAGNOSIS — I10 Essential (primary) hypertension: Secondary | ICD-10-CM

## 2024-01-16 DIAGNOSIS — N182 Chronic kidney disease, stage 2 (mild): Secondary | ICD-10-CM

## 2024-01-16 MED ORDER — METFORMIN HCL ER 500 MG PO TB24
500.0000 mg | ORAL_TABLET | Freq: Two times a day (BID) | ORAL | 3 refills | Status: DC
Start: 2024-01-16 — End: 2024-03-30

## 2024-01-16 MED ORDER — GABAPENTIN 300 MG PO CAPS: 300.0000 mg | ORAL_CAPSULE | Freq: Two times a day (BID) | ORAL | 2 refills | Status: DC

## 2024-01-16 NOTE — Assessment & Plan Note (Signed)
 Stable on previous labs.  Currently prescribed Jardiance  and lisinopril .  Repeat BMP ordered today.

## 2024-01-16 NOTE — Assessment & Plan Note (Signed)
 Symptoms remain well-controlled with gabapentin  and Cymbalta .  Gabapentin  refilled today.

## 2024-01-16 NOTE — Assessment & Plan Note (Signed)
 Adequately controlled on current antihypertensive regimen.  No medication changes are indicated today.

## 2024-01-16 NOTE — Progress Notes (Signed)
 Established Patient Office Visit  Subjective   Patient ID: Crystal Snow, female    DOB: 1961-05-04  Age: 63 y.o. MRN: [id number]  Chief Complaint  Patient presents with   Diabetes    Three month follow up    Crystal Snow returns to care today for routine follow-up.  She was last evaluated by me on 1/7.  Ozempic  was increased to 0.5 mg weekly at that time.  Glipizide  was discontinued due to potential risk of hypoglycemia.  No additional medication changes were made and 23-month follow-up was arranged.  In the interim, she presented to [facility] ER 5/16 endorsing right ear pain.  Treated with ofloxacin otic drops x 7 days for acute otitis media.  She returned to the emergency department at [facility] 5/17 in the setting of hyperglycemia.  She reported a home blood sugar reading of 400.  POC glucose was 144.  No medication changes were made and PCP follow-up was recommended.  There have otherwise been no acute interval events. Crystal Snow reports feeling well today.  She is asymptomatic and has no acute concerns to discuss.  Past Medical History:  Diagnosis Date   Aortic stenosis    Cardiomyopathy (HCC)    Cholecystitis    lap chole 2006   Chronic kidney disease    CVA (cerebral infarction)    03/2014   Diabetes mellitus without complication (HCC)    Dyslipidemia    Gout    Hypertension    Obesity (BMI 30-39.9)    Obstructive sleep apnea    Stroke [facility])    Past Surgical History:  Procedure Laterality Date   COLONOSCOPY WITH PROPOFOL  N/A 03/07/2021   Procedure: COLONOSCOPY WITH PROPOFOL ;  Surgeon: Ruby Corporal, MD;  Location: [facility] ENDO SUITE;  Service: Endoscopy;  Laterality: N/A;  10:55   TOOTH EXTRACTION N/A 01/15/2023   Procedure: DENTAL RESTORATION/EXTRACTIONS;  Surgeon: Ascencion Lava, DMD;  Location: [facility] OR;  Service: Oral Surgery;  Laterality: N/A;   TOTAL ABDOMINAL HYSTERECTOMY     Social History   Tobacco Use   Smoking status: Never   Smokeless  tobacco: Never  Vaping Use   Vaping status: Never Used  Substance Use Topics   Alcohol use: No    Alcohol/week: 0.0 standard drinks of alcohol   Drug use: No   Family History  Problem Relation Age of Onset   Diabetes Mother    CVA Mother    Hypertension Mother    Cancer Father    Hypertension Sister    Cancer Brother    Cancer Maternal Grandmother    Diabetes Maternal Grandmother    Cancer Maternal Grandfather    Cancer Paternal Grandmother    Cancer Paternal Grandfather    No Known Allergies  Review of Systems  Constitutional:  Negative for chills and fever.  HENT:  Negative for sore throat.   Respiratory:  Negative for cough and shortness of breath.   Cardiovascular:  Negative for chest pain, palpitations and leg swelling.  Gastrointestinal:  Negative for abdominal pain, blood in stool, constipation, diarrhea, nausea and vomiting.  Genitourinary:  Negative for dysuria and hematuria.  Musculoskeletal:  Negative for myalgias.  Skin:  Negative for itching and rash.  Neurological:  Negative for dizziness and headaches.  Psychiatric/Behavioral:  Negative for depression and suicidal ideas.      Objective:     BP 117/82   Pulse 95   Ht 5\' 2"  (1.575 m)   Wt 213 lb (96.6 kg)  SpO2 92%   BMI 38.96 kg/m  BP Readings from Last 3 Encounters:  01/16/24 117/82  08/26/23 136/80  05/23/23 118/78   Physical Exam Vitals reviewed.  Constitutional:      General: She is not in acute distress.    Appearance: Normal appearance. She is obese. She is not toxic-appearing.  HENT:     Head: Normocephalic and atraumatic.     Right Ear: External ear normal.     Left Ear: External ear normal.     Nose: Nose normal. No congestion or rhinorrhea.     Mouth/Throat:     Mouth: Mucous membranes are moist.     Pharynx: Oropharynx is clear. No oropharyngeal exudate or posterior oropharyngeal erythema.  Eyes:     General: No scleral icterus.    Extraocular Movements: Extraocular  movements intact.     Conjunctiva/sclera: Conjunctivae normal.     Pupils: Pupils are equal, round, and reactive to light.  Cardiovascular:     Rate and Rhythm: Normal rate and regular rhythm.     Pulses: Normal pulses.     Heart sounds: Murmur heard.     No friction rub. No gallop.  Pulmonary:     Effort: Pulmonary effort is normal.     Breath sounds: Normal breath sounds. No wheezing, rhonchi or rales.  Abdominal:     General: Abdomen is flat. Bowel sounds are normal. There is no distension.     Palpations: Abdomen is soft.     Tenderness: There is no abdominal tenderness.  Musculoskeletal:        General: No swelling. Normal range of motion.     Cervical back: Normal range of motion.     Right lower leg: No edema.     Left lower leg: No edema.  Lymphadenopathy:     Cervical: No cervical adenopathy.  Skin:    General: Skin is warm and dry.     Capillary Refill: Capillary refill takes less than 2 seconds.     Coloration: Skin is not jaundiced.  Neurological:     General: No focal deficit present.     Mental Status: She is alert and oriented to person, place, and time.     Gait: Gait abnormal (ambulates with a cane).  Psychiatric:        Mood and Affect: Mood normal.        Behavior: Behavior normal.   Last CBC Lab Results  Component Value Date   WBC 5.1 05/23/2023   HGB 13.1 05/23/2023   HCT 41.2 05/23/2023   MCV 87 05/23/2023   MCH 27.5 05/23/2023   RDW 14.6 05/23/2023   PLT 339 05/23/2023   Last metabolic panel Lab Results  Component Value Date   GLUCOSE 138 (H) 05/23/2023   NA 144 05/23/2023   K 3.7 05/23/2023   CL 107 (H) 05/23/2023   CO2 22 05/23/2023   BUN 14 05/23/2023   CREATININE 1.06 (H) 05/23/2023   EGFR 59 (L) 05/23/2023   CALCIUM  9.2 05/23/2023   PROT 7.3 05/23/2023   ALBUMIN 4.3 05/23/2023   LABGLOB 3.0 05/23/2023   BILITOT 0.4 05/23/2023   ALKPHOS 67 05/23/2023   AST 17 05/23/2023   ALT 14 05/23/2023   ANIONGAP 12 01/15/2023   Last  lipids Lab Results  Component Value Date   CHOL 158 05/23/2023   HDL 59 05/23/2023   LDLCALC 84 05/23/2023   TRIG 82 05/23/2023   CHOLHDL 2.7 05/23/2023   Last hemoglobin A1c Lab Results  Component Value Date   HGBA1C 6.4 (H) 05/23/2023   Last thyroid  functions Lab Results  Component Value Date   TSH 1.150 05/23/2023   Last vitamin D  Lab Results  Component Value Date   VD25OH 19.5 (L) 05/23/2023   Last vitamin B12 and Folate Lab Results  Component Value Date   VITAMINB12 337 05/23/2023   FOLATE 11.2 05/23/2023     Assessment & Plan:   Problem List Items Addressed This Visit       Essential hypertension (Chronic)   Adequately controlled on current antihypertensive regimen.  No medication changes are indicated today.      Type 2 diabetes mellitus without complication (HCC) - Primary   A1c 6.4 on labs from October 2024.  Glipizide  was discontinued at her last appointment and Ozempic  was increased to 0.5 mg weekly.  She is additionally prescribed Jardiance  25 mg daily and metformin  XR 500 mg twice daily.  Recent ER presentation in the setting of hyperglycemia.  It appears that this was an isolated event attributed to poor dieting habits.  She checks her blood sugar regularly and reports readings that are consistently well within goal.  Repeat A1c ordered today.      Neuropathy   Symptoms remain well-controlled with gabapentin  and Cymbalta .  Gabapentin  refilled today.      CKD (chronic kidney disease), stage II   Stable on previous labs.  Currently prescribed Jardiance  and lisinopril .  Repeat BMP ordered today.      Vitamin D  deficiency   Noted on previous labs.  Repeat vitamin D  level ordered today.      Return in about 6 months (around 07/18/2024).   Tobi Fortes, MD

## 2024-01-16 NOTE — Assessment & Plan Note (Signed)
 Noted on previous labs.  Repeat vitamin D level ordered today

## 2024-01-16 NOTE — Assessment & Plan Note (Signed)
 A1c 6.4 on labs from October 2024.  Glipizide  was discontinued at her last appointment and Ozempic  was increased to 0.5 mg weekly.  She is additionally prescribed Jardiance  25 mg daily and metformin  XR 500 mg twice daily.  Recent ER presentation in the setting of hyperglycemia.  It appears that this was an isolated event attributed to poor dieting habits.  She checks her blood sugar regularly and reports readings that are consistently well within goal.  Repeat A1c ordered today.

## 2024-01-16 NOTE — Patient Instructions (Signed)
 It was a pleasure to see you today.  Thank you for giving [place] the opportunity to be involved in your care.  Below is a brief recap of your visit and next steps.  We will plan to see you again in 6 months.  Summary No medication changes today Repeat labs ordered Follow up in 6 months

## 2024-01-17 LAB — BASIC METABOLIC PANEL WITH GFR
BUN/Creatinine Ratio: 17 (ref 12–28)
BUN: 19 mg/dL (ref 8–27)
CO2: 19 mmol/L — ABNORMAL LOW (ref 20–29)
Calcium: 10 mg/dL (ref 8.7–10.3)
Chloride: 104 mmol/L (ref 96–106)
Creatinine, Ser: 1.14 mg/dL — ABNORMAL HIGH (ref 0.57–1.00)
Glucose: 124 mg/dL — ABNORMAL HIGH (ref 70–99)
Potassium: 4.7 mmol/L (ref 3.5–5.2)
Sodium: 142 mmol/L (ref 134–144)
eGFR: 53 mL/min/{1.73_m2} — ABNORMAL LOW (ref >59)

## 2024-01-17 LAB — VITAMIN D 25 HYDROXY (VIT D DEFICIENCY, FRACTURES): Vit D, 25-Hydroxy: 26.8 ng/mL — ABNORMAL LOW (ref 30.0–100.0)

## 2024-01-17 LAB — HEMOGLOBIN A1C
Est. average glucose Bld gHb Est-mCnc: 171 mg/dL
Hgb A1c MFr Bld: 7.6 % — ABNORMAL HIGH (ref 4.8–5.6)

## 2024-01-19 ENCOUNTER — Ambulatory Visit: Payer: Self-pay | Admitting: Internal Medicine

## 2024-01-19 ENCOUNTER — Other Ambulatory Visit: Payer: Self-pay | Admitting: Internal Medicine

## 2024-01-19 DIAGNOSIS — E119 Type 2 diabetes mellitus without complications: Secondary | ICD-10-CM

## 2024-01-19 MED ORDER — SEMAGLUTIDE (1 MG/DOSE) 4 MG/3ML ~~LOC~~ SOPN: 1.0000 mg | PEN_INJECTOR | SUBCUTANEOUS | 2 refills | Status: DC

## 2024-01-22 ENCOUNTER — Other Ambulatory Visit: Payer: Self-pay | Admitting: Internal Medicine

## 2024-01-23 ENCOUNTER — Encounter (HOSPITAL_COMMUNITY): Payer: Self-pay

## 2024-01-23 ENCOUNTER — Ambulatory Visit: Attending: Internal Medicine | Admitting: Internal Medicine

## 2024-01-23 ENCOUNTER — Other Ambulatory Visit: Payer: Self-pay

## 2024-01-23 ENCOUNTER — Encounter: Payer: Self-pay | Admitting: Internal Medicine

## 2024-01-23 ENCOUNTER — Emergency Department (HOSPITAL_COMMUNITY)
Admission: EM | Admit: 2024-01-23 | Discharge: 2024-01-23 | Disposition: A | Discharge status: Home / Self Care | Attending: Emergency Medicine | Admitting: Emergency Medicine

## 2024-01-23 VITALS — BP 118/74 | HR 89 | Ht 62.0 in | Wt 218.4 lb

## 2024-01-23 DIAGNOSIS — R600 Localized edema: Secondary | ICD-10-CM | POA: Insufficient documentation

## 2024-01-23 DIAGNOSIS — Z79899 Other long term (current) drug therapy: Secondary | ICD-10-CM | POA: Insufficient documentation

## 2024-01-23 DIAGNOSIS — R55 Syncope and collapse: Secondary | ICD-10-CM | POA: Diagnosis not present

## 2024-01-23 DIAGNOSIS — I350 Nonrheumatic aortic (valve) stenosis: Secondary | ICD-10-CM | POA: Diagnosis not present

## 2024-01-23 DIAGNOSIS — Z7982 Long term (current) use of aspirin: Secondary | ICD-10-CM | POA: Insufficient documentation

## 2024-01-23 DIAGNOSIS — N182 Chronic kidney disease, stage 2 (mild): Secondary | ICD-10-CM | POA: Insufficient documentation

## 2024-01-23 DIAGNOSIS — E1122 Type 2 diabetes mellitus with diabetic chronic kidney disease: Secondary | ICD-10-CM | POA: Diagnosis not present

## 2024-01-23 DIAGNOSIS — I130 Hypertensive heart and chronic kidney disease with heart failure and stage 1 through stage 4 chronic kidney disease, or unspecified chronic kidney disease: Secondary | ICD-10-CM | POA: Insufficient documentation

## 2024-01-23 DIAGNOSIS — Z7984 Long term (current) use of oral hypoglycemic drugs: Secondary | ICD-10-CM | POA: Insufficient documentation

## 2024-01-23 DIAGNOSIS — I509 Heart failure, unspecified: Secondary | ICD-10-CM | POA: Insufficient documentation

## 2024-01-23 DIAGNOSIS — R2243 Localized swelling, mass and lump, lower limb, bilateral: Secondary | ICD-10-CM | POA: Diagnosis present

## 2024-01-23 LAB — CBC
HCT: 39.1 % (ref 36.0–46.0)
Hemoglobin: 12.8 g/dL (ref 12.0–15.0)
MCH: 28.1 pg (ref 26.0–34.0)
MCHC: 32.7 g/dL (ref 30.0–36.0)
MCV: 85.7 fL (ref 80.0–100.0)
Platelets: 275 10*3/uL (ref 150–400)
RBC: 4.56 MIL/uL (ref 3.87–5.11)
RDW: 15.4 % (ref 11.5–15.5)
WBC: 7.2 10*3/uL (ref 4.0–10.5)
nRBC: 0 % (ref 0.0–0.2)

## 2024-01-23 LAB — BASIC METABOLIC PANEL WITH GFR
Anion gap: 5 (ref 5–15)
BUN: 15 mg/dL (ref 8–23)
CO2: 25 mmol/L (ref 22–32)
Calcium: 9.1 mg/dL (ref 8.9–10.3)
Chloride: 110 mmol/L (ref 98–111)
Creatinine, Ser: 0.94 mg/dL (ref 0.44–1.00)
GFR, Estimated: >60 mL/min (ref >60)
Glucose, Bld: 118 mg/dL — ABNORMAL HIGH (ref 70–99)
Potassium: 3.6 mmol/L (ref 3.5–5.1)
Sodium: 140 mmol/L (ref 135–145)

## 2024-01-23 MED ORDER — FUROSEMIDE 20 MG PO TABS: 20.0000 mg | ORAL_TABLET | Freq: Every day | ORAL | 0 refills | Status: AC | PRN

## 2024-01-23 MED ORDER — ADULT BLOOD PRESSURE CUFF LG KIT: 1.0000 [IU] | PACK | Freq: Once | 0 refills | Status: AC

## 2024-01-23 NOTE — Patient Instructions (Signed)
 Medication Instructions:  Your physician recommends that you continue on your current medications as directed. Please refer to the Current Medication list given to you today.  *If you need a refill on your cardiac medications before your next appointment, please call your pharmacy*  Lab Work: NONE   If you have labs (blood work) drawn today and your tests are completely normal, you will receive your results only by: MyChart Message (if you have MyChart) OR A paper copy in the mail If you have any lab test that is abnormal or we need to change your treatment, we will call you to review the results.  Testing/Procedures: NONE   Follow-Up: At [facility], you and your health needs are our priority.  As part of our continuing mission to provide you with exceptional heart care, our providers are all part of one team.  This team includes your primary Cardiologist (physician) and Advanced Practice Providers or APPs (Physician Assistants and Nurse Practitioners) who all work together to provide you with the care you need, when you need it.  Your next appointment:   1 year(s)  Provider:   You may see Vishnu P Mallipeddi, MD or one of the following Advanced Practice Providers on your designated Care Team:   [place], PA-C  [place], [place] Theotis Flake, [place]     We recommend signing up for the patient portal called "MyChart".  Sign up information is provided on this After Visit Summary.  MyChart is used to connect with patients for Virtual Visits (Telemedicine).  Patients are able to view lab/test results, encounter notes, upcoming appointments, etc.  Non-urgent messages can be sent to your provider as well.   To learn more about what you can do with MyChart, go to [email or web address].   Other Instructions Thank you for choosing [facility]!

## 2024-01-23 NOTE — Progress Notes (Signed)
 Cardiology Office Note  Date: 01/23/2024   ID: Crystal Snow, DOB [birth year], MRN [id number]  PCP:  Alison Irvine, FNP  Cardiologist:  Lasalle Pointer, MD Electrophysiologist:  None   Reason for Office Visit: F/u HFimpEF, syncope   History of Present Illness: Crystal Snow is a 63 y.o. 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  No recurrent syncopal events or dizziness in the last 1 year.  No angina or  DOE.  No leg swelling.  Compliant with medications.  Past Medical History:  Diagnosis Date   Aortic stenosis    Cardiomyopathy (HCC)    Cholecystitis    lap chole 2006   Chronic kidney disease    CVA (cerebral infarction)    03/2014   Diabetes mellitus without complication (HCC)    Dyslipidemia    Gout    Hypertension    Obesity (BMI 30-39.9)    Obstructive sleep apnea    Stroke [facility])     Past Surgical History:  Procedure Laterality Date   COLONOSCOPY WITH PROPOFOL  N/A 03/07/2021   Procedure: COLONOSCOPY WITH PROPOFOL ;  Surgeon: Ruby Corporal, MD;  Location: [facility] ENDO SUITE;  Service: Endoscopy;  Laterality: N/A;  10:55   TOOTH EXTRACTION N/A 01/15/2023   Procedure: DENTAL RESTORATION/EXTRACTIONS;  Surgeon: Ascencion Lava, DMD;  Location: [facility] OR;  Service: Oral Surgery;  Laterality: N/A;   TOTAL ABDOMINAL HYSTERECTOMY      Current Outpatient Medications  Medication Sig Dispense Refill   acetaminophen  (TYLENOL ) 500 MG tablet Take 500-1,000 mg by mouth every 6 (six) hours as needed (pain.).     amLODipine  (NORVASC ) 5 MG tablet Take 1 tablet (5 mg total) by mouth in the morning. 90 tablet 3   aspirin  EC 81 MG tablet Take 81 mg by mouth in the morning.     atorvastatin  (LIPITOR) 80 MG tablet Take 1 tablet (80 mg total) by mouth in the morning. 90 tablet 3   Blood  Pressure KIT 1 kit by Does not apply route 2 (two) times daily. 1 kit 0   Blood Pressure Monitoring (ADULT BLOOD PRESSURE CUFF LG) KIT 1 Units by Does not apply route once for 1 dose. 1 kit 0   carvedilol  (COREG ) 25 MG tablet Take 1 tablet (25 mg total) by mouth 2 (two) times daily. 180 tablet 1   Cholecalciferol (VITAMIN D3) 50 MCG (2000 UT) TABS Take 2,000 Units by mouth in the morning.     DULoxetine  (CYMBALTA ) 30 MG capsule Take 1 capsule (30 mg total) by mouth in the morning. 30 capsule 1   empagliflozin  (JARDIANCE ) 25 MG TABS tablet Take 1 tablet (25 mg total) by mouth daily. 100 tablet 3   Fiber Adult Gummies 2 g CHEW Chew 2 g by mouth daily.     gabapentin  (NEURONTIN ) 300 MG capsule Take 1 capsule (300 mg total) by mouth 2 (two) times daily. 60 capsule 2   lisinopril  (ZESTRIL ) 40 MG tablet Take 1 tablet (40 mg total) by mouth in the morning. 90 tablet 3   metFORMIN  (GLUCOPHAGE -XR) 500 MG 24 hr tablet Take 1 tablet (500 mg total) by mouth 2 (two) times daily with a meal. 180 tablet 3   Semaglutide , 1 MG/DOSE, 4 MG/3ML SOPN Inject 1 mg as directed once a week. 3  mL 2   vitamin B-12 (CYANOCOBALAMIN ) 1000 MCG tablet Take 1,000 mcg by mouth in the morning.     vitamin C (ASCORBIC ACID) 250 MG tablet Take 250 mg by mouth in the morning.     No current facility-administered medications for this visit.   Allergies:  Patient has no known allergies.   Social History: The patient  reports that she has never smoked. She has never used smokeless tobacco. She reports that she does not drink alcohol and does not use drugs.   Family History: The patient's family history includes CVA in her mother; Cancer in her brother, father, maternal grandfather, maternal grandmother, paternal grandfather, and paternal grandmother; Diabetes in her maternal grandmother and mother; Hypertension in her mother and sister.   ROS:  Please see the history of present illness. Otherwise, complete review of systems is  positive for none.  All other systems are reviewed and negative.   Physical Exam: VS:  BP 118/74   Pulse 89   Ht 5\' 2"  (1.575 m)   Wt 218 lb 6.4 oz (99.1 kg)   SpO2 95%   BMI 39.95 kg/m , BMI Body mass index is 39.95 kg/m.  Wt Readings from Last 3 Encounters:  01/23/24 218 lb 6.4 oz (99.1 kg)  01/16/24 213 lb (96.6 kg)  12/02/23 215 lb (97.5 kg)    General: Patient appears comfortable at rest. HEENT: Conjunctiva and lids normal, oropharynx clear with moist mucosa. Neck: Supple, no elevated JVP or carotid bruits, no thyromegaly. Lungs: Clear to auscultation, nonlabored breathing at rest. Cardiac: Regular rate and rhythm, no S3 or significant systolic murmur, no pericardial rub. Abdomen: Soft, nontender, no hepatomegaly, bowel sounds present, no guarding or rebound. Extremities: No pitting edema, distal pulses 2+. Skin: Warm and dry. Musculoskeletal: No kyphosis. Neuropsychiatric: Alert and oriented x3, affect grossly appropriate.  Recent Labwork: 05/23/2023: ALT 14; AST 17; Hemoglobin 13.1; Platelets 339; TSH 1.150 01/16/2024: BUN 19; Creatinine, Ser 1.14; Potassium 4.7; Sodium 142     Component Value Date/Time   CHOL 158 05/23/2023 1433   TRIG 82 05/23/2023 1433   HDL 59 05/23/2023 1433   CHOLHDL 2.7 05/23/2023 1433   CHOLHDL 3.4 01/24/2015 0523   VLDL 29 01/24/2015 0523   LDLCALC 84 05/23/2023 1433    Assessment and Plan:  Syncope, likely vasovagal: 1 episode of dizziness followed by unwitnessed syncope while getting up from commode in 2024 prompting cardiology referral.  No recurrent syncopal events or lightheadedness in the last 1 year.  Echocardiogram 2022 showed normal LVEF, G1 DD, normal RV function, mild aortic valve stenosis with aortic valve V-max 1.96 m/s and mean AV gradient 7 mmHg.  Repeat echocardiogram is scheduled for January 29, 2024.  Mild aortic valve stenosis: Asymptomatic. Echocardiogram 2022 showed normal LVEF, G1 DD, normal RV function, mild aortic valve  stenosis with aortic valve V-max 1.96 m/s and mean AV gradient 7 mmHg.  Repeat echocardiogram is scheduled for January 29, 2024.  HFimpEF (40 to 45% in 2015 that improved by 2016): Compensated.  Continue carvedilol  25 mg twice daily, lisinopril  40 mg once daily.  History of TIAs: Continue aspirin  81 mg once daily, atorvastatin  80 mg nightly.  HLD, at goal: Continue atorvastatin  80 mg nightly, goal LDL less than 70.  HTN, controlled: Continue current antihypertensives, amlodipine  5 mg once daily, carvedilol  25 mg twice daily, lisinopril  40 mg once daily.  Does not check blood pressures at home as she does not have a blood pressure machine.  Will prescribe blood  pressure machine today.    Medication Adjustments/Labs and Tests Ordered: Current medicines are reviewed at length with the patient today.  Concerns regarding medicines are outlined above.   Tests Ordered: Orders Placed This Encounter  Procedures   EKG 12-Lead    Medication Changes: Meds ordered this encounter  Medications   Blood Pressure Monitoring (ADULT BLOOD PRESSURE CUFF LG) KIT    Sig: 1 Units by Does not apply route once for 1 dose.    Dispense:  1 kit    Refill:  0    Disposition:  Follow up one year  Signed, Joannie Medine Beauford Bounds, MD, 01/23/2024 9:33 AM    [facility] at [facility] [address], [place], [place] [address]

## 2024-01-23 NOTE — Discharge Instructions (Addendum)
 Your exam and your lab test today are reassuring.  I do suspect your ankle edema may be due to the [place] food you ate yesterday, particularly the heavy salt content of this food probably is the reason for your ankle edema today.  Your kidney function looks fine.  I have prescribed you a small quantity of Lasix  which is a fluid pill.  You may take 1 tablet daily, but just on days you have increased swelling in your ankles.  Plan follow-up with your primary doctor if your symptoms persist or worsen.  Try to avoid consumption of salty foods.

## 2024-01-23 NOTE — ED Triage Notes (Signed)
 Pt reports she noticed both feet were swollen while she was watching tv today.  Pt denies any other symptoms.

## 2024-01-23 NOTE — ED Notes (Signed)
 Pt lying in bed with feet elevated. A&Ox4 and ambulatory with cane. Pt denies dizziness, headaches, or pain at this time. Stated, "my legs are really swollen and they aren't normally like this."   Bilateral non pitting edema seen in ankles. This nurse does not see swelling in either legs.

## 2024-01-26 NOTE — ED Provider Notes (Signed)
 [place] EMERGENCY DEPARTMENT AT [facility] Provider Note   CSN: [id number] Arrival date & time: 01/23/24  1902     History  Chief Complaint  Patient presents with   Foot Swelling    Crystal Snow is a 63 y.o. female with a history most significant for  type 2 diabetes, CHF, hypertension and chronic kidney disease stage II presenting for evaluation of bilateral swollen feet which she noticed while watching TV today.  She denies any pain in her legs or feet.  She also denies any increased shortness of breath, chest pain, orthopnea, cough or wheezing.  She denies fevers or chills.  She does take multiple medications for treatment of her hypertension but is not on any diuretics.  She does endorse eating out at a [employer] yesterday so endorses probably consumed more salt than she should have.  The history is provided by the patient.       Home Medications Prior to Admission medications   Medication Sig Start Date End Date Taking? Authorizing Provider  furosemide  (LASIX ) 20 MG tablet Take 1 tablet (20 mg total) by mouth daily as needed. 01/23/24  Yes Tyrek Lawhorn, PA-C  acetaminophen  (TYLENOL ) 500 MG tablet Take 500-1,000 mg by mouth every 6 (six) hours as needed (pain.).    [provider]  amLODipine  (NORVASC ) 5 MG tablet Take 1 tablet (5 mg total) by mouth in the morning. 08/26/23   Tobi Fortes, MD  aspirin  EC 81 MG tablet Take 81 mg by mouth in the morning.    [provider]  atorvastatin  (LIPITOR) 80 MG tablet Take 1 tablet (80 mg total) by mouth in the morning. 08/26/23   Tobi Fortes, MD  Blood Pressure KIT 1 kit by Does not apply route 2 (two) times daily. 01/29/23   Mallipeddi, Vishnu P, MD  carvedilol  (COREG ) 25 MG tablet Take 1 tablet (25 mg total) by mouth 2 (two) times daily. 08/26/23   Dixon, Phillip E, MD  Cholecalciferol (VITAMIN D3) 50 MCG (2000 UT) TABS Take 2,000 Units by mouth in the morning.    [provider]   DULoxetine  (CYMBALTA ) 30 MG capsule Take 1 capsule (30 mg total) by mouth in the morning. 12/08/23   Tobi Fortes, MD  empagliflozin  (JARDIANCE ) 25 MG TABS tablet Take 1 tablet (25 mg total) by mouth daily. 01/22/24   Alison Irvine, FNP  Fiber Adult Gummies 2 g CHEW Chew 2 g by mouth daily.    [provider]  gabapentin  (NEURONTIN ) 300 MG capsule Take 1 capsule (300 mg total) by mouth 2 (two) times daily. 01/16/24   Tobi Fortes, MD  lisinopril  (ZESTRIL ) 40 MG tablet Take 1 tablet (40 mg total) by mouth in the morning. 08/26/23   Tobi Fortes, MD  metFORMIN  (GLUCOPHAGE -XR) 500 MG 24 hr tablet Take 1 tablet (500 mg total) by mouth 2 (two) times daily with a meal. 01/16/24   Tobi Fortes, MD  Semaglutide , 1 MG/DOSE, 4 MG/3ML SOPN Inject 1 mg as directed once a week. 01/19/24   Tobi Fortes, MD  vitamin B-12 (CYANOCOBALAMIN ) 1000 MCG tablet Take 1,000 mcg by mouth in the morning.    [provider]  vitamin C (ASCORBIC ACID) 250 MG tablet Take 250 mg by mouth in the morning.    [provider]      Allergies    Patient has no known allergies.    Review of Systems   Review of Systems  Constitutional:  Negative for fever.  HENT:  Negative for congestion and sore throat.   Eyes: Negative.   Respiratory:  Negative for cough, chest tightness and shortness of breath.   Cardiovascular:  Positive for leg swelling. Negative for chest pain.  Gastrointestinal:  Negative for abdominal pain, nausea and vomiting.  Genitourinary: Negative.   Musculoskeletal:  Negative for arthralgias, joint swelling and neck pain.  Skin: Negative.  Negative for rash and wound.  Neurological:  Negative for dizziness, weakness, light-headedness, numbness and headaches.  Psychiatric/Behavioral: Negative.      Physical Exam Updated Vital Signs BP (!) 142/90 (BP Location: Right Arm)   Pulse 90   Temp 98 F (36.7 C)   Resp 18   Ht 5\' 2"  (1.575 m)   Wt 97.5 kg   SpO2 90%   BMI  39.32 kg/m  Physical Exam Vitals and nursing note reviewed.  Constitutional:      Appearance: She is well-developed.  HENT:     Head: Normocephalic and atraumatic.  Eyes:     Conjunctiva/sclera: Conjunctivae normal.  Cardiovascular:     Rate and Rhythm: Normal rate and regular rhythm.     Heart sounds: Normal heart sounds.  Pulmonary:     Effort: Pulmonary effort is normal.     Breath sounds: Normal breath sounds. No wheezing, rhonchi or rales.  Abdominal:     General: Bowel sounds are normal.     Palpations: Abdomen is soft.     Tenderness: There is no abdominal tenderness.  Musculoskeletal:        General: Swelling present. Normal range of motion.     Cervical back: Normal range of motion.     Right lower leg: No swelling or tenderness.     Left lower leg: No swelling or tenderness.     Right ankle: Swelling present. No tenderness.     Left ankle: Swelling present. No tenderness.     Right foot: Normal capillary refill. Swelling present. Normal pulse.     Left foot: Normal capillary refill. Swelling present. Normal pulse.  Skin:    General: Skin is warm and dry.  Neurological:     Mental Status: She is alert.     ED Results / Procedures / Treatments   Labs (all labs ordered are listed, but only abnormal results are displayed) Labs Reviewed  BASIC METABOLIC PANEL WITH GFR - Abnormal; Notable for the following components:      Result Value   Glucose, Bld 118 (*)    All other components within normal limits  CBC    EKG None  Radiology No results found.  Procedures Procedures    Medications Ordered in ED Medications - No data to display  ED Course/ Medical Decision Making/ A&P                                 Medical Decision Making Patient presenting with increased swelling in her feet and ankles bilaterally, she has no pain, she has palpable pedal pulses, no wounds, no erythema.  She has no calf tenderness or edema noted above the ankle level.  I  suspect fluid retention secondary to consuming excess salt yesterday.  We discussed elevating her legs to help reduce swelling.  She was also given a prescription for as needed Lasix .  Advise close follow-up with her primary provider.  Symptoms are bilateral, and is not associated with pain, DVT unlikely.  Skin is  intact without evidence for cellulitis, no wounds.  Amount and/or Complexity of Data Reviewed Labs: ordered.    Details: Remittent CBC reviewed, no acute findings.  Risk Prescription drug management.           Final Clinical Impression(s) / ED Diagnoses Final diagnoses:  Peripheral edema    Rx / DC Orders ED Discharge Orders          Ordered    furosemide  (LASIX ) 20 MG tablet  Daily PRN        01/23/24 2159              Kaysi Ourada, PA-C 01/26/24 1555

## 2024-01-29 ENCOUNTER — Ambulatory Visit (HOSPITAL_COMMUNITY)
Admission: RE | Admit: 2024-01-29 | Discharge: 2024-01-29 | Disposition: A | Discharge status: Home / Self Care | Payer: 59 | Source: Ambulatory Visit | Attending: Internal Medicine | Admitting: Internal Medicine

## 2024-01-29 DIAGNOSIS — I350 Nonrheumatic aortic (valve) stenosis: Secondary | ICD-10-CM | POA: Diagnosis not present

## 2024-01-29 LAB — ECHOCARDIOGRAM COMPLETE
AR max vel: 1.75 cm2
AV Area VTI: 1.84 cm2
AV Area mean vel: 1.69 cm2
AV Mean grad: 7.7 mmHg
AV Peak grad: 13.6 mmHg
Ao pk vel: 1.84 m/s
Area-P 1/2: 4.8 cm2
S' Lateral: 2.8 cm

## 2024-01-29 NOTE — Progress Notes (Signed)
*  PRELIMINARY RESULTS* Echocardiogram 2D Echocardiogram has been performed.  Glenna Lango 01/29/2024, 10:03 AM

## 2024-01-30 ENCOUNTER — Other Ambulatory Visit: Payer: Self-pay | Admitting: Internal Medicine

## 2024-01-30 ENCOUNTER — Other Ambulatory Visit (HOSPITAL_COMMUNITY): Payer: 59

## 2024-01-30 ENCOUNTER — Ambulatory Visit: Payer: Self-pay | Admitting: Internal Medicine

## 2024-02-02 NOTE — Telephone Encounter (Signed)
 Pt is returning call to nurse for results

## 2024-02-12 ENCOUNTER — Encounter (HOSPITAL_COMMUNITY): Payer: Self-pay

## 2024-02-12 ENCOUNTER — Ambulatory Visit (HOSPITAL_COMMUNITY)
Admission: RE | Admit: 2024-02-12 | Discharge: 2024-02-12 | Disposition: A | Discharge status: Home / Self Care | Source: Ambulatory Visit | Attending: Internal Medicine | Admitting: Internal Medicine

## 2024-02-12 DIAGNOSIS — Z780 Asymptomatic menopausal state: Secondary | ICD-10-CM | POA: Insufficient documentation

## 2024-02-12 DIAGNOSIS — Z1231 Encounter for screening mammogram for malignant neoplasm of breast: Secondary | ICD-10-CM | POA: Diagnosis not present

## 2024-02-13 ENCOUNTER — Ambulatory Visit: Payer: Self-pay | Admitting: Internal Medicine

## 2024-02-24 DIAGNOSIS — H16223 Keratoconjunctivitis sicca, not specified as Sjogren's, bilateral: Secondary | ICD-10-CM | POA: Diagnosis not present

## 2024-02-24 DIAGNOSIS — Z961 Presence of intraocular lens: Secondary | ICD-10-CM | POA: Diagnosis not present

## 2024-02-24 DIAGNOSIS — H5203 Hypermetropia, bilateral: Secondary | ICD-10-CM | POA: Diagnosis not present

## 2024-02-24 DIAGNOSIS — E113293 Type 2 diabetes mellitus with mild nonproliferative diabetic retinopathy without macular edema, bilateral: Secondary | ICD-10-CM | POA: Diagnosis not present

## 2024-03-05 ENCOUNTER — Other Ambulatory Visit: Payer: Self-pay | Admitting: Internal Medicine

## 2024-03-15 ENCOUNTER — Ambulatory Visit: Payer: Self-pay

## 2024-03-15 ENCOUNTER — Telehealth: Payer: Self-pay

## 2024-03-15 MED ORDER — CARVEDILOL 25 MG PO TABS: 25.0000 mg | ORAL_TABLET | Freq: Two times a day (BID) | ORAL | 1 refills | Status: DC

## 2024-03-15 MED ORDER — GABAPENTIN 300 MG PO CAPS: 300.0000 mg | ORAL_CAPSULE | Freq: Two times a day (BID) | ORAL | 1 refills | Status: DC

## 2024-03-15 NOTE — Telephone Encounter (Signed)
 FYI Only or Action Required?: FYI only for provider.  Patient was last seen in primary care on 01/16/2024 by Melvenia Manus BRAVO, MD.  Called Nurse Triage reporting Generalized Body Aches.  Symptoms began yesterday.  Interventions attempted: Other: elevation.  Symptoms are: unchanged.  Triage Disposition: See Physician Within 24 Hours  Patient/caregiver understands and will follow disposition?: Yes    Copied from CRM #[id number]. Topic: Clinical - Red Word Triage >> Mar 15, 2024  2:19 PM Sasha H wrote: Red Word that prompted transfer to Nurse Triage: Pt states she fell and her whole body is sore. States she fell yesterday Reason for Disposition  [1] Swollen foot AND [2] no fever  (Exceptions: Localized bump from bunions, calluses, insect bite, sting.)  Answer Assessment - Initial Assessment Questions 1. ONSET: When did the pain start?      yesterday 2. LOCATION: Where is the pain located?      Left foot 3. PAIN: How bad is the pain?    (Scale 1-10; or mild, moderate, severe)     5/10 4. WORK OR EXERCISE: Has there been any recent work or exercise that involved this part of the body?      no 5. CAUSE: What do you think is causing the foot pain?     Foot gave out and she fell  6. OTHER SYMPTOMS: Do you have any other symptoms? (e.g., leg pain, rash, fever, numbness)     Hot flashes, swelling  Protocols used: Foot Pain-A-AH

## 2024-03-17 ENCOUNTER — Ambulatory Visit

## 2024-03-17 NOTE — Telephone Encounter (Signed)
 meds

## 2024-03-24 ENCOUNTER — Ambulatory Visit: Payer: Self-pay

## 2024-03-24 NOTE — Telephone Encounter (Signed)
 FYI Only or Action Required?: Action required by provider: update on patient condition.  Patient was last seen in primary care on 01/16/2024 by Melvenia Manus BRAVO, MD.  Called Nurse Triage reporting Fall.  Symptoms began a week ago.  Interventions attempted: Nothing.  Symptoms are: unchanged.  Triage Disposition: See HCP Within 4 Hours (Or PCP Triage)  Patient/caregiver understands and will follow disposition?: NoCopied from CRM #[id number]. Topic: Clinical - Red Word Triage >> Mar 24, 2024  1:41 PM Crystal Snow wrote: Patient stated she fell and hurt her knee. Her feet is also swollen/ She does not want to go to urgent care Reason for Disposition  [1] MODERATE weakness (e.g., interferes with work, school, normal activities) AND [2] new-onset or getting worse  Answer Assessment - Initial Assessment Questions Pt called back regarding same concerns for leg leg/foot pain from fall last week. No appts until 8/22. RN advised pt to seek care at [facility] but pt refused. Pt wants office to call and schedule soonest appt. Please advise       1. MECHANISM: How did the fall happen?    Leg gave out 2. DOMESTIC VIOLENCE AND ELDER ABUSE SCREENING: Did you fall because someone pushed you or tried to hurt you? If Yes, ask: Are you safe now?     na 3. ONSET: When did the fall happen? (e.g., minutes, hours, or days ago)     Last week  4. LOCATION: What part of the body hit the ground? (e.g., back, buttocks, head, hips, knees, hands, head, stomach)     Whole body 5. INJURY: Did you hurt (injure) yourself when you fell? If Yes, ask: What did you injure? Tell me more about this? (e.g., body area; type of injury; pain severity)     left 6. PAIN: Is there any pain? If Yes, ask: How bad is the pain? (e.g., Scale 0-10; or none, mild,      5 7. SIZE: For cuts, bruises, or swelling, ask: How large is it? (e.g., inches or centimeters)      Left foot swollen   9. OTHER SYMPTOMS: Do you have any  other symptoms? (e.g., dizziness, fever, weakness; new-onset or worsening).      Dizzy at times 10. CAUSE: What do you think caused the fall (or falling)? (e.g., dizzy spell, tripped)       Previous stroke and leg is weak  Protocols used: Falls and [facility]

## 2024-03-25 NOTE — Telephone Encounter (Signed)
 Pt scheduled again. LVM to inform.

## 2024-03-30 ENCOUNTER — Ambulatory Visit (INDEPENDENT_AMBULATORY_CARE_PROVIDER_SITE_OTHER)

## 2024-03-30 ENCOUNTER — Ambulatory Visit (HOSPITAL_COMMUNITY)
Admission: RE | Admit: 2024-03-30 | Discharge: 2024-03-30 | Disposition: A | Discharge status: Home / Self Care | Source: Ambulatory Visit

## 2024-03-30 VITALS — BP 135/83 | HR 86 | Ht 62.0 in | Wt 224.0 lb

## 2024-03-30 DIAGNOSIS — M7660 Achilles tendinitis, unspecified leg: Secondary | ICD-10-CM | POA: Diagnosis not present

## 2024-03-30 DIAGNOSIS — E1169 Type 2 diabetes mellitus with other specified complication: Secondary | ICD-10-CM

## 2024-03-30 DIAGNOSIS — G629 Polyneuropathy, unspecified: Secondary | ICD-10-CM | POA: Diagnosis not present

## 2024-03-30 DIAGNOSIS — M79672 Pain in left foot: Secondary | ICD-10-CM

## 2024-03-30 DIAGNOSIS — E119 Type 2 diabetes mellitus without complications: Secondary | ICD-10-CM

## 2024-03-30 DIAGNOSIS — I10 Essential (primary) hypertension: Secondary | ICD-10-CM | POA: Diagnosis not present

## 2024-03-30 DIAGNOSIS — E785 Hyperlipidemia, unspecified: Secondary | ICD-10-CM | POA: Diagnosis not present

## 2024-03-30 DIAGNOSIS — Z7985 Long-term (current) use of injectable non-insulin antidiabetic drugs: Secondary | ICD-10-CM | POA: Diagnosis not present

## 2024-03-30 DIAGNOSIS — I5032 Chronic diastolic (congestive) heart failure: Secondary | ICD-10-CM | POA: Diagnosis not present

## 2024-03-30 DIAGNOSIS — Z7984 Long term (current) use of oral hypoglycemic drugs: Secondary | ICD-10-CM

## 2024-03-30 DIAGNOSIS — M79673 Pain in unspecified foot: Secondary | ICD-10-CM | POA: Diagnosis not present

## 2024-03-30 DIAGNOSIS — M19079 Primary osteoarthritis, unspecified ankle and foot: Secondary | ICD-10-CM | POA: Diagnosis not present

## 2024-03-30 DIAGNOSIS — E66813 Obesity, class 3: Secondary | ICD-10-CM

## 2024-03-30 MED ORDER — DULOXETINE HCL 30 MG PO CPEP: 30.0000 mg | ORAL_CAPSULE | Freq: Every morning | ORAL | 3 refills | Status: AC

## 2024-03-30 MED ORDER — ATORVASTATIN CALCIUM 80 MG PO TABS: 80.0000 mg | ORAL_TABLET | Freq: Every morning | ORAL | 3 refills | Status: AC

## 2024-03-30 MED ORDER — SEMAGLUTIDE (2 MG/DOSE) 8 MG/3ML ~~LOC~~ SOPN: 2.0000 mg | PEN_INJECTOR | SUBCUTANEOUS | 5 refills | Status: AC

## 2024-03-30 MED ORDER — METFORMIN HCL ER 500 MG PO TB24: 500.0000 mg | ORAL_TABLET | Freq: Two times a day (BID) | ORAL | 3 refills | Status: AC

## 2024-03-30 MED ORDER — LISINOPRIL 40 MG PO TABS: 40.0000 mg | ORAL_TABLET | Freq: Every morning | ORAL | 3 refills | Status: AC

## 2024-03-30 MED ORDER — AMLODIPINE BESYLATE 5 MG PO TABS
5.0000 mg | ORAL_TABLET | Freq: Every morning | ORAL | 3 refills | Status: AC
Start: 2024-03-30 — End: ?

## 2024-03-30 NOTE — Patient Instructions (Signed)
 Obtain x-ray of left foot at [facility].  We will call with results in a few days.  Continue to elevate that foot.  You can also use an Ace bandage as a wrap for extra support.

## 2024-03-30 NOTE — Progress Notes (Signed)
 Established Patient Office Visit  Subjective   Patient ID: Crystal Snow, female    DOB: 1961-07-27  Age: 63 y.o. MRN: [id number]  Chief Complaint  Patient presents with   Crystal Snow about a week or two ago, patient now has swelling in the left foot    Fall  Patient reports that her left leg just gave out the other day and she fell twisting her left ankle.  She has weakness in this leg from previous strokes.  Patient Active Problem List   Diagnosis Date Noted   Vasovagal syncope 01/23/2024   Vitamin D  deficiency 01/16/2024   Loose stools 05/23/2023   Heart failure with improved ejection fraction (HFimpEF) (HCC) 01/29/2023   Hyperlipidemia associated with type 2 diabetes mellitus (HCC) 11/19/2022   CKD (chronic kidney disease), stage II 11/19/2022   Neuropathy 11/19/2022   Mild aortic stenosis 11/19/2022   Left-sided weakness 01/25/2015   TIA (transient ischemic attack) 01/23/2015   Cardiomyopathy (HCC) 04/13/2014   History of CVA (cerebrovascular accident) 03/24/2014   Hypokalemia 03/24/2014   Obesity, Class III, BMI 40-49.9 (morbid obesity) 03/24/2014   Type 2 diabetes mellitus without complication (HCC) 11/30/2007   Essential hypertension 11/30/2007    ROS    Objective:     BP 135/83   Pulse 86   Ht 5' 2 (1.575 m)   Wt 224 lb (101.6 kg)   SpO2 93%   BMI 40.97 kg/m  BP Readings from Last 3 Encounters:  03/30/24 135/83  01/23/24 (!) 142/90  01/23/24 118/74   Wt Readings from Last 3 Encounters:  03/30/24 224 lb (101.6 kg)  01/23/24 215 lb (97.5 kg)  01/23/24 218 lb 6.4 oz (99.1 kg)      Physical Exam Vitals and nursing note reviewed.  Constitutional:      Appearance: Normal appearance. She is obese.  HENT:     Head: Normocephalic.  Eyes:     Extraocular Movements: Extraocular movements intact.     Pupils: Pupils are equal, round, and reactive to light.  Cardiovascular:     Rate and Rhythm: Normal rate and regular rhythm.  Pulmonary:      Effort: Pulmonary effort is normal.     Breath sounds: Normal breath sounds.  Musculoskeletal:     Cervical back: Normal range of motion and neck supple.  Neurological:     Mental Status: She is alert and oriented to person, place, and time.  Psychiatric:        Mood and Affect: Mood normal.        Thought Content: Thought content normal.      No results found for any visits on 03/30/24.  Last CBC Lab Results  Component Value Date   WBC 7.2 01/23/2024   HGB 12.8 01/23/2024   HCT 39.1 01/23/2024   MCV 85.7 01/23/2024   MCH 28.1 01/23/2024   RDW 15.4 01/23/2024   PLT 275 01/23/2024   Last metabolic panel Lab Results  Component Value Date   GLUCOSE 118 (H) 01/23/2024   NA 140 01/23/2024   K 3.6 01/23/2024   CL 110 01/23/2024   CO2 25 01/23/2024   BUN 15 01/23/2024   CREATININE 0.94 01/23/2024   GFRNONAA >60 01/23/2024   CALCIUM  9.1 01/23/2024   PROT 7.3 05/23/2023   ALBUMIN 4.3 05/23/2023   LABGLOB 3.0 05/23/2023   BILITOT 0.4 05/23/2023   ALKPHOS 67 05/23/2023   AST 17 05/23/2023   ALT 14 05/23/2023   ANIONGAP 5 01/23/2024  Last lipids Lab Results  Component Value Date   CHOL 158 05/23/2023   HDL 59 05/23/2023   LDLCALC 84 05/23/2023   TRIG 82 05/23/2023   CHOLHDL 2.7 05/23/2023   Last hemoglobin A1c Lab Results  Component Value Date   HGBA1C 7.6 (H) 01/16/2024   Last thyroid  functions Lab Results  Component Value Date   TSH 1.150 05/23/2023   Last vitamin D  Lab Results  Component Value Date   VD25OH 26.8 (L) 01/16/2024      The ASCVD Risk score (Arnett DK, et al., 2019) failed to calculate for the following reasons:   Risk score cannot be calculated because patient has a medical history suggesting prior/existing ASCVD    Assessment & Plan:   Problem List Items Addressed This Visit       Cardiovascular and Mediastinum   Essential hypertension - Primary (Chronic)   Adequately controlled on current antihypertensive regimen.  No  medication changes are indicated today.      Relevant Medications   amLODipine  (NORVASC ) 5 MG tablet   atorvastatin  (LIPITOR) 80 MG tablet   lisinopril  (ZESTRIL ) 40 MG tablet   Heart failure with improved ejection fraction (HFimpEF) (HCC) (Chronic)   Echo from June 2025 showed EF of 65 to 70%.  She remains under the care of cardiology for this.      Relevant Medications   amLODipine  (NORVASC ) 5 MG tablet   atorvastatin  (LIPITOR) 80 MG tablet   lisinopril  (ZESTRIL ) 40 MG tablet     Endocrine   Type 2 diabetes mellitus without complication (HCC)   A1c 6.4 on labs from October 2024.  Current dose of Ozempic  increased to 2 mg.  She is tolerating this well.  She is additionally on Jardiance  25 mg daily and metformin  XR 500 mg twice daily.  She checks her blood sugar regularly and reports readings that are consistently well within goal.  Repeat A1c ordered today.      Relevant Medications   atorvastatin  (LIPITOR) 80 MG tablet   lisinopril  (ZESTRIL ) 40 MG tablet   metFORMIN  (GLUCOPHAGE -XR) 500 MG 24 hr tablet   Semaglutide , 2 MG/DOSE, 8 MG/3ML SOPN   Other Relevant Orders   Bayer DCA Hb A1c Waived   Hyperlipidemia associated with type 2 diabetes mellitus (HCC)   Lipid panel updated in October.  Total cholesterol 158 and LDL 84.  She remains on atorvastatin  80 mg daily.  No medication changes made today.  Will repeat lipid panel at next office visit.      Relevant Medications   amLODipine  (NORVASC ) 5 MG tablet   atorvastatin  (LIPITOR) 80 MG tablet   lisinopril  (ZESTRIL ) 40 MG tablet   metFORMIN  (GLUCOPHAGE -XR) 500 MG 24 hr tablet   Semaglutide , 2 MG/DOSE, 8 MG/3ML SOPN     Nervous and Auditory   Neuropathy   Symptoms remain well-controlled with gabapentin  and Cymbalta .  Cymbalta  30 mg refilled today.      Relevant Medications   DULoxetine  (CYMBALTA ) 30 MG capsule     Other   Obesity, Class III, BMI 40-49.9 (morbid obesity)   She is not significant weight loss with Ozempic .   Will increase dose to 2 mg.      Relevant Medications   metFORMIN  (GLUCOPHAGE -XR) 500 MG 24 hr tablet   Semaglutide , 2 MG/DOSE, 8 MG/3ML SOPN   Other Visit Diagnoses       Pain in left foot       Obtain x-ray of left foot for further evaluation of recent injury.  Relevant Orders   DG Foot Complete Left       Return in about 6 months (around 09/30/2024) for chronic follow-up with PCP.    Leita Longs, FNP

## 2024-03-31 ENCOUNTER — Telehealth: Payer: Self-pay

## 2024-03-31 LAB — BAYER DCA HB A1C WAIVED: HB A1C (BAYER DCA - WAIVED): 7.9 % — ABNORMAL HIGH (ref 4.8–5.6)

## 2024-03-31 NOTE — Assessment & Plan Note (Signed)
 Adequately controlled on current antihypertensive regimen.  No medication changes are indicated today.

## 2024-03-31 NOTE — Assessment & Plan Note (Signed)
 Lipid panel updated in October.  Total cholesterol 158 and LDL 84.  She remains on atorvastatin  80 mg daily.  No medication changes made today.  Will repeat lipid panel at next office visit.

## 2024-03-31 NOTE — Assessment & Plan Note (Signed)
 She is not significant weight loss with Ozempic .  Will increase dose to 2 mg.

## 2024-03-31 NOTE — Assessment & Plan Note (Signed)
 Echo from June 2025 showed EF of 65 to 70%.  She remains under the care of cardiology for this.

## 2024-03-31 NOTE — Assessment & Plan Note (Addendum)
 A1c 6.4 on labs from October 2024.  Current dose of Ozempic  increased to 2 mg.  She is tolerating this well.  She is additionally on Jardiance  25 mg daily and metformin  XR 500 mg twice daily.  She checks her blood sugar regularly and reports readings that are consistently well within goal.  Repeat A1c ordered today.

## 2024-03-31 NOTE — Telephone Encounter (Signed)
 [facility] forms Noted Copied Sleeved  Original placed in provider box Copy placed front desk folder  To be fax to [phone] [facility]  And call patient at [phone] to pick up a copy

## 2024-03-31 NOTE — Assessment & Plan Note (Signed)
 Symptoms remain well-controlled with gabapentin  and Cymbalta .  Cymbalta  30 mg refilled today.

## 2024-04-02 ENCOUNTER — Telehealth: Payer: Self-pay

## 2024-04-02 NOTE — Telephone Encounter (Signed)
 Copied from CRM [id number]. Topic: General - Other >> Apr 01, 2024  2:11 PM Roselie BROCKS wrote: Reason for CRM: Patient is insistent a  nurse gives her a call back. She would not provide any information into what is needed

## 2024-04-02 NOTE — Telephone Encounter (Signed)
 Signed Faxed Sent to scan  copy at my desk

## 2024-04-02 NOTE — Telephone Encounter (Signed)
 Pt states she has medicare and medicaid

## 2024-04-02 NOTE — Telephone Encounter (Signed)
 Attempted to call pt left voicemail to call back

## 2024-04-11 ENCOUNTER — Ambulatory Visit: Payer: Self-pay

## 2024-04-12 ENCOUNTER — Telehealth: Payer: Self-pay

## 2024-04-12 NOTE — Telephone Encounter (Signed)
 Copied from CRM [id number]. Topic: Clinical - Lab/Test Results >> Apr 12, 2024 11:56 AM Donna BRAVO wrote: Reason for CRM: patient returning call from [facility] regarding labs

## 2024-04-12 NOTE — Telephone Encounter (Signed)
 Lmtrc

## 2024-04-13 ENCOUNTER — Telehealth: Payer: Self-pay

## 2024-04-13 NOTE — Telephone Encounter (Signed)
 Pt called and made aware of test results and that her papers have been faxed and scanned into her chart

## 2024-04-13 NOTE — Telephone Encounter (Signed)
 Copied from CRM [id number]. Topic: Clinical - Lab/Test Results >> Apr 13, 2024 10:06 AM Crystal Snow wrote: Reason for CRM: Patient returning call from [facility] for lab results. Is requesting a callback.  Patient can be reached at [phone]

## 2024-04-13 NOTE — Telephone Encounter (Signed)
 Pt returned call per her voicemail, please return call.

## 2024-04-15 ENCOUNTER — Telehealth: Payer: Self-pay

## 2024-04-16 NOTE — Telephone Encounter (Signed)
 Crystal Snow

## 2024-04-20 NOTE — Telephone Encounter (Signed)
 Patient requesting forms to be resent to [phone]. Thanks.

## 2024-04-26 NOTE — Telephone Encounter (Signed)
 Called pt and she stated that they have received it and it has been taken care of

## 2024-07-19 ENCOUNTER — Ambulatory Visit

## 2024-07-19 VITALS — BP 126/82 | HR 93 | Ht 62.0 in | Wt 226.0 lb

## 2024-07-19 DIAGNOSIS — E119 Type 2 diabetes mellitus without complications: Secondary | ICD-10-CM

## 2024-07-19 MED ORDER — SHARPS CONTAINER MISC: 1.0000 | 5 refills | Status: AC | PRN

## 2024-07-19 NOTE — Progress Notes (Signed)
 Established Patient Office Visit  Subjective   Patient ID: [name], female    DOB: 1960-10-16  Age: 63 y.o. MRN: [id number]  Chief Complaint  Patient presents with   Medical Management of Chronic Issues    6 month follow up    HPI Discussed the use of AI scribe software for clinical note transcription with the patient, who gave verbal consent to proceed.  History of Present Illness    Crystal Snow is a 63 year old female with hypertension and diabetes who presents for a follow-up visit to monitor her blood pressure and diabetes management.  Hypertension and edema - No current symptoms related to hypertension - No swelling in her feet  Diabetes mellitus management - Last hemoglobin A1c was 7.9 - Uses needles once a week, specifically on Mondays - Requests an additional sharps container for needle disposal  Medication and prescription management - No need for medication refills - Continues to use [facility] for prescriptions  Weight and nutrition - Attributes current weight to 'eating good'     Patient Active Problem List   Diagnosis Date Noted   Vasovagal syncope 01/23/2024   Vitamin D  deficiency 01/16/2024   Loose stools 05/23/2023   Heart failure with improved ejection fraction (HFimpEF) (HCC) 01/29/2023   Hyperlipidemia associated with type 2 diabetes mellitus (HCC) 11/19/2022   CKD (chronic kidney disease), stage II 11/19/2022   Neuropathy 11/19/2022   Mild aortic stenosis 11/19/2022   Left-sided weakness 01/25/2015   TIA (transient ischemic attack) 01/23/2015   Cardiomyopathy (HCC) 04/13/2014   History of CVA (cerebrovascular accident) 03/24/2014   Hypokalemia 03/24/2014   Obesity, Class III, BMI 40-49.9 (morbid obesity) (HCC) 03/24/2014   Type 2 diabetes mellitus without complication, without long-term current use of insulin  (HCC) 11/30/2007   Essential hypertension 11/30/2007      ROS    Objective:     BP 126/82    Pulse 93   Ht 5' 2 (1.575 m)   Wt 226 lb (102.5 kg)   SpO2 93%   BMI 41.34 kg/m  BP Readings from Last 3 Encounters:  07/19/24 126/82  03/30/24 135/83  01/23/24 (!) 142/90   Wt Readings from Last 3 Encounters:  07/19/24 226 lb (102.5 kg)  03/30/24 224 lb (101.6 kg)  01/23/24 215 lb (97.5 kg)     Physical Exam Vitals and nursing note reviewed.  Constitutional:      Appearance: Normal appearance. She is obese.  HENT:     Head: Normocephalic.  Eyes:     Extraocular Movements: Extraocular movements intact.     Pupils: Pupils are equal, round, and reactive to light.  Cardiovascular:     Rate and Rhythm: Normal rate and regular rhythm.  Pulmonary:     Effort: Pulmonary effort is normal.     Breath sounds: Normal breath sounds.  Musculoskeletal:     Cervical back: Normal range of motion and neck supple.  Neurological:     Mental Status: She is alert and oriented to person, place, and time.  Psychiatric:        Mood and Affect: Mood normal.        Thought Content: Thought content normal.      No results found for any visits on 07/19/24.    The ASCVD Risk score (Arnett DK, et al., 2019) failed to calculate for the following reasons:   Risk score cannot be calculated because patient has a medical history suggesting prior/existing ASCVD    Assessment &  Plan:   Problem List Items Addressed This Visit       Endocrine   Type 2 diabetes mellitus without complication, without long-term current use of insulin  (HCC) - Primary   A1c of 7.9 indicated suboptimal glycemic control. - Rechecked A1c. - Obtained urine specimen to assess kidney function.      Relevant Medications   sharps container   Other Relevant Orders   Basic Metabolic Panel (BMET)   HgB A1c   Urine Microalbumin w/creat. ratio    Return in about 4 months (around 11/17/2024) for chronic follow-up with PCP.    Leita Longs, FNP

## 2024-07-19 NOTE — Assessment & Plan Note (Signed)
 A1c of 7.9 indicated suboptimal glycemic control. - Rechecked A1c. - Obtained urine specimen to assess kidney function.

## 2024-07-21 LAB — HEMOGLOBIN A1C
Est. average glucose Bld gHb Est-mCnc: 183 mg/dL
Hgb A1c MFr Bld: 8 % — ABNORMAL HIGH (ref 4.8–5.6)

## 2024-07-21 LAB — MICROALBUMIN / CREATININE URINE RATIO
Creatinine, Urine: 76.8 mg/dL
Microalb/Creat Ratio: 102 mg/g{creat} — AB (ref 0–29)
Microalbumin, Urine: 78.6 ug/mL

## 2024-07-21 LAB — BASIC METABOLIC PANEL WITH GFR
BUN/Creatinine Ratio: 16 (ref 12–28)
BUN: 16 mg/dL (ref 8–27)
CO2: 20 mmol/L (ref 20–29)
Calcium: 9.6 mg/dL (ref 8.7–10.3)
Chloride: 107 mmol/L — ABNORMAL HIGH (ref 96–106)
Creatinine, Ser: 0.99 mg/dL (ref 0.57–1.00)
Glucose: 209 mg/dL — ABNORMAL HIGH (ref 70–99)
Potassium: 3.5 mmol/L (ref 3.5–5.2)
Sodium: 144 mmol/L (ref 134–144)
eGFR: 63 mL/min/1.73 (ref >59)

## 2024-08-03 NOTE — Progress Notes (Signed)
 Crystal Snow                                          MRN: [id number]   08/03/2024   The [facility] Quality Team Specialist reviewed this patient medical record for the purposes of chart review for care gap closure. The following were reviewed: chart review for care gap closure-kidney health evaluation for diabetes:eGFR  and uACR.    [facility] Quality Team

## 2024-09-09 ENCOUNTER — Other Ambulatory Visit: Payer: Self-pay

## 2024-12-06 ENCOUNTER — Ambulatory Visit
